# Patient Record
Sex: Female | Born: 1945 | Race: White | Hispanic: No | Smoking: Never smoker
Health system: Southern US, Community
[De-identification: ages and names within clinical notes are randomized; demographics above are authoritative.]

## PROBLEM LIST (undated history)

## (undated) DIAGNOSIS — C801 Malignant (primary) neoplasm, unspecified: Secondary | ICD-10-CM

## (undated) DIAGNOSIS — C50919 Malignant neoplasm of unspecified site of unspecified female breast: Secondary | ICD-10-CM

## (undated) DIAGNOSIS — M1990 Unspecified osteoarthritis, unspecified site: Secondary | ICD-10-CM

## (undated) DIAGNOSIS — N390 Urinary tract infection, site not specified: Secondary | ICD-10-CM

## (undated) DIAGNOSIS — F419 Anxiety disorder, unspecified: Secondary | ICD-10-CM

## (undated) DIAGNOSIS — E079 Disorder of thyroid, unspecified: Secondary | ICD-10-CM

## (undated) DIAGNOSIS — M810 Age-related osteoporosis without current pathological fracture: Secondary | ICD-10-CM

## (undated) HISTORY — DX: Disorder of thyroid, unspecified: E07.9

## (undated) HISTORY — DX: Age-related osteoporosis without current pathological fracture: M81.0

## (undated) HISTORY — PX: TUBAL LIGATION: SHX77

## (undated) HISTORY — DX: Anxiety disorder, unspecified: F41.9

## (undated) HISTORY — PX: TONSILLECTOMY: SUR1361

## (undated) HISTORY — DX: Malignant neoplasm of unspecified site of unspecified female breast: C50.919

## (undated) HISTORY — PX: COLONOSCOPY: SHX174

## (undated) HISTORY — DX: Urinary tract infection, site not specified: N39.0

## (undated) HISTORY — DX: Malignant (primary) neoplasm, unspecified: C80.1

---

## 1991-10-05 DIAGNOSIS — C50919 Malignant neoplasm of unspecified site of unspecified female breast: Secondary | ICD-10-CM

## 1991-10-05 HISTORY — DX: Malignant neoplasm of unspecified site of unspecified female breast: C50.919

## 1991-10-05 HISTORY — PX: BREAST SURGERY: SHX581

## 2008-04-24 ENCOUNTER — Other Ambulatory Visit: Admission: RE | Admit: 2008-04-24 | Discharge: 2008-04-24 | Payer: Self-pay | Admitting: Internal Medicine

## 2008-07-26 ENCOUNTER — Encounter: Admission: RE | Admit: 2008-07-26 | Discharge: 2008-07-26 | Payer: Self-pay | Admitting: Internal Medicine

## 2010-06-16 ENCOUNTER — Encounter: Admission: RE | Admit: 2010-06-16 | Discharge: 2010-06-16 | Payer: Self-pay | Admitting: Obstetrics and Gynecology

## 2012-10-08 ENCOUNTER — Ambulatory Visit: Payer: Managed Care, Other (non HMO) | Admitting: Internal Medicine

## 2012-10-08 VITALS — BP 122/80 | HR 117 | Temp 98.4°F | Resp 16 | Ht 66.0 in | Wt 154.8 lb

## 2012-10-08 DIAGNOSIS — IMO0001 Reserved for inherently not codable concepts without codable children: Secondary | ICD-10-CM

## 2012-10-08 DIAGNOSIS — R350 Frequency of micturition: Secondary | ICD-10-CM

## 2012-10-08 DIAGNOSIS — N390 Urinary tract infection, site not specified: Secondary | ICD-10-CM

## 2012-10-08 DIAGNOSIS — R319 Hematuria, unspecified: Secondary | ICD-10-CM

## 2012-10-08 LAB — POCT URINALYSIS DIPSTICK
Bilirubin, UA: NEGATIVE
Glucose, UA: NEGATIVE
Ketones, UA: NEGATIVE
Nitrite, UA: NEGATIVE
Protein, UA: NEGATIVE
Spec Grav, UA: <=1.005
pH, UA: 6

## 2012-10-08 LAB — POCT UA - MICROSCOPIC ONLY: Yeast, UA: NEGATIVE

## 2012-10-08 MED ORDER — NITROFURANTOIN MONOHYD MACRO 100 MG PO CAPS: 100.0000 mg | ORAL_CAPSULE | Freq: Two times a day (BID) | ORAL | Status: DC

## 2012-10-08 NOTE — Progress Notes (Signed)
  Subjective:    Patient ID: Lydia Roberts, female    DOB: [birth year], 67 y.o.   MRN: [id number]  HPI 2weeks ago pain pelvis =pressure with u freq/suspected a bladder infection which she has had before [place] helped Then today same again, urinary frequency with suprapubic pressure pain/no dysuria/no vaginal discharge  Past medical history-hypothyroid/hyperlipidemia  Review of Systems No fever chills or night sweats/no recent weight loss No flank pain No upper respiratory symptoms   no nausea vomiting diarrhea or constipation Objective:   Physical Exam No acute distress Normal vital signs Abdomen soft. No organomegaly. Mild tenderness in the suprapubic area with negative rebound and negative percussion       Results for orders placed in visit on 10/08/12  POCT URINALYSIS DIPSTICK      Component Value Range   Color, UA pale     Clarity, UA clear     Glucose, UA neg     Bilirubin, UA neg     Ketones, UA neg     Spec Grav, UA <=1.005     Blood, UA small     pH, UA 6.0     Protein, UA neg     Urobilinogen, UA 0.2     Nitrite, UA neg     Leukocytes, UA small (1+)    POCT UA - MICROSCOPIC ONLY      Component Value Range   WBC, Ur, HPF, POC 3-4     RBC, urine, microscopic tntc     Bacteria, U Microscopic 1+     Mucus, UA neg     Epithelial cells, urine per micros neg     Crystals, Ur, HPF, POC neg     Casts, Ur, LPF, POC neg     Yeast, UA neg      Assessment & Plan:   problem #1 hematuria Problem #2 urinary frequency Problem #3 suprapubic pressure  Plan-urine culture Start Macrobid If culture negative will need cystoscopy/CT to evaluate hematuria

## 2012-10-09 LAB — URINE CULTURE

## 2012-10-12 ENCOUNTER — Telehealth: Payer: Self-pay

## 2012-10-12 DIAGNOSIS — R319 Hematuria, unspecified: Secondary | ICD-10-CM

## 2012-10-12 NOTE — Telephone Encounter (Signed)
PATIENT CALLING FOR HER LAB RESULTS - PROMISED ON Tuesday  CBN:  [phone]

## 2012-10-12 NOTE — Telephone Encounter (Signed)
Notes Recorded by Tonye Pearson, MD on 10/10/2012 at 9:49 PM Please call/the urine culture did not establish an infection causing her hematuria. She needs urology evaluation to determine the cause of the blood in her urine. Can we set this up?   Called patient to advise. She is agreeable to the urology referral. She asks for [place] to send records to her PCP Dr Thea Silversmith, this is done.

## 2012-10-19 ENCOUNTER — Emergency Department (HOSPITAL_COMMUNITY)
Admission: EM | Admit: 2012-10-19 | Discharge: 2012-10-20 | Disposition: A | Discharge status: Home / Self Care | Payer: Managed Care, Other (non HMO) | Attending: Emergency Medicine | Admitting: Emergency Medicine

## 2012-10-19 ENCOUNTER — Encounter (HOSPITAL_COMMUNITY): Payer: Self-pay | Admitting: *Deleted

## 2012-10-19 ENCOUNTER — Emergency Department (HOSPITAL_COMMUNITY): Payer: Managed Care, Other (non HMO)

## 2012-10-19 DIAGNOSIS — Z79899 Other long term (current) drug therapy: Secondary | ICD-10-CM | POA: Insufficient documentation

## 2012-10-19 DIAGNOSIS — R109 Unspecified abdominal pain: Secondary | ICD-10-CM | POA: Insufficient documentation

## 2012-10-19 DIAGNOSIS — R110 Nausea: Secondary | ICD-10-CM | POA: Insufficient documentation

## 2012-10-19 DIAGNOSIS — Z8589 Personal history of malignant neoplasm of other organs and systems: Secondary | ICD-10-CM | POA: Insufficient documentation

## 2012-10-19 DIAGNOSIS — E079 Disorder of thyroid, unspecified: Secondary | ICD-10-CM | POA: Insufficient documentation

## 2012-10-19 DIAGNOSIS — Z8739 Personal history of other diseases of the musculoskeletal system and connective tissue: Secondary | ICD-10-CM | POA: Insufficient documentation

## 2012-10-19 LAB — CBC WITH DIFFERENTIAL/PLATELET
Eosinophils Absolute: 0 10*3/uL (ref 0.0–0.7)
Eosinophils Relative: 1 % (ref 0–5)
Lymphocytes Relative: 44 % (ref 12–46)
MCH: 29.6 pg (ref 26.0–34.0)
Neutro Abs: 1.6 10*3/uL — ABNORMAL LOW (ref 1.7–7.7)
Platelets: 173 10*3/uL (ref 150–400)
RBC: 3.95 MIL/uL (ref 3.87–5.11)
RDW: 13.9 % (ref 11.5–15.5)

## 2012-10-19 LAB — URINALYSIS, ROUTINE W REFLEX MICROSCOPIC
Bilirubin Urine: NEGATIVE
Ketones, ur: NEGATIVE mg/dL
Nitrite: NEGATIVE
Specific Gravity, Urine: 1.012 (ref 1.005–1.030)

## 2012-10-19 LAB — URINE MICROSCOPIC-ADD ON

## 2012-10-19 LAB — LIPASE, BLOOD: Lipase: 45 U/L (ref 11–59)

## 2012-10-19 LAB — COMPREHENSIVE METABOLIC PANEL
ALT: 13 U/L (ref 0–35)
AST: 19 U/L (ref 0–37)
Albumin: 3.8 g/dL (ref 3.5–5.2)
Alkaline Phosphatase: 65 U/L (ref 39–117)
BUN: 17 mg/dL (ref 6–23)
CO2: 28 mEq/L (ref 19–32)
Creatinine, Ser: 0.99 mg/dL (ref 0.50–1.10)
Total Protein: 7 g/dL (ref 6.0–8.3)

## 2012-10-19 MED ORDER — KETOROLAC TROMETHAMINE 30 MG/ML IJ SOLN
30.0000 mg | Freq: Once | INTRAMUSCULAR | Status: DC
  Filled 2012-10-19: qty 1

## 2012-10-19 NOTE — ED Provider Notes (Signed)
History     CSN: [id number]  Arrival date & time 10/19/12  2102   First MD Initiated Contact with Patient 10/19/12 2215      Chief Complaint  Patient presents with  . Flank Pain    (Consider location/radiation/quality/duration/timing/severity/associated sxs/prior treatment) Patient is a 67 y.o. female presenting with flank pain. The history is provided by the patient.  Flank Pain  She has been having pain in her right flank with radiation to her right abdomen for the last 3 weeks. Pain is intermittent lasting variable amounts of time. There is associated nausea but no vomiting. She denies dysuria or urinary urgency or frequency or tenesmus. She denies constipation or diarrhea. Nothing makes the pain better nothing makes it worse. She has not taken any medication for it. Pain was worse today than it had been previously. Pain, when present, is moderate to moderately severe. She states the worst pain was 6/10. She had been seen in urgent care Center on January 5 and placed on nitrofurantoin but was told to discontinue that when culture did not show any infection. She does have the bladder with a urologist on February 14. She states that the urine sample at the urgent care Center did have blood in it.  Past Medical History  Diagnosis Date  . Thyroid disease   . Osteoporosis   . Cancer     Past Surgical History  Procedure Date  . Breast surgery   . Tonsillectomy     Family History  Problem Relation Age of Onset  . Diabetes Sister   . Diabetes Brother   . Diabetes Brother   . Thyroid disease Daughter     History  Substance Use Topics  . Smoking status: Never Smoker   . Smokeless tobacco: Not on file  . Alcohol Use: No    OB History    Grav Para Term Preterm Abortions TAB SAB Ect Mult Living                  Review of Systems  Genitourinary: Positive for flank pain.  All other systems reviewed and are negative.    Allergies  Sulfur  Home Medications   Current  Outpatient Rx  Name  Route  Sig  Dispense  Refill  . CALCIUM CARBONATE-VITAMIN D 500-200 MG-UNIT PO TABS   Oral   Take 1 tablet by mouth daily.         Marland Kitchen VITAMIN D 1000 UNITS PO TABS   Oral   Take 1,000 Units by mouth daily.         Marland Kitchen LEVOTHYROXINE SODIUM 125 MCG PO TABS   Oral   Take 125 mcg by mouth daily.         Marland Kitchen PRAVASTATIN SODIUM 20 MG PO TABS   Oral   Take 20 mg by mouth daily.         Marland Kitchen NITROFURANTOIN MONOHYD MACRO 100 MG PO CAPS   Oral   Take 1 capsule (100 mg total) by mouth 2 (two) times daily.   1 capsule   14     BP 137/59  Pulse 60  Temp 98.3 F (36.8 C) (Oral)  Resp 16  SpO2 97%  Physical Exam  Nursing note and vitals reviewed.  67 year old female, resting comfortably and in no acute distress. Vital signs are normal. Oxygen saturation is 97%, which is normal. Head is normocephalic and atraumatic. PERRLA, EOMI. Oropharynx is clear. Neck is nontender and supple without adenopathy or JVD. Back  is nontender and there is no CVA tenderness. Lungs are clear without rales, wheezes, or rhonchi. Chest is nontender. Heart has regular rate and rhythm without murmur. Abdomen is soft, flat, nontender without masses or hepatosplenomegaly and peristalsis is normoactive. Extremities have no cyanosis or edema, full range of motion is present. Skin is warm and dry without rash. Neurologic: Mental status is normal, cranial nerves are intact, there are no motor or sensory deficits.  ED Course  Procedures (including critical care time)  Results for orders placed during the hospital encounter of 10/19/12  URINALYSIS, ROUTINE W REFLEX MICROSCOPIC      Component Value Range   Color, Urine YELLOW  YELLOW   APPearance CLEAR  CLEAR   Specific Gravity, Urine 1.012  1.005 - 1.030   pH 6.0  5.0 - 8.0   Glucose, UA NEGATIVE  NEGATIVE mg/dL   Hgb urine dipstick TRACE (*) NEGATIVE   Bilirubin Urine NEGATIVE  NEGATIVE   Ketones, ur NEGATIVE  NEGATIVE mg/dL    Protein, ur NEGATIVE  NEGATIVE mg/dL   Urobilinogen, UA 0.2  0.0 - 1.0 mg/dL   Nitrite NEGATIVE  NEGATIVE   Leukocytes, UA NEGATIVE  NEGATIVE  CBC WITH DIFFERENTIAL      Component Value Range   WBC 4.0  4.0 - 10.5 K/uL   RBC 3.95  3.87 - 5.11 MIL/uL   Hemoglobin 11.7 (*) 12.0 - 15.0 g/dL   HCT 16.1 (*) 09.6 - 04.5 %   MCV 87.8  78.0 - 100.0 fL   MCH 29.6  26.0 - 34.0 pg   MCHC 33.7  30.0 - 36.0 g/dL   RDW 40.9  81.1 - 91.4 %   Platelets 173  150 - 400 K/uL   Neutrophils Relative 40 (*) 43 - 77 %   Neutro Abs 1.6 (*) 1.7 - 7.7 K/uL   Lymphocytes Relative 44  12 - 46 %   Lymphs Abs 1.8  0.7 - 4.0 K/uL   Monocytes Relative 14 (*) 3 - 12 %   Monocytes Absolute 0.6  0.1 - 1.0 K/uL   Eosinophils Relative 1  0 - 5 %   Eosinophils Absolute 0.0  0.0 - 0.7 K/uL   Basophils Relative 1  0 - 1 %   Basophils Absolute 0.0  0.0 - 0.1 K/uL  COMPREHENSIVE METABOLIC PANEL      Component Value Range   Sodium 138  135 - 145 mEq/L   Potassium 3.9  3.5 - 5.1 mEq/L   Chloride 102  96 - 112 mEq/L   CO2 28  19 - 32 mEq/L   Glucose, Bld 92  70 - 99 mg/dL   BUN 17  6 - 23 mg/dL   Creatinine, Ser 7.82  0.50 - 1.10 mg/dL   Calcium 95.6  8.4 - 21.3 mg/dL   Total Protein 7.0  6.0 - 8.3 g/dL   Albumin 3.8  3.5 - 5.2 g/dL   AST 19  0 - 37 U/L   ALT 13  0 - 35 U/L   Alkaline Phosphatase 65  39 - 117 U/L   Total Bilirubin 1.3 (*) 0.3 - 1.2 mg/dL   GFR calc non Af Amer 58 (*) >90 mL/min   GFR calc Af Amer 67 (*) >90 mL/min  LIPASE, BLOOD      Component Value Range   Lipase 45  11 - 59 U/L  URINE MICROSCOPIC-ADD ON      Component Value Range   Squamous Epithelial / LPF RARE  RARE   Bacteria, UA RARE  RARE   Ct Abdomen Pelvis Wo Contrast  10/19/2012  *RADIOLOGY REPORT*  Clinical Data: Right flank pain, hematuria.  CT ABDOMEN AND PELVIS WITHOUT CONTRAST  Technique:  Multidetector CT imaging of the abdomen and pelvis was performed following the standard protocol without intravenous contrast.  Comparison:  None.  Findings: Visualized lung bases clear.  18 mm probable cyst in the medial left hepatic segment.  Unremarkable noncontrast evaluation of nondistended gallbladder, spleen, adrenal glands, kidneys, pancreas.  No nephrolithiasis or hydronephrosis. Patchy aortoiliac arterial calcifications without aneurysm.  Stomach, small bowel, and colon are nondilated.  Normal appendix.  Multiple sigmoid diverticula without significant adjacent inflammatory/edematous change.  Urinary bladder incompletely distended.  Uterus and adnexal regions grossly unremarkable.  No ascites.  No free air. No adenopathy localized.   Bilateral pelvic phleboliths.  IMPRESSION:  1.  Negative for nephrolithiasis or hydronephrosis. 2.  Normal appendix. 3.  Sigmoid diverticulosis.   Original Report Authenticated By: D. Andria Rhein, MD       1. Right flank pain       MDM  Intermittent right flank pain of uncertain cause. Old records are reviewed and the record from her urgent care visit showed urinalysis with too numerous to count RBCs and 3-4 WBCs. Culture showed 25,000 CFU's with multiple organisms. She will be sent for CT to evaluate for possible ureterolithiasis and she will be given a dose of ketorolac.  Workup is unremarkable. She has not had any further episodes of pain since taking ketorolac. She will be discharged and advised to followup with her urologist as scheduled. I have suggested that she continue taking naproxen to see if keeping NSAID's in her system would help her pain.      Dione Booze, MD 10/19/12 [phone]

## 2012-10-19 NOTE — ED Notes (Signed)
Pt reports right flank pain that comes and goes with pain down her right leg with nausea on and off for about three weeks.

## 2012-10-19 NOTE — ED Notes (Signed)
Patient was seen at urgent care on January 5th.   She was informed that she has blood in her urine.

## 2012-10-20 NOTE — ED Notes (Signed)
Patient is alert and oriented x3.  She was given DC instructions and follow up visit instructions.  Patient gave verbal understanding. She was DC ambulatory under her own power to home.  V/S stable.  SHe was not showing any signs of distress on DC 

## 2012-10-20 NOTE — ED Notes (Signed)
riv

## 2013-10-25 ENCOUNTER — Encounter (HOSPITAL_COMMUNITY): Payer: Self-pay | Admitting: Emergency Medicine

## 2013-10-25 ENCOUNTER — Emergency Department (HOSPITAL_COMMUNITY): Payer: Managed Care, Other (non HMO)

## 2013-10-25 ENCOUNTER — Emergency Department (HOSPITAL_COMMUNITY)
Admission: EM | Admit: 2013-10-25 | Discharge: 2013-10-25 | Disposition: A | Discharge status: Home / Self Care | Payer: Managed Care, Other (non HMO) | Attending: Emergency Medicine | Admitting: Emergency Medicine

## 2013-10-25 DIAGNOSIS — W19XXXA Unspecified fall, initial encounter: Secondary | ICD-10-CM

## 2013-10-25 DIAGNOSIS — N390 Urinary tract infection, site not specified: Secondary | ICD-10-CM

## 2013-10-25 DIAGNOSIS — Z8739 Personal history of other diseases of the musculoskeletal system and connective tissue: Secondary | ICD-10-CM | POA: Insufficient documentation

## 2013-10-25 DIAGNOSIS — Y9289 Other specified places as the place of occurrence of the external cause: Secondary | ICD-10-CM | POA: Insufficient documentation

## 2013-10-25 DIAGNOSIS — Z043 Encounter for examination and observation following other accident: Secondary | ICD-10-CM | POA: Insufficient documentation

## 2013-10-25 DIAGNOSIS — Z853 Personal history of malignant neoplasm of breast: Secondary | ICD-10-CM | POA: Insufficient documentation

## 2013-10-25 DIAGNOSIS — Y939 Activity, unspecified: Secondary | ICD-10-CM | POA: Insufficient documentation

## 2013-10-25 DIAGNOSIS — F411 Generalized anxiety disorder: Secondary | ICD-10-CM | POA: Insufficient documentation

## 2013-10-25 DIAGNOSIS — Z79899 Other long term (current) drug therapy: Secondary | ICD-10-CM | POA: Insufficient documentation

## 2013-10-25 DIAGNOSIS — E079 Disorder of thyroid, unspecified: Secondary | ICD-10-CM | POA: Insufficient documentation

## 2013-10-25 DIAGNOSIS — R296 Repeated falls: Secondary | ICD-10-CM | POA: Insufficient documentation

## 2013-10-25 LAB — URINALYSIS, ROUTINE W REFLEX MICROSCOPIC
BILIRUBIN URINE: NEGATIVE
Glucose, UA: NEGATIVE mg/dL
Ketones, ur: 15 mg/dL — AB
NITRITE: NEGATIVE
Protein, ur: 30 mg/dL — AB
SPECIFIC GRAVITY, URINE: 1.013 (ref 1.005–1.030)
UROBILINOGEN UA: 0.2 mg/dL (ref 0.0–1.0)
pH: 5.5 (ref 5.0–8.0)

## 2013-10-25 LAB — CBC WITH DIFFERENTIAL/PLATELET
Basophils Absolute: 0 10*3/uL (ref 0.0–0.1)
Basophils Relative: 1 % (ref 0–1)
Eosinophils Absolute: 0 10*3/uL (ref 0.0–0.7)
Eosinophils Relative: 0 % (ref 0–5)
HEMATOCRIT: 38.7 % (ref 36.0–46.0)
Hemoglobin: 13.1 g/dL (ref 12.0–15.0)
Lymphocytes Relative: 9 % — ABNORMAL LOW (ref 12–46)
Lymphs Abs: 0.6 10*3/uL — ABNORMAL LOW (ref 0.7–4.0)
MCH: 29.8 pg (ref 26.0–34.0)
MCHC: 33.9 g/dL (ref 30.0–36.0)
MCV: 88.2 fL (ref 78.0–100.0)
Monocytes Absolute: 1.3 10*3/uL — ABNORMAL HIGH (ref 0.1–1.0)
Monocytes Relative: 20 % — ABNORMAL HIGH (ref 3–12)
NEUTROS PCT: 70 % (ref 43–77)
Neutro Abs: 4.6 10*3/uL (ref 1.7–7.7)
PLATELETS: 131 10*3/uL — AB (ref 150–400)
RBC: 4.39 MIL/uL (ref 3.87–5.11)
RDW: 14.1 % (ref 11.5–15.5)
WBC: 6.6 10*3/uL (ref 4.0–10.5)

## 2013-10-25 LAB — COMPREHENSIVE METABOLIC PANEL
ALBUMIN: 3.5 g/dL (ref 3.5–5.2)
ALK PHOS: 72 U/L (ref 39–117)
ALT: 10 U/L (ref 0–35)
AST: 15 U/L (ref 0–37)
BILIRUBIN TOTAL: 1.7 mg/dL — AB (ref 0.3–1.2)
BUN: 14 mg/dL (ref 6–23)
CALCIUM: 9.4 mg/dL (ref 8.4–10.5)
CHLORIDE: 96 meq/L (ref 96–112)
CO2: 25 mEq/L (ref 19–32)
Creatinine, Ser: 0.94 mg/dL (ref 0.50–1.10)
GFR, EST AFRICAN AMERICAN: 71 mL/min — AB (ref >90)
GFR, EST NON AFRICAN AMERICAN: 61 mL/min — AB (ref >90)
GLUCOSE: 105 mg/dL — AB (ref 70–99)
POTASSIUM: 4.4 meq/L (ref 3.7–5.3)
Sodium: 136 mEq/L — ABNORMAL LOW (ref 137–147)
Total Protein: 7.7 g/dL (ref 6.0–8.3)

## 2013-10-25 LAB — URINE MICROSCOPIC-ADD ON

## 2013-10-25 LAB — POCT I-STAT TROPONIN I: Troponin i, poc: 0 ng/mL (ref 0.00–0.08)

## 2013-10-25 MED ORDER — DEXTROSE 5 % IV SOLN
1.0000 g | Freq: Once | INTRAVENOUS | Status: AC
  Administered 2013-10-25: 1 g via INTRAVENOUS
  Filled 2013-10-25: qty 10

## 2013-10-25 MED ORDER — SODIUM CHLORIDE 0.9 % IV SOLN
1000.0000 mL | Freq: Once | INTRAVENOUS | Status: AC
  Administered 2013-10-25: 1000 mL via INTRAVENOUS

## 2013-10-25 MED ORDER — IOHEXOL 300 MG/ML  SOLN
80.0000 mL | Freq: Once | INTRAMUSCULAR | Status: AC | PRN
  Administered 2013-10-25: 80 mL via INTRAVENOUS

## 2013-10-25 MED ORDER — CEPHALEXIN 500 MG PO CAPS: 500.0000 mg | ORAL_CAPSULE | Freq: Two times a day (BID) | ORAL | Status: DC

## 2013-10-25 MED ORDER — SODIUM CHLORIDE 0.9 % IV SOLN
1000.0000 mL | INTRAVENOUS | Status: DC
  Administered 2013-10-25: 1000 mL via INTRAVENOUS

## 2013-10-25 MED ORDER — LORAZEPAM 2 MG/ML IJ SOLN
1.0000 mg | Freq: Once | INTRAMUSCULAR | Status: AC
  Administered 2013-10-25: 1 mg via INTRAVENOUS
  Filled 2013-10-25: qty 1

## 2013-10-25 NOTE — Discharge Instructions (Signed)
As discussed, it is important that you follow up as soon as possible with your physician for continued management of your condition.  Your evaluation here today has been largely reassuring, but additional evaluation is required to ensure appropriate ongoing care.  If you develop any new, or concerning changes in your condition, please return to the emergency department immediately.

## 2013-10-25 NOTE — ED Provider Notes (Signed)
CSN: [id number]     Arrival date & time 10/25/13  0910 History   First MD Initiated Contact with Patient 10/25/13 [phone]     Chief Complaint  Patient presents with  . Near Syncope    HPI  Patient presents with concern of weakness and fall. History of present illness is per the patient and her husband. They state that over the past months, the patient has had persistent weakness, with new difficulty with fine motor skill. No recent episodes of syncope, no focal pain. Patient does have recent urinary tract infection, for which she was treated with ciprofloxacin. She continues to take all medication as directed, including amitriptyline. Today, just prior to ED evaluation the patient had an episode of weakness with fall. She denies head trauma, complete loss of consciousness. The patient was found on the floor by her husband, not complaining of any focal pain. Though the patient states that she did not lose consciousness, she has incomplete recall of the event   Past Medical History  Diagnosis Date  . Thyroid disease   . Osteoporosis   . Cancer    Past Surgical History  Procedure Laterality Date  . Breast surgery    . Tonsillectomy     Family History  Problem Relation Age of Onset  . Diabetes Sister   . Diabetes Brother   . Diabetes Brother   . Thyroid disease Daughter    History  Substance Use Topics  . Smoking status: Never Smoker   . Smokeless tobacco: Not on file  . Alcohol Use: No   OB History   Grav Para Term Preterm Abortions TAB SAB Ect Mult Living                 Review of Systems  Constitutional:       Per HPI, otherwise negative  HENT:       Per HPI, otherwise negative  Respiratory:       Per HPI, otherwise negative  Cardiovascular:       Per HPI, otherwise negative  Gastrointestinal: Negative for vomiting.  Endocrine:       Negative aside from HPI  Genitourinary:       Neg aside from HPI   Musculoskeletal:       Per HPI, otherwise negative   Skin: Negative for wound.  Neurological: Positive for weakness. Negative for seizures and syncope.  Psychiatric/Behavioral: The patient is nervous/anxious.     Allergies  Sulfur  Home Medications   Current Outpatient Rx  Name  Route  Sig  Dispense  Refill  . amitriptyline (ELAVIL) 25 MG tablet   Oral   Take 25 mg by mouth at bedtime.         . cholecalciferol (VITAMIN D) 1000 UNITS tablet   Oral   Take 1,000 Units by mouth daily.         Marland Kitchen ezetimibe (ZETIA) 10 MG tablet   Oral   Take 10 mg by mouth daily.         Marland Kitchen levothyroxine (SYNTHROID, LEVOTHROID) 125 MCG tablet   Oral   Take 125 mcg by mouth daily.          BP 130/42  Pulse 90  Temp(Src) 98.2 F (36.8 C) (Oral)  Resp 18  Ht 5\' 5"  (1.651 m)  Wt 150 lb (68.04 kg)  BMI 24.96 kg/m2  SpO2 99% Physical Exam  Nursing note and vitals reviewed. Constitutional: She is oriented to person, place, and time. She appears well-developed and  well-nourished. She has a sickly appearance.  HENT:  Head: Normocephalic and atraumatic.  Eyes: Conjunctivae and EOM are normal.  Cardiovascular: Normal rate and regular rhythm.   Pulmonary/Chest: Effort normal and breath sounds normal. No stridor. No respiratory distress.  Abdominal: She exhibits no distension.  Musculoskeletal: She exhibits no edema.  Neurological: She is alert and oriented to person, place, and time. She displays atrophy. She displays no tremor. No cranial nerve deficit or sensory deficit. She exhibits normal muscle tone. She displays no seizure activity. Coordination normal.  Coordination is symmetric, but slow  Skin: Skin is warm and dry.  Psychiatric: She has a normal mood and affect. She is slowed and withdrawn.    ED Course  Procedures (including critical care time) Labs Review Labs Reviewed  CBC WITH DIFFERENTIAL  COMPREHENSIVE METABOLIC PANEL  URINALYSIS, ROUTINE W REFLEX MICROSCOPIC   Imaging Review No results found.  EKG Interpretation     Date/Time:  Thursday October 25 2013 09:24:41 EST Ventricular Rate:  89 PR Interval:  159 QRS Duration: 77 QT Interval:  329 QTC Calculation: 400 R Axis:   87 Text Interpretation:  Sinus rhythm Borderline right axis deviation Sinus rhythm Rightward axis Borderline ECG Confirmed by Carmin Muskrat  MD ([id number]) on 10/25/2013 10:11:24 AM           O2- 99%ra, nml   Update: On repeat exam the patient is ambulatory, seems in no distress. MDM   1. UTI (lower urinary tract infection)   2. Fall     Patient presents after a fall, that occurred after a period of generalized fatigue for several weeks. Exam patient is awake, alert, hemodynamically stable.  With her description of new ataxia, though her physical exam was reassuring, she had labs, radiographic studies performed.  Post results were reassuring, though there is evidence of urinary tract infection.  There is also evidence of dehydration with ketonuria.  Patient received fluid rehydration, initiation of antibiotics.  She now decompensation, nor evidence of distress while here.  Patient was discharged in stable condition to follow up with primary care.   Carmin Muskrat, MD 10/25/13 [phone]

## 2013-10-25 NOTE — ED Notes (Signed)
Pt presents to department via [facility] for evaluation of near syncope. States she was in kitchen this morning when she fell to floor. Doesn't remember exact event. Witnessed by husband. Denies LOC. States she has been very weak x2 weeks. Was recently diagnosed with UTI and started on Cipro by PCP. Also states she recently started taking Amitriptyline for depression. Pt is conscious alert and oriented x4. Denies pain at the time. No neurological deficits noted.

## 2013-10-25 NOTE — ED Notes (Signed)
Pt transported to CT scan and x-ray.

## 2013-10-25 NOTE — ED Notes (Signed)
Pt unable to complete CT scan dt anxiety. MD made aware.

## 2014-01-17 ENCOUNTER — Telehealth: Payer: Self-pay | Admitting: *Deleted

## 2014-01-17 ENCOUNTER — Other Ambulatory Visit (HOSPITAL_COMMUNITY)
Admission: RE | Admit: 2014-01-17 | Discharge: 2014-01-17 | Disposition: A | Discharge status: Home / Self Care | Payer: Managed Care, Other (non HMO) | Source: Ambulatory Visit | Attending: Gynecology | Admitting: Gynecology

## 2014-01-17 ENCOUNTER — Encounter: Payer: Self-pay | Admitting: Gynecology

## 2014-01-17 ENCOUNTER — Ambulatory Visit (INDEPENDENT_AMBULATORY_CARE_PROVIDER_SITE_OTHER): Payer: Managed Care, Other (non HMO) | Admitting: Gynecology

## 2014-01-17 VITALS — BP 114/74 | Ht 66.0 in | Wt 144.0 lb

## 2014-01-17 DIAGNOSIS — N390 Urinary tract infection, site not specified: Secondary | ICD-10-CM

## 2014-01-17 DIAGNOSIS — M810 Age-related osteoporosis without current pathological fracture: Secondary | ICD-10-CM

## 2014-01-17 DIAGNOSIS — Z124 Encounter for screening for malignant neoplasm of cervix: Secondary | ICD-10-CM

## 2014-01-17 DIAGNOSIS — Z1151 Encounter for screening for human papillomavirus (HPV): Secondary | ICD-10-CM | POA: Insufficient documentation

## 2014-01-17 DIAGNOSIS — N952 Postmenopausal atrophic vaginitis: Secondary | ICD-10-CM

## 2014-01-17 DIAGNOSIS — Z780 Asymptomatic menopausal state: Secondary | ICD-10-CM

## 2014-01-17 DIAGNOSIS — Z01419 Encounter for gynecological examination (general) (routine) without abnormal findings: Secondary | ICD-10-CM | POA: Insufficient documentation

## 2014-01-17 DIAGNOSIS — Z853 Personal history of malignant neoplasm of breast: Secondary | ICD-10-CM

## 2014-01-17 DIAGNOSIS — N951 Menopausal and female climacteric states: Secondary | ICD-10-CM

## 2014-01-17 NOTE — Progress Notes (Signed)
Lydia Roberts [birth year] [id number]   History:    68 y.o. is a new patient to the practice. Patient was previously been followed by Dr. Ree Edman but has not seen a gynecologist in several years. Her primary physician is Dr. Noah Delaine who has been doing her blood work. Patient 1993 had right breast cancer resulting lumpectomy and radiation. Patient had refused to go on any chemotherapy after the radiation. She continues to do her monthly breast exams and her annual mammograms. Patient states that she had a past history of osteoporosis currently on no medication she is overdue for a bone density study. Patient with no prior history of hormone replacement therapy. The patient had colonoscopy reportedly normal in 2014. She currently takes vitamin C 1000 units daily. Patient denies any prior history of abnormal Pap smears.  Patient as well as her aunt with history of breast cancer she has not had any genetic testing. Past medical history,surgical history, family history and social history were all reviewed and documented in the EPIC chart.  Gynecologic History No LMP recorded. Patient is postmenopausal. Contraception: post menopausal status Pap smear several years ago. Results were: normal Last mammogram: 2014. Results were: normal  Obstetric History OB History  Gravida Para Term Preterm AB SAB TAB Ectopic Multiple Living  3 3        3     # Outcome Date GA Lbr Len/2nd Weight Sex Delivery Anes PTL Lv  3 PAR           2 PAR           1 PAR                ROS: A ROS was performed and pertinent positives and negatives are included in the history.  GENERAL: No fevers or chills. HEENT: No change in vision, no earache, sore throat or sinus congestion. NECK: No pain or stiffness. CARDIOVASCULAR: No chest pain or pressure. No palpitations. PULMONARY: No shortness of breath, cough or wheeze. GASTROINTESTINAL: No abdominal pain, nausea, vomiting or diarrhea, melena or bright red blood per rectum.  GENITOURINARY: No urinary frequency, urgency, hesitancy or dysuria. MUSCULOSKELETAL: No joint or muscle pain, no back pain, no recent trauma. DERMATOLOGIC: No rash, no itching, no lesions. ENDOCRINE: No polyuria, polydipsia, no heat or cold intolerance. No recent change in weight. HEMATOLOGICAL: No anemia or easy bruising or bleeding. NEUROLOGIC: No headache, seizures, numbness, tingling or weakness. PSYCHIATRIC: No depression, no loss of interest in normal activity or change in sleep pattern.     Exam: chaperone present  BP 114/74  Ht 5\' 6"  (1.676 m)  Wt 144 lb (65.318 kg)  BMI 23.25 kg/m2  Body mass index is 23.25 kg/(m^2).  General appearance : Well developed well nourished female. No acute distress HEENT: Neck supple, trachea midline, no carotid bruits, no thyroidmegaly Lungs: Clear to auscultation, no rhonchi or wheezes, or rib retractions  Heart: Regular rate and rhythm, no murmurs or gallops Breast:Examined in sitting and supine position were symmetrical in appearance, no palpable masses or tenderness,  no skin retraction, no nipple inversion, no nipple discharge, no skin discoloration, no axillary or supraclavicular lymphadenopathy Abdomen: no palpable masses or tenderness, no rebound or guarding Extremities: no edema or skin discoloration or tenderness  Pelvic:  Bartholin, Urethra, Skene Glands: Within normal limits             Vagina: No gross lesions or discharge, atrophic changes  Cervix: No gross lesions or discharge  Uterus  anteverted, normal  size, shape and consistency, non-tender and mobile  Adnexa  Without masses or tenderness  Anus and perineum  normal   Rectovaginal  normal sphincter tone without palpated masses or tenderness             Hemoccult PCP provides     Assessment/Plan:  68 y.o. female who states that she has history of osteoporosis currently on no medication besides taking vitamin C 1000 units daily. Will schedule a bone density study here in the  office in the next few weeks. She had her blood work drawn by her PCP this morning. She was reminded her monthly breast exam does get her mammogram for all his of this year. We discussed importance of calcium vitamin D and regular exercise for osteoporosis prevention. Patient recently treated for urinary tract infection. Patient with history of recurrent UTIs. We'll check her urinalysis today.  Note: This dictation was prepared with  Dragon/digital dictation along withSmart phrase technology. Any transcriptional errors that result from this process are unintentional.   Terrance Mass MD, 11:37 AM 01/17/2014

## 2014-01-17 NOTE — Patient Instructions (Signed)
BRCA-1 and BRCA-2 BRCA-1 and BRCA-2 are 2 genes that are linked with hereditary breast and ovarian cancers. About 200,000 women are diagnosed with invasive breast cancer each year and about 23,000 with ovarian cancer (according to the American Cancer Society). Of these cancers, about 5% to 10% will be due to a mutation in one of the BRCA genes. Men can also inherit an increased risk of developing breast cancer, primarily from an alteration in the BRCA-2 gene.  Individuals with mutations in BRCA1 or BRCA2 have significantly elevated risks for breast cancer (up to 80% lifetime risk), ovarian cancer (up to 40% lifetime risk), bilateral breast cancer and other types of cancers. BRCA mutations are inherited and passed from generation to generation. One half of the time, they are passed from the father's side of the family.  The DNA in white blood cells is used to detect mutations in the BRCA genes. While the gene products (proteins) of the BRCA genes act only in breast and ovarian tissue, the genes are present in every cell of the body and blood is the most easily accessible source of that DNA. PREPARATION FOR TEST The test for BRCA mutations is done on a blood sample collected by needle from a vein in the arm. The test does not require surgical biopsy of breast or ovarian tissue.  NORMAL FINDINGS No genetic mutations. Ranges for normal findings may vary among different laboratories and hospitals. You should always check with your doctor after having lab work or other tests done to discuss the meaning of your test results and whether your values are considered within normal limits. MEANING OF TEST  Your caregiver will go over the test results with you and discuss the importance and meaning of your results, as well as treatment options and the need for additional tests if necessary. OBTAINING THE TEST RESULTS It is your responsibility to obtain your test results. Ask the lab or department performing the test  when and how you will get your results. OTHER THINGS TO KNOW Your test results may have implications for other family members. When one member of a family is tested for BRCA mutations, issues often arise about how or whether to share this information with other family members. Seek advice from a genetic counselor about communication of result with your family members.  Pre and post test consultation with a health care provider knowledgeable about genetic testing cannot be overemphasized.  There are many issues to be considered when preparing for a genetic test and upon learning the results, and a genetic counselor has the knowledge and experience to help you sort through them.  If the BRCA test is positive, the options include increased frequency of check-ups (e.g., mammography, blood tests for CA-125, or transvaginal ultrasonography); medications that could reduce risk (e.g., oral contraceptives or tamoxifen); or surgical removal of the ovaries or breasts. There are a number of variables involved and it is important to discuss your options with your doctor and genetic counselor. Research studies have reported that for every 1000 women negative for BRCA mutations, between 12 and 45 of them will develop breast cancer by age 50 and between 3 and 4 will develop ovarian cancer by age 50. The risk increases with age. The test can be ordered by a doctor, preferably by one who can also offer genetic counseling. The blood sample will be sent to a laboratory that specializes in BRCA testing. The American Society of Clinical Oncology and the National Breast Cancer Coalition encourage women seeking the   test to participate in long-term outcome studies to help gather information on the effectiveness of different check-up and treatment options. Document Released: 10/14/2004 Document Revised: 12/13/2011 Document Reviewed: 08/26/2008 ExitCare Patient Information 2014 [employer].  

## 2014-01-17 NOTE — Telephone Encounter (Signed)
Message copied by Thamas Jaegers on Thu Jan 17, 2014  4:02 PM ------      Message from: Terrance Mass      Created: Thu Jan 17, 2014 10:58 AM       Please make appointment at [facility] with Geneticist for Brca testing patient with breast cancer and family history. ------

## 2014-01-17 NOTE — Telephone Encounter (Signed)
Referral faxed to [facility] they will contact patient to schedule.

## 2014-01-17 NOTE — Addendum Note (Signed)
Addended by: Alen Blew on: 01/17/2014 02:50 PM   Modules accepted: Orders

## 2014-01-18 LAB — URINALYSIS W MICROSCOPIC + REFLEX CULTURE
BACTERIA UA: NONE SEEN
Bilirubin Urine: NEGATIVE
CASTS: NONE SEEN
CRYSTALS: NONE SEEN
GLUCOSE, UA: NEGATIVE mg/dL
Hgb urine dipstick: NEGATIVE
KETONES UR: NEGATIVE mg/dL
Leukocytes, UA: NEGATIVE
NITRITE: NEGATIVE
PH: 7 (ref 5.0–8.0)
Protein, ur: NEGATIVE mg/dL
Squamous Epithelial / LPF: NONE SEEN
Urobilinogen, UA: 0.2 mg/dL (ref 0.0–1.0)

## 2014-01-21 ENCOUNTER — Telehealth: Payer: Self-pay | Admitting: Genetic Counselor

## 2014-01-21 NOTE — Telephone Encounter (Signed)
S/W PATIENT AND GVE GENETIC APPT FOR 05/27 @ 9 W/GENETIC COUNSELOR WELCOME PACKET MAILED.

## 2014-01-22 NOTE — Telephone Encounter (Signed)
Appointment 02/27/14 @ 9:00 am

## 2014-02-03 ENCOUNTER — Ambulatory Visit: Payer: Managed Care, Other (non HMO)

## 2014-02-03 ENCOUNTER — Ambulatory Visit (INDEPENDENT_AMBULATORY_CARE_PROVIDER_SITE_OTHER): Payer: Managed Care, Other (non HMO) | Admitting: Internal Medicine

## 2014-02-03 VITALS — BP 120/82 | HR 67 | Temp 98.3°F | Resp 14 | Ht 65.0 in | Wt 145.0 lb

## 2014-02-03 DIAGNOSIS — R0789 Other chest pain: Secondary | ICD-10-CM

## 2014-02-03 DIAGNOSIS — IMO0002 Reserved for concepts with insufficient information to code with codable children: Secondary | ICD-10-CM

## 2014-02-03 DIAGNOSIS — R071 Chest pain on breathing: Secondary | ICD-10-CM

## 2014-02-03 DIAGNOSIS — S2341XA Sprain of ribs, initial encounter: Secondary | ICD-10-CM

## 2014-02-03 NOTE — Progress Notes (Signed)
  This chart was scribed for [employer]. Laney Pastor, MD by Marcha Dutton, ED Scribe. This patient was seen in room 4 and the patient's care was started at 8:43 AM.  Subjective:    Patient ID: Lydia Roberts, female    DOB: 04-28-1946, 68 y.o.   MRN: [id number]  HPI Chief Complaint  Patient presents with  . Rib Pain    pt c/o rt side rib pain x 1 day.     HPI Comments: Lydia Roberts is a 68 y.o. female who presents to the [facility] complaining of rib pain that began yesterday. She reports she was having sex and someone was laying on top of her when she felt a "snap." Pt reports discomfort with breathing. Pt denies pain with arm ROM and with palpation.   Review of Systems  Cardiovascular: Chest pain: rib pain.  Musculoskeletal: Positive for myalgias (rib pain).       Objective:   Physical Exam  Constitutional: She is oriented to person, place, and time. She appears well-developed and well-nourished.  HENT:  Head: Normocephalic and atraumatic.  Neck: Neck supple.  Pulmonary/Chest: Effort normal.  Musculoskeletal: She exhibits tenderness (Tender along the right anterior rib margin w/o defect).  Neurological: She is alert and oriented to person, place, and time. No cranial nerve deficit.  Psychiatric: She has a normal mood and affect. Her behavior is normal.    Triage Vitals: BP 120/82  Pulse 67  Temp(Src) 98.3 F (36.8 C) (Oral)  Resp 14  Ht 5\' 5"  (1.651 m)  Wt 145 lb (65.772 kg)  BMI 24.13 kg/m2  SpO2 98%  [facility] reading (PRIMARY) by  Dr. Glen Blatchley=No Fx of ribs//surgical clips R br area      Assessment & Plan:       Chest wall pain -  Sprain and strain of ribs    .  Heat ibuprof prn  Pt advised of plan for treatment and pt agrees.  I have completed the patient encounter in its entirety as documented by the scribe, with editing by me where necessary. Ezreal Turay P. Laney Pastor, M.D.

## 2014-02-26 ENCOUNTER — Telehealth: Payer: Self-pay | Admitting: *Deleted

## 2014-02-26 NOTE — Telephone Encounter (Signed)
Pt called stating that she does not want to move forward with genetics and wishes to cancel appt.  Cancelled appt as requested.

## 2014-02-27 ENCOUNTER — Other Ambulatory Visit: Payer: Managed Care, Other (non HMO)

## 2014-03-06 ENCOUNTER — Encounter: Payer: Self-pay | Admitting: Women's Health

## 2014-03-06 ENCOUNTER — Ambulatory Visit (INDEPENDENT_AMBULATORY_CARE_PROVIDER_SITE_OTHER): Payer: Managed Care, Other (non HMO) | Admitting: Women's Health

## 2014-03-06 DIAGNOSIS — N390 Urinary tract infection, site not specified: Secondary | ICD-10-CM

## 2014-03-06 DIAGNOSIS — R3915 Urgency of urination: Secondary | ICD-10-CM

## 2014-03-06 LAB — URINALYSIS W MICROSCOPIC + REFLEX CULTURE
Bilirubin Urine: NEGATIVE
CASTS: NONE SEEN
Crystals: NONE SEEN
Glucose, UA: NEGATIVE mg/dL
KETONES UR: NEGATIVE mg/dL
Nitrite: NEGATIVE
PH: 7 (ref 5.0–8.0)
Protein, ur: NEGATIVE mg/dL
Specific Gravity, Urine: 1.01 (ref 1.005–1.030)
Urobilinogen, UA: 0.2 mg/dL (ref 0.0–1.0)

## 2014-03-06 MED ORDER — CIPROFLOXACIN HCL 250 MG PO TABS: 250.0000 mg | ORAL_TABLET | Freq: Two times a day (BID) | ORAL | Status: DC

## 2014-03-06 NOTE — Patient Instructions (Signed)
Urinary Tract Infection  Urinary tract infections (UTIs) can develop anywhere along your urinary tract. Your urinary tract is your body's drainage system for removing wastes and extra water. Your urinary tract includes two kidneys, two ureters, a bladder, and a urethra. Your kidneys are a pair of bean-shaped organs. Each kidney is about the size of your fist. They are located below your ribs, one on each side of your spine.  CAUSES  Infections are caused by microbes, which are microscopic organisms, including fungi, viruses, and bacteria. These organisms are so small that they can only be seen through a microscope. Bacteria are the microbes that most commonly cause UTIs.  SYMPTOMS   Symptoms of UTIs may vary by age and gender of the patient and by the location of the infection. Symptoms in young women typically include a frequent and intense urge to urinate and a painful, burning feeling in the bladder or urethra during urination. Older women and men are more likely to be tired, shaky, and weak and have muscle aches and abdominal pain. A fever may mean the infection is in your kidneys. Other symptoms of a kidney infection include pain in your back or sides below the ribs, nausea, and vomiting.  DIAGNOSIS  To diagnose a UTI, your caregiver will ask you about your symptoms. Your caregiver also will ask to provide a urine sample. The urine sample will be tested for bacteria and white blood cells. White blood cells are made by your body to help fight infection.  TREATMENT   Typically, UTIs can be treated with medication. Because most UTIs are caused by a bacterial infection, they usually can be treated with the use of antibiotics. The choice of antibiotic and length of treatment depend on your symptoms and the type of bacteria causing your infection.  HOME CARE INSTRUCTIONS   If you were prescribed antibiotics, take them exactly as your caregiver instructs you. Finish the medication even if you feel better after you  have only taken some of the medication.   Drink enough water and fluids to keep your urine clear or pale yellow.   Avoid caffeine, tea, and carbonated beverages. They tend to irritate your bladder.   Empty your bladder often. Avoid holding urine for long periods of time.   Empty your bladder before and after sexual intercourse.   After a bowel movement, women should cleanse from front to back. Use each tissue only once.  SEEK MEDICAL CARE IF:    You have back pain.   You develop a fever.   Your symptoms do not begin to resolve within 3 days.  SEEK IMMEDIATE MEDICAL CARE IF:    You have severe back pain or lower abdominal pain.   You develop chills.   You have nausea or vomiting.   You have continued burning or discomfort with urination.  MAKE SURE YOU:    Understand these instructions.   Will watch your condition.   Will get help right away if you are not doing well or get worse.  Document Released: 06/30/2005 Document Revised: 03/21/2012 Document Reviewed: 10/29/2011  ExitCare Patient Information 2014 [employer].

## 2014-03-06 NOTE — Progress Notes (Signed)
Patient ID: MYRTHA TONKOVICH, female   DOB: 10/02/1946, 68 y.o.   MRN: [id number]  Presents with complaint or urinary urgency and frequency x 5 days. Reports RLQ tenderness, dull lower back pain, and anorexia. Denies vaginal itching/discharge, chills or fever. History of UTIs in Jan and Feb 2015 treated with Keflex (Jan) and cipro (Feb) with significant relief.  Exam: Appears well. No CVA tenderness. Tenderness to palpation in RLQ and suprapubic area. UA: Small leukocytes, few bacteria; 11-20 WBC.  UTI  Plan: Cipro 250mg  po BID x 5 days. Instructed to call if symptoms to not improve. Urine culture pending. UTI prevention discussed.

## 2014-03-06 NOTE — Progress Notes (Deleted)
Patient ID: Lydia Roberts, female   DOB: [birth year], 68 y.o.   MRN: [id number] Presents with complaint of urinary urgency x 4 days. Reports RLQ tenderness, urinary frequency, low back pain, anorexia. Denies vaginal itching or discharge, fevers, or chills. Recent UTIs in Jan and Feb 2015, treated with keflex (Jan) and cipro (Feb) with significant relief.    Exam: appears well. No CVA tenderness. Tenderness to palpation in RLQ and suprapubic area.   UA: Few bacteria, 11-20 WBC   UTI  Cipro 250mg  BID x 5 days. Instructed to call if symptoms do not improve. Urine culture pending.

## 2014-03-08 ENCOUNTER — Telehealth: Payer: Self-pay

## 2014-03-08 LAB — URINE CULTURE
COLONY COUNT: NO GROWTH
Organism ID, Bacteria: NO GROWTH

## 2014-03-08 NOTE — Telephone Encounter (Signed)
Patient called. Informed. 

## 2014-03-08 NOTE — Telephone Encounter (Signed)
Finish 3 days and if symptoms better stop.

## 2014-03-08 NOTE — Telephone Encounter (Signed)
Left message to call.

## 2014-03-08 NOTE — Telephone Encounter (Signed)
Patient said she is feeling better. She asked since culture negative should she stop taking the medication?

## 2014-03-08 NOTE — Telephone Encounter (Signed)
Message copied by Ramond Craver on Fri Mar 08, 2014 10:34 AM ------      Message from: [place], Candiss Norse      Created: Fri Mar 08, 2014  7:24 AM       Please call and inform urine culture was negative. Ask if feeling better. Treated for UTI at office visit. ------

## 2014-06-13 ENCOUNTER — Encounter: Payer: Self-pay | Admitting: Gynecology

## 2014-06-13 ENCOUNTER — Other Ambulatory Visit: Payer: Self-pay | Admitting: Radiology

## 2014-06-17 ENCOUNTER — Telehealth: Payer: Self-pay | Admitting: *Deleted

## 2014-06-17 DIAGNOSIS — C50211 Malignant neoplasm of upper-inner quadrant of right female breast: Secondary | ICD-10-CM | POA: Insufficient documentation

## 2014-06-17 NOTE — Telephone Encounter (Signed)
Left vm for pt to return call to give extra instructions for [facility] appt on 06/19/14 at 1200. [facility] gave pt appt at time of dz. Contact information given.

## 2014-06-17 NOTE — Telephone Encounter (Signed)
Confirmed [facility] on 06/19/14 at 1200. Gave instructions and directions. No further needs voiced at this time.

## 2014-06-18 ENCOUNTER — Encounter: Payer: Self-pay | Admitting: Gynecology

## 2014-06-19 ENCOUNTER — Encounter: Payer: Self-pay | Admitting: Hematology and Oncology

## 2014-06-19 ENCOUNTER — Other Ambulatory Visit (HOSPITAL_BASED_OUTPATIENT_CLINIC_OR_DEPARTMENT_OTHER): Payer: Medicare Other

## 2014-06-19 ENCOUNTER — Other Ambulatory Visit (INDEPENDENT_AMBULATORY_CARE_PROVIDER_SITE_OTHER): Payer: Self-pay | Admitting: Surgery

## 2014-06-19 ENCOUNTER — Encounter: Payer: Self-pay | Admitting: General Practice

## 2014-06-19 ENCOUNTER — Ambulatory Visit
Admission: RE | Admit: 2014-06-19 | Discharge: 2014-06-19 | Disposition: A | Discharge status: Home / Self Care | Payer: Managed Care, Other (non HMO) | Source: Ambulatory Visit | Attending: Radiation Oncology | Admitting: Radiation Oncology

## 2014-06-19 ENCOUNTER — Ambulatory Visit: Payer: Medicare Other | Attending: Surgery | Admitting: Physical Therapy

## 2014-06-19 ENCOUNTER — Ambulatory Visit (HOSPITAL_BASED_OUTPATIENT_CLINIC_OR_DEPARTMENT_OTHER): Payer: Medicare Other

## 2014-06-19 ENCOUNTER — Ambulatory Visit (HOSPITAL_BASED_OUTPATIENT_CLINIC_OR_DEPARTMENT_OTHER): Payer: Medicare Other | Admitting: Hematology and Oncology

## 2014-06-19 VITALS — BP 137/58 | HR 71 | Temp 98.6°F | Resp 20 | Ht 65.0 in | Wt 139.3 lb

## 2014-06-19 DIAGNOSIS — R293 Abnormal posture: Secondary | ICD-10-CM | POA: Diagnosis not present

## 2014-06-19 DIAGNOSIS — C50211 Malignant neoplasm of upper-inner quadrant of right female breast: Secondary | ICD-10-CM

## 2014-06-19 DIAGNOSIS — Z170 Estrogen receptor positive status [ER+]: Secondary | ICD-10-CM

## 2014-06-19 DIAGNOSIS — C50219 Malignant neoplasm of upper-inner quadrant of unspecified female breast: Secondary | ICD-10-CM

## 2014-06-19 DIAGNOSIS — C50919 Malignant neoplasm of unspecified site of unspecified female breast: Secondary | ICD-10-CM | POA: Diagnosis not present

## 2014-06-19 DIAGNOSIS — M810 Age-related osteoporosis without current pathological fracture: Secondary | ICD-10-CM | POA: Diagnosis not present

## 2014-06-19 DIAGNOSIS — Z923 Personal history of irradiation: Secondary | ICD-10-CM | POA: Insufficient documentation

## 2014-06-19 DIAGNOSIS — IMO0001 Reserved for inherently not codable concepts without codable children: Secondary | ICD-10-CM | POA: Diagnosis present

## 2014-06-19 LAB — COMPREHENSIVE METABOLIC PANEL (CC13)
ALT: 8 U/L (ref 0–55)
ANION GAP: 9 meq/L (ref 3–11)
AST: 17 U/L (ref 5–34)
Albumin: 4 g/dL (ref 3.5–5.0)
Alkaline Phosphatase: 69 U/L (ref 40–150)
BILIRUBIN TOTAL: 1.46 mg/dL — AB (ref 0.20–1.20)
BUN: 12.4 mg/dL (ref 7.0–26.0)
CALCIUM: 9.7 mg/dL (ref 8.4–10.4)
CHLORIDE: 105 meq/L (ref 98–109)
CO2: 24 meq/L (ref 22–29)
Creatinine: 1.1 mg/dL (ref 0.6–1.1)
GLUCOSE: 183 mg/dL — AB (ref 70–140)
Potassium: 4.2 mEq/L (ref 3.5–5.1)
Sodium: 139 mEq/L (ref 136–145)
Total Protein: 7.6 g/dL (ref 6.4–8.3)

## 2014-06-19 LAB — CBC WITH DIFFERENTIAL/PLATELET
BASO%: 1.1 % (ref 0.0–2.0)
Basophils Absolute: 0.1 10*3/uL (ref 0.0–0.1)
EOS%: 0.8 % (ref 0.0–7.0)
Eosinophils Absolute: 0 10*3/uL (ref 0.0–0.5)
HCT: 39.1 % (ref 34.8–46.6)
HGB: 12.6 g/dL (ref 11.6–15.9)
LYMPH%: 20.9 % (ref 14.0–49.7)
MCH: 28.7 pg (ref 25.1–34.0)
MCHC: 32.4 g/dL (ref 31.5–36.0)
MCV: 88.7 fL (ref 79.5–101.0)
MONO#: 0.4 10*3/uL (ref 0.1–0.9)
MONO%: 6.6 % (ref 0.0–14.0)
NEUT#: 3.8 10*3/uL (ref 1.5–6.5)
NEUT%: 70.6 % (ref 38.4–76.8)
Platelets: 196 10*3/uL (ref 145–400)
RBC: 4.41 10*6/uL (ref 3.70–5.45)
RDW: 14.3 % (ref 11.2–14.5)
WBC: 5.4 10*3/uL (ref 3.9–10.3)
lymph#: 1.1 10*3/uL (ref 0.9–3.3)

## 2014-06-19 NOTE — Progress Notes (Signed)
[facility] NOTE  Patient Care Team: Thressa Sheller, MD as PCP - General (Internal Medicine) Terrance Mass, MD as Referring Physician (Gynecology) Erroll Luna, MD as Consulting Physician (General Surgery) Rulon Eisenmenger, MD as Consulting Physician (Hematology and Oncology) Marye Round, MD as Consulting Physician (Radiation Oncology)  CHIEF COMPLAINTS/PURPOSE OF CONSULTATION:  Newly diagnosed breast cancer  HISTORY OF PRESENTING ILLNESS:  Lydia Roberts 68 y.o. Caucasian female is here because of recent diagnosis of recurrent right breast cancer. Patient has a prior history of right-sided breast cancer diagnosed in 1993 it was treated with lumpectomy and radiation. It is not clear what her stage is and the receptor status at that time. According to patient, she did not require any adjuvant chemotherapy or anti-estrogen therapy.   She had a screening mammogram on 06/12/2014 that revealed a lump in the right breast. She admitted that she felt something underneath that area even prior to that mammogram. It was measured as 1.3 cm and 1:00 position but behind this was another 1.3 cm mass and behind that was a 7 mm mass. 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 The distance between the front to back this lesions was 4.4 cm between the clips. She also has a family history of breast cancer in her paternal aunt age 66 as well as cousin on father's side in her 59s her father was diagnosed with kidney cancer in his 79s. Apart from the bruising and the palpable mass she has no other complaints.  I reviewed her records extensively and collaborated the history with the patient.  SUMMARY OF ONCOLOGIC HISTORY:   Breast cancer of upper-inner quadrant of right female breast   06/12/2014 Mammogram Right  breast architectural distortion (dense breasts); Ultrasound 1.3 cm mass at 1:00 position additional masses behind this mass measuring 1.3 cm and 0.7 cm. 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  She was never exposed to fertility medications or hormone replacement therapy.  She has family history of Breast/GYN/GI cancer  MEDICAL HISTORY:  Past Medical History  Diagnosis Date  . Thyroid disease   . Osteoporosis   . Cancer   . Recurrent UTI   . Breast cancer 1993  . Anxiety     SURGICAL HISTORY: Past Surgical History  Procedure Laterality Date  . Breast surgery    . Tonsillectomy      SOCIAL HISTORY: History   Social History  . Marital Status: Married    Spouse Name: N/A    Number of Children: N/A  . Years of Education: N/A   Occupational History  . Not on file.   Social History Main Topics  . Smoking status: Never Smoker   . Smokeless tobacco: Never Used  . Alcohol Use: No  .  Drug Use: No  . Sexual Activity: Yes    Birth Control/ Protection: None, Post-menopausal   Other Topics Concern  . Not on file   Social History Narrative  . No narrative on file    FAMILY HISTORY: Family History  Problem Relation Age of Onset  . Diabetes Sister   . Diabetes Brother   . Diabetes Brother   . Thyroid disease Daughter   . Cancer Father   . Kidney cancer Father   . Cancer Paternal Aunt   . Breast cancer Paternal Aunt   . Cancer Cousin   . Breast cancer Cousin     ALLERGIES:  is allergic to sulfur and  codeine.  MEDICATIONS:  Current Outpatient Prescriptions  Medication Sig Dispense Refill  . alendronate (FOSAMAX) 70 MG tablet Take 70 mg by mouth once a week. Take with a full glass of water on an empty stomach.      . cholecalciferol (VITAMIN D) 1000 UNITS tablet Take 1,000 Units by mouth daily.      . ciprofloxacin (CIPRO) 250 MG tablet Take 1 tablet (250 mg total) by mouth 2 (two) times daily.  10 tablet  0  . Coconut Oil 1000 MG CAPS Take by mouth.      . ezetimibe (ZETIA) 10 MG tablet Take 10 mg by mouth daily.      Marland Kitchen levothyroxine (SYNTHROID, LEVOTHROID) 125 MCG tablet Take 125 mcg by mouth daily.      Marland Kitchen loratadine (CLARITIN) 10 MG tablet Take 10 mg by mouth daily.       No current facility-administered medications for this visit.    REVIEW OF SYSTEMS:   Constitutional: Denies fevers, chills or abnormal night sweats Eyes: Denies blurriness of vision, double vision or watery eyes Ears, nose, mouth, throat, and face: Denies mucositis or sore throat Respiratory: Denies cough, dyspnea or wheezes Cardiovascular: Denies palpitation, chest discomfort or lower extremity swelling Gastrointestinal:  Denies nausea, heartburn or change in bowel habits Skin: Denies abnormal skin rashes Lymphatics: Denies new lymphadenopathy or easy bruising Neurological:Denies numbness, tingling or new weaknesses Behavioral/Psych: Mood is stable, no new changes  Breast: Palpable mass in the right breast All other systems were reviewed with the patient and are negative.  PHYSICAL EXAMINATION: ECOG PERFORMANCE STATUS: 0 - Asymptomatic  Filed Vitals:   06/19/14 1243  BP: 137/58  Pulse: 71  Temp: 98.6 F (37 C)  Resp: 20   Filed Weights   06/19/14 1243  Weight: 139 lb 4.8 oz (63.186 kg)    GENERAL:alert, no distress and comfortable SKIN: skin color, texture, turgor are normal, no rashes or significant lesions EYES: normal, conjunctiva are pink and non-injected, sclera clear OROPHARYNX:no  exudate, no erythema and lips, buccal mucosa, and tongue normal  NECK: supple, thyroid normal size, non-tender, without nodularity LYMPH:  no palpable lymphadenopathy in the cervical, axillary or inguinal LUNGS: clear to auscultation and percussion with normal breathing effort HEART: regular rate & rhythm and no murmurs and no lower extremity edema ABDOMEN:abdomen soft, non-tender and normal bowel sounds Musculoskeletal:no cyanosis of digits and no clubbing  PSYCH: alert & oriented x 3 with fluent speech NEURO: no focal motor/sensory deficits BREAST: Right breast palpable mass. No palpable axillary or supraclavicular lymphadenopathy  LABORATORY DATA:  I have reviewed the data as listed Lab Results  Component Value Date   WBC 5.4 06/19/2014   HGB 12.6 06/19/2014   HCT 39.1 06/19/2014   MCV 88.7 06/19/2014   PLT 196 06/19/2014   Lab Results  Component Value Date   NA 139 06/19/2014   K 4.2 06/19/2014   CL 96 10/25/2013   CO2 24 06/19/2014    RADIOGRAPHIC STUDIES: I have personally reviewed the radiological reports and agreed with the findings in the report.  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 After reviewing the pathology in detail, we proceeded to discuss the different treatment options between surgery, radiation, chemotherapy, antiestrogen therapies.  Based on multidisciplinary tumor board discussion, our recommendation is for her to get mastectomy and upon followup our plan is to perform Oncotype DX testing to assess whether she needs adjuvant chemotherapy. I explained Oncotype DX testing in great detail and  answered all of her questions regarding that.  Patient would need anti-estrogen therapy in the adjuvant setting and I discussed with her the risks and benefits of aromatase inhibitors. I will discuss this upon seeing her after surgery and much more greater detail.  Because of her personal history and family history of breast cancer, we recommended genetic counseling.  All questions were answered. The patient knows to call the clinic with any problems, questions or concerns. I spent 55 minutes counseling the patient face to face. The total time spent in the appointment was 60 minutes and more than 50% was on counseling.     Rulon Eisenmenger, MD 06/19/2014 3:45 PM

## 2014-06-19 NOTE — Assessment & Plan Note (Signed)
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 After reviewing the pathology in detail, we proceeded to discuss the different treatment options between surgery, radiation, chemotherapy, antiestrogen therapies.  Based on multidisciplinary tumor board discussion, our recommendation is for her to get mastectomy and upon followup our plan is to perform Oncotype DX testing to assess whether she needs adjuvant chemotherapy. I explained Oncotype DX testing in great detail and answered all of her questions regarding that.  Patient would need anti-estrogen therapy in the adjuvant setting and I discussed with her the risks and benefits of aromatase inhibitors. I will discuss this upon seeing her after surgery and much more greater detail.

## 2014-06-19 NOTE — Progress Notes (Signed)
No longer has [employer]. She has my card and I have advised her of alight fund.

## 2014-06-19 NOTE — Progress Notes (Signed)
Checked in new patient with no financial issues prior to seeing the dr. She has [facility] packet and appt crd. She [place] not been out of the country.

## 2014-06-19 NOTE — Progress Notes (Signed)
[place] Psychosocial Distress Screening Spiritual Care  Visited with Lydia Roberts and husband [name] at breast clinic to introduce [place] and [place], and to review distress screen per protocol.  The patient scored a 10 on the Psychosocial Distress Thermometer which indicates severe distress. Provided pastoral presence, reflective listening, encouragement, intro to support resources, and assessed for distress and other psychosocial needs.   ONCBCN DISTRESS SCREENING 06/19/2014  Screening Type Initial Screening  Mark the number that describes how much distress you have been experiencing in the past week 10  Family Problem type Other (comment)  Emotional problem type Nervousness/Anxiety;Adjusting to illness  Spiritual/Religous concerns type Facing my mortality  Referral to clinical social work Yes  Referral to support programs Yes  Other [place], Counseling Interns   During visit, [name] shared that she is "back to normal" ("3 or 4") in terms of stress level, now that she has more information and answers about diagnosis and plan of care.  She notes that there is some family stress related to communication and caring for her 26 year old mother, who has dementia and lives with pt's sister in [place].  Pt also reports that she has osteoporosis, which limits some of her lifting-type activities.    Spiritual Care follow up needed: Yes.   [place] plans to follow up by phone to check in and offer support.  [place], [place]

## 2014-06-19 NOTE — H&P (Signed)
Lydia Roberts 06/19/2014 2:29 PM Location: [facility] Patient #: [id number] DOB: 1946/02/14 Undefined / Language: [name] / Race: Undefined Female History of Present Illness [name] A. Teddie Curd MD; 06/19/2014 2:34 PM) Patient words: pt presents at tthe request of Dr [name] of [facility] for roght breast cancer. Pt has history of right breast lumpectomy and ALND in 1993 for right breast cancer.  The patient is a 68 year old female who presents with breast cancer. 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 Tumor markers include estrogen receptor positive and progesterone receptor positive. No associated conditions are noted. The patient was referred by a specialty consultant. Initial presentation was 3 week(s) ago for breast mass on self-examination. Current diagnosis was determined by mammography, breast ultrasound and core needle biopsy. Treatment has included lumpectomy, axillary dissection and radiation therapy. Symptoms do not include breast pain. Patient describes symptoms as mild. Problem List/Past Medical [name], MD; 06/19/2014 2:37 PM) HYPOTHYROIDISM (244.9  E03.9) HIGH CHOLESTEROL (272.0  E78.0) HISTORY OF CANCER OF RIGHT BREAST (V10.3  Z85.3) lumpectomy/ALND/Radiation 1993  Allergies [name], MD; 06/19/2014 2:38 PM) SULFUR CODEINE  Medication History [name], MD; 06/19/2014 2:39 PM) levothyroxine Active. zetia Active. alendronate Active.  Social History [name], MD; 06/19/2014 2:40 PM) Tobacco use Alcohol use Alcohol Assessment none  Family History [name], MD; 06/19/2014 2:42 PM) Breast cancer First Degree Relatives. Cancer     Review of Systems (Margarett Viti A. Chrishelle Zito MD; 06/19/2014 2:34 PM) General Not Present- Appetite Loss, Chills, Fatigue, Fever, Night Sweats, Weight Gain and Weight Loss. Skin Not Present- Change in Wart/Mole, Dryness, Hives,  Jaundice, New Lesions, Non-Healing Wounds, Rash and Ulcer. HEENT Not Present- Earache, Hearing Loss, Hoarseness, Nose Bleed, Oral Ulcers, Ringing in the Ears, Seasonal Allergies, Sinus Pain, Sore Throat, Visual Disturbances, Wears glasses/contact lenses and Yellow Eyes. Respiratory Not Present- Bloody sputum, Chronic Cough, Difficulty Breathing, Snoring and Wheezing. Breast Not Present- Breast Mass, Breast Pain, Nipple Discharge and Skin Changes. Cardiovascular Not Present- Chest Pain, Difficulty Breathing Lying Down, Leg Cramps, Palpitations, Rapid Heart Rate, Shortness of Breath and Swelling of Extremities. Gastrointestinal Not Present- Abdominal Pain, Bloating, Bloody Stool, Change in Bowel Habits, Chronic diarrhea, Constipation, Difficulty Swallowing, Excessive gas, Gets full quickly at meals, Hemorrhoids, Indigestion, Nausea, Rectal Pain and Vomiting. Female Genitourinary Not Present- Frequency, Nocturia, Painful Urination, Pelvic Pain and Urgency. Musculoskeletal Not Present- Back Pain, Joint Pain, Joint Stiffness, Muscle Pain, Muscle Weakness and Swelling of Extremities. Neurological Not Present- Decreased Memory, Fainting, Headaches, Numbness, Seizures, Tingling, Tremor, Trouble walking and Weakness. Psychiatric Not Present- Anxiety, Bipolar, Change in Sleep Pattern, Depression, Fearful and Frequent crying. Endocrine Not Present- Cold Intolerance, Excessive Hunger, Hair Changes, Heat Intolerance, Hot flashes and New Diabetes. Hematology Not Present- Easy Bruising, Excessive bleeding, Gland problems, HIV and Persistent Infections.   Physical Exam (Sayeed Weatherall A. Krystyna Cleckley MD; 06/19/2014 2:44 PM)  General Mental Status-Alert. General Appearance-Consistent with stated age. Hydration-Well hydrated. Voice-Normal.  Head and Neck Head-normocephalic, atraumatic with no lesions or palpable masses. Trachea-midline. Thyroid Gland Characteristics - normal size and  consistency.  Eye Eyeball - Bilateral-Extraocular movements intact. Sclera/Conjunctiva - Bilateral-No scleral icterus.  Chest and Lung Exam Chest and lung exam reveals -quiet, even and easy respiratory effort with no use of accessory muscles, normal resonance, no flatness or dullness, non-tender and normal tactile fremitus and on auscultation, normal breath sounds, no adventitious sounds and normal vocal resonance. Inspection Chest Wall - Normal.  Back - normal.  Breast Breast - Right-Normal. Breast Lump Right Lower Outer - Consistency - Firm. Mobility - Mobile. Shape - Round. Margin - Ill Defined. Overlying Skin - Normal.  Cardiovascular Cardiovascular examination reveals -on palpation PMI is normal in location and amplitude, no palpable S3 or S4. Normal cardiac borders., normal heart sounds, regular rate and rhythm with no murmurs, carotid auscultation reveals no bruits and normal pedal pulses bilaterally.  Abdomen Inspection Inspection of the abdomen reveals - No Hernias. Skin - Scar - no surgical scars. Palpation/Percussion Palpation and Percussion of the abdomen reveal - Soft, Non Tender, No Rebound tenderness, No Rigidity (guarding) and No hepatosplenomegaly. Auscultation Auscultation of the abdomen reveals - Bowel sounds normal.  Peripheral Vascular Upper Extremity Inspection - Bilateral - Normal - No Clubbing, No Cyanosis, No Edema, Pulses Intact. Palpation - Pulses bilaterally normal. Lower Extremity Palpation - Pulses bilaterally normal.  Neurologic Neurologic evaluation reveals -alert and oriented x 3 with no impairment of recent or remote memory. Mental Status-Normal.  Lymphatic Head & Neck  General Head & Neck Lymphatics: Bilateral - Description - Normal. Axillary  General Axillary Region: Bilateral - Description - Normal. Tenderness - Non Tender. Femoral & Inguinal  Generalized Femoral & Inguinal Lymphatics: Bilateral - Description - Normal.  Tenderness - Non Tender.    Assessment & Plan (Sabiha Sura A. Makell Cyr MD; 06/19/2014 2:51 PM)  BREAST CANCER, RIGHT (174.9  C50.911) Impression: Discussed treatment options for breast cancer to include breast conservation vs mastectomy with reconstruction. Pt has decided on mastectomy. Risk include bleeding, infection, flap necrosis, pain, numbness, recurrence, hematoma, other surgery needs. Pt understands and agrees to proceed with right simple mastectomy.Has ALND in the past.  Current Plans Schedule for Surgery MASTECTOMY, SIMPLE, UNILATERAL (70623) (spent 1 hour with patient and reviewing data)

## 2014-06-20 ENCOUNTER — Other Ambulatory Visit: Payer: Self-pay | Admitting: Gynecology

## 2014-06-20 DIAGNOSIS — R7309 Other abnormal glucose: Secondary | ICD-10-CM

## 2014-06-21 ENCOUNTER — Other Ambulatory Visit: Payer: Medicare Other

## 2014-06-21 ENCOUNTER — Ambulatory Visit (HOSPITAL_BASED_OUTPATIENT_CLINIC_OR_DEPARTMENT_OTHER): Payer: Medicare Other | Admitting: Genetic Counselor

## 2014-06-21 ENCOUNTER — Encounter: Payer: Self-pay | Admitting: Genetic Counselor

## 2014-06-21 DIAGNOSIS — IMO0002 Reserved for concepts with insufficient information to code with codable children: Secondary | ICD-10-CM

## 2014-06-21 DIAGNOSIS — Z803 Family history of malignant neoplasm of breast: Secondary | ICD-10-CM

## 2014-06-21 DIAGNOSIS — C50211 Malignant neoplasm of upper-inner quadrant of right female breast: Secondary | ICD-10-CM

## 2014-06-21 DIAGNOSIS — C50219 Malignant neoplasm of upper-inner quadrant of unspecified female breast: Secondary | ICD-10-CM

## 2014-06-21 LAB — HEMOGLOBIN A1C
HEMOGLOBIN A1C: 5.8 % — AB (ref <5.7)
MEAN PLASMA GLUCOSE: 120 mg/dL — AB (ref <117)

## 2014-06-21 NOTE — Progress Notes (Signed)
Patient Name: Lydia Roberts Patient Age: 68 y.o. Encounter Date: 06/21/2014  Referring Physician: Azzie Almas, MD  Primary Care Provider: Thressa Sheller, MD   Lydia Roberts, a 68 y.o. female, is being seen at the [facility] due to a personal and family history of breast cancer.  She presents to clinic today with her husband to discuss the possibility of a hereditary predisposition to cancer and discuss whether genetic testing is warranted.  HISTORY OF PRESENT ILLNESS: Lydia Roberts was diagnosed with right breast cancer in 1993 at the age of 84. She had a lumpectomy and received radiation. It sounds like this was an ER positive tumor as she was offered Tamoxifen, but states that she declined taking it.  Recently, at age 42, she was diagnoed with another right breast cancer (IDC). The tumor was ER/PR positive and HER2 negative. She stated this was a separate primary. She is planning on having a right mastectomy.  Lydia Roberts stated that a recent colonoscopy in 09/2013 was negative for polyps. She has a yearly gynecologic exam.     Breast cancer of upper-inner quadrant of right female breast   06/12/2014 Mammogram Right breast architectural distortion (dense breasts); Ultrasound 1.3 cm mass at 1:00 position additional masses behind this mass measuring 1.3 cm and 0.7 cm. 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 Thyroid disease   . Osteoporosis   . Cancer   . Recurrent UTI   . Breast cancer 1993  . Anxiety     Past Surgical History  Procedure Laterality Date  . Breast surgery    . Tonsillectomy      History   Social History  . Marital Status: Married    Spouse Name: N/A    Number  of Children: N/A  . Years of Education: N/A   Social History Main Topics  . Smoking status: Never Smoker   . Smokeless tobacco: Never Used  . Alcohol Use: No  . Drug Use: No  . Sexual Activity: Yes    Birth Control/ Protection: None, Post-menopausal   Other Topics Concern  . Not on file   Social History Narrative  . No narrative on file     FAMILY HISTORY: During the visit, a 4-generation pedigree was obtained. Lydia Roberts has two daughters (age 49 and 68) and a son (age 39). She has a sister (age 26) and two brothers (ages 23 and 54). Her mother is 105 and cancer-free and there are no cancers in any maternal relatives.  Lydia Roberts father died at 16 after being diagnosed with kidney cancer at 34. He had a brother and 8 sisters. One of his sisters was diagnosed with breast cancer in her 29s. Lydia Roberts paternal grandmother died at 32 and her paternal grandfather died in his 84s, both cancer-free.  Lydia Roberts ancestry is Caucasian - NOS. There is no known Jewish ancestry and no consanguinity.   ASSESSMENT AND PLAN: Lydia Roberts is a 68 y.o. female with a personal and family history of breast cancer. This history is not highly suggestive of a hereditary predisposition to cancer, even though she has had two primary breast cancers. She has a very large family,  both maternal and paternal. Genetic testing, however, is recommended to rule out a mutation. We reviewed the characteristics, features and inheritance patterns of hereditary cancer syndromes. We also discussed genetic testing, including the process of testing, insurance coverage and implications of results. A negative result will be reassuring for her and her family.  Lydia Roberts wished to pursue genetic testing and a blood sample will be sent to [facility] for analysis of the 17 genes on the BreastNext gene panel. We discussed the implications of a positive, negative and/ or Variant of Uncertain Significance (VUS) result.  Results should be available in approximately 4-5 weeks, at which point we will contact her and address implications for her as well as address genetic testing for at-risk family members, if needed.    We encouraged Lydia Roberts to remain in contact with Cancer Genetics annually so that we can update the family history and inform her of any changes in cancer genetics and testing that may be of benefit for this family. Ms.  Louthan questions were answered to her satisfaction today.   Thank you for the referral and allowing [place] to share in the care of your patient.   The patient was seen for a total of 30 minutes, greater than 50% of which was spent face-to-face counseling. This patient was discussed with the overseeing provider who agrees with the above.   Steele Berg, MS, [place] Certified Genetic Counseor phone: [phone] [email or web address]

## 2014-06-21 NOTE — Progress Notes (Signed)
MD created note during ofc visit copy to pt.  Original to scan.

## 2014-06-24 ENCOUNTER — Telehealth: Payer: Self-pay | Admitting: *Deleted

## 2014-06-24 NOTE — Progress Notes (Signed)
Radiation Oncology         (336) [phone] ________________________________  Name: Lydia Roberts MRN: [id number]  Date: 06/19/2014  DOB: 1946-05-19  HW:[id number],RXYVO, MD  Erroll Luna, MD     REFERRING PHYSICIAN: Erroll Luna, MD   DIAGNOSIS: The encounter diagnosis was Breast cancer of upper-inner quadrant of right female breast.   HISTORY OF PRESENT ILLNESS::Lydia Roberts is a 68 y.o. female who is seen for an initial consultation visit. The patient was seen today in multidisciplinary breast clinic and her case was discussed in conference this morning. She has a history of right-sided breast cancer diagnosed in 1993. The patient underwent a lumpectomy and adjuvant radiation treatment at that time. A recent screening mammogram was completed on 06/12/2014. This revealed a possible abnormality within the right breast. 2 nearby additional areas were also concerning. The patient therefore proceeded to undergo a total of 3 biopsies. Invasive mammary carcinoma at the 12:00 position, invasive mammary carcinoma and DCIS at the 2:00 position, and DCIS centrally were found.  The invasive mammary carcinoma, both, were ER positive, PR positive, and HER-2/neu negative. The distance encompassing these 3 lesions corresponded to 4.4 cm.   PREVIOUS RADIATION THERAPY: Yes as above, adjuvant radiation treatment for prior right-sided breast cancer   PAST MEDICAL HISTORY:  has a past medical history of Thyroid disease; Osteoporosis; Cancer; Recurrent UTI; Breast cancer (1993); and Anxiety.     PAST SURGICAL HISTORY: Past Surgical History  Procedure Laterality Date  . Breast surgery    . Tonsillectomy       FAMILY HISTORY: family history includes Breast cancer in her cousin and paternal aunt; Diabetes in her brother, brother, and sister; Kidney cancer (age of onset: 9) in her father; Thyroid disease in her daughter.   SOCIAL HISTORY:  reports that she has never smoked. She has never used  smokeless tobacco. She reports that she does not drink alcohol or use illicit drugs.   ALLERGIES: Sulfur and Codeine   MEDICATIONS:  Current Outpatient Prescriptions  Medication Sig Dispense Refill  . alendronate (FOSAMAX) 70 MG tablet Take 70 mg by mouth once a week. Take with a full glass of water on an empty stomach.      . cholecalciferol (VITAMIN D) 1000 UNITS tablet Take 1,000 Units by mouth daily.      . ciprofloxacin (CIPRO) 250 MG tablet Take 1 tablet (250 mg total) by mouth 2 (two) times daily.  10 tablet  0  . Coconut Oil 1000 MG CAPS Take by mouth.      . ezetimibe (ZETIA) 10 MG tablet Take 10 mg by mouth daily.      Marland Kitchen levothyroxine (SYNTHROID, LEVOTHROID) 125 MCG tablet Take 125 mcg by mouth daily.      Marland Kitchen loratadine (CLARITIN) 10 MG tablet Take 10 mg by mouth daily.       No current facility-administered medications for this encounter.     REVIEW OF SYSTEMS:  A 15 point review of systems is documented in the electronic medical record. This was obtained by the nursing staff. However, I reviewed this with the patient to discuss relevant findings and make appropriate changes.  Pertinent items are noted in HPI.    PHYSICAL EXAM:  vitals were not taken for this visit.  ECOG = 1  0 - Asymptomatic (Fully active, able to carry on all predisease activities without restriction)  1 - Symptomatic but completely ambulatory (Restricted in physically strenuous activity but ambulatory and able to carry out work of  a light or sedentary nature. For example, light housework, office work)  2 - Symptomatic, <50% in bed during the day (Ambulatory and capable of all self care but unable to carry out any work activities. Up and about more than 50% of waking hours)  3 - Symptomatic, >50% in bed, but not bedbound (Capable of only limited self-care, confined to bed or chair 50% or more of waking hours)  4 - Bedbound (Completely disabled. Cannot carry on any self-care. Totally confined to bed or  chair)  5 - Death   Eustace Pen MM, Creech RH, Tormey DC, et al. [phone]). "Toxicity and response criteria of the [facility]". [facility] 5 (6): 649-55     LABORATORY DATA:  Lab Results  Component Value Date   WBC 5.4 06/19/2014   HGB 12.6 06/19/2014   HCT 39.1 06/19/2014   MCV 88.7 06/19/2014   PLT 196 06/19/2014   Lab Results  Component Value Date   NA 139 06/19/2014   K 4.2 06/19/2014   CL 96 10/25/2013   CO2 24 06/19/2014   Lab Results  Component Value Date   ALT 8 06/19/2014   AST 17 06/19/2014   ALKPHOS 69 06/19/2014   BILITOT 1.46* 06/19/2014      RADIOGRAPHY: No results found.     IMPRESSION: The patient has recurrent right-sided breast cancer. She had 3 lesions which showed a mixture of invasive mammary carcinoma and ductal carcinoma in situ as noted above.  After multidisciplinary discussion, it was felt that the patient benefit from a mastectomy. She also is seeing medical oncology to ascertain the possible benefit of chemotherapy. An Oncotype test is planned.  The patient had prior radiation treatment and therefore with a mastectomy it is not recommended for the patient to undergo further radiation treatment given the current findings/information. I discussed this with the patient and all of her questions were answered.   PLAN: The patient will proceed with surgery and further testing. No plans for radiation treatment.    I spent 20 minutes face to face with the patient and more than 50% of that time was spent in counseling and/or coordination of care.    ________________________________   Jodelle Gross, MD, PhD   **Disclaimer: This note was dictated with voice recognition software. Similar sounding words can inadvertently be transcribed and this note may contain transcription errors which may not have been corrected upon publication of note.**

## 2014-06-24 NOTE — Telephone Encounter (Signed)
Spoke to pt concerning [place] from 06/19/14. Pt denies questions or concerns regarding dx or treatment care plan. Pt request to meet with SW to discuss advanced directives and living. She would also like to have an [place] to discuss her dx and treatment. I have made a referral for both requests. Encourage pt to call with needs. Received verbal understanding. Contact information given.

## 2014-06-26 ENCOUNTER — Other Ambulatory Visit: Payer: Self-pay | Admitting: Gynecology

## 2014-06-26 ENCOUNTER — Ambulatory Visit (INDEPENDENT_AMBULATORY_CARE_PROVIDER_SITE_OTHER): Payer: Medicare Other | Admitting: Gynecology

## 2014-06-26 ENCOUNTER — Encounter: Payer: Self-pay | Admitting: Gynecology

## 2014-06-26 ENCOUNTER — Ambulatory Visit (INDEPENDENT_AMBULATORY_CARE_PROVIDER_SITE_OTHER): Payer: Medicare Other

## 2014-06-26 VITALS — BP 130/84

## 2014-06-26 DIAGNOSIS — N9489 Other specified conditions associated with female genital organs and menstrual cycle: Secondary | ICD-10-CM

## 2014-06-26 DIAGNOSIS — Z853 Personal history of malignant neoplasm of breast: Secondary | ICD-10-CM

## 2014-06-26 DIAGNOSIS — R1900 Intra-abdominal and pelvic swelling, mass and lump, unspecified site: Secondary | ICD-10-CM

## 2014-06-26 DIAGNOSIS — N858 Other specified noninflammatory disorders of uterus: Secondary | ICD-10-CM

## 2014-06-26 DIAGNOSIS — R339 Retention of urine, unspecified: Secondary | ICD-10-CM

## 2014-06-26 DIAGNOSIS — N83339 Acquired atrophy of ovary and fallopian tube, unspecified side: Secondary | ICD-10-CM

## 2014-06-26 DIAGNOSIS — Z23 Encounter for immunization: Secondary | ICD-10-CM

## 2014-06-26 DIAGNOSIS — N898 Other specified noninflammatory disorders of vagina: Secondary | ICD-10-CM

## 2014-06-26 DIAGNOSIS — N859 Noninflammatory disorder of uterus, unspecified: Secondary | ICD-10-CM

## 2014-06-26 LAB — URINALYSIS W MICROSCOPIC + REFLEX CULTURE
BILIRUBIN URINE: NEGATIVE
CASTS: NONE SEEN
CRYSTALS: NONE SEEN
GLUCOSE, UA: NEGATIVE mg/dL
KETONES UR: NEGATIVE mg/dL
Nitrite: NEGATIVE
PH: 6.5 (ref 5.0–8.0)
Protein, ur: NEGATIVE mg/dL
UROBILINOGEN UA: 0.2 mg/dL (ref 0.0–1.0)

## 2014-06-26 LAB — WET PREP FOR TRICH, YEAST, CLUE
Clue Cells Wet Prep HPF POC: NONE SEEN
Trich, Wet Prep: NONE SEEN
Yeast Wet Prep HPF POC: NONE SEEN

## 2014-06-26 NOTE — Patient Instructions (Addendum)
BRCA-1 and BRCA-2 BRCA-1 and BRCA-2 are 2 genes that are linked with hereditary breast and ovarian cancers. About 200,000 women are diagnosed with invasive breast cancer each year and about 23,000 with ovarian cancer (according to the American Cancer Society). Of these cancers, about 5% to 10% will be due to a mutation in one of the BRCA genes. Men can also inherit an increased risk of developing breast cancer, primarily from an alteration in the BRCA-2 gene.  Individuals with mutations in BRCA1 or BRCA2 have significantly elevated risks for breast cancer (up to 80% lifetime risk), ovarian cancer (up to 40% lifetime risk), bilateral breast cancer and other types of cancers. BRCA mutations are inherited and passed from generation to generation. One half of the time, they are passed from the father's side of the family.  The DNA in white blood cells is used to detect mutations in the BRCA genes. While the gene products (proteins) of the BRCA genes act only in breast and ovarian tissue, the genes are present in every cell of the body and blood is the most easily accessible source of that DNA. PREPARATION FOR TEST The test for BRCA mutations is done on a blood sample collected by needle from a vein in the arm. The test does not require surgical biopsy of breast or ovarian tissue.  NORMAL FINDINGS No genetic mutations. Ranges for normal findings may vary among different laboratories and hospitals. You should always check with your doctor after having lab work or other tests done to discuss the meaning of your test results and whether your values are considered within normal limits. MEANING OF TEST  Your caregiver will go over the test results with you and discuss the importance and meaning of your results, as well as treatment options and the need for additional tests if necessary. OBTAINING THE TEST RESULTS It is your responsibility to obtain your test results. Ask the lab or department performing the test  when and how you will get your results. OTHER THINGS TO KNOW Your test results may have implications for other family members. When one member of a family is tested for BRCA mutations, issues often arise about how or whether to share this information with other family members. Seek advice from a genetic counselor about communication of result with your family members.  Pre and post test consultation with a health care provider knowledgeable about genetic testing cannot be overemphasized.  There are many issues to be considered when preparing for a genetic test and upon learning the results, and a genetic counselor has the knowledge and experience to help you sort through them.  If the BRCA test is positive, the options include increased frequency of check-ups (e.g., mammography, blood tests for CA-125, or transvaginal ultrasonography); medications that could reduce risk (e.g., oral contraceptives or tamoxifen); or surgical removal of the ovaries or breasts. There are a number of variables involved and it is important to discuss your options with your doctor and genetic counselor. Research studies have reported that for every 1000 women negative for BRCA mutations, between 12 and 45 of them will develop breast cancer by age 50 and between 3 and 4 will develop ovarian cancer by age 50. The risk increases with age. The test can be ordered by a doctor, preferably by one who can also offer genetic counseling. The blood sample will be sent to a laboratory that specializes in BRCA testing. The American Society of Clinical Oncology and the National Breast Cancer Coalition encourage women seeking the   test to participate in long-term outcome studies to help gather information on the effectiveness of different check-up and treatment options. Document Released: 10/14/2004 Document Revised: 12/13/2011 Document Reviewed: 12/21/2013 [facility] Patient Information 2015 [place], [place]. This information is not intended to  replace advice given to you by your health care provider. Make sure you discuss any questions you have with your health care provider. Influenza Virus Vaccine injection (Fluarix) What is this medicine? INFLUENZA VIRUS VACCINE (in floo EN zuh VAHY ruhs vak SEEN) helps to reduce the risk of getting influenza also known as the flu. This medicine may be used for other purposes; ask your health care provider or pharmacist if you have questions. COMMON BRAND NAME(S): Fluarix, Fluzone What should I tell my health care provider before I take this medicine? They need to know if you have any of these conditions: -bleeding disorder like hemophilia -fever or infection -Guillain-Barre syndrome or other neurological problems -immune system problems -infection with the human immunodeficiency virus (HIV) or AIDS -low blood platelet counts -multiple sclerosis -an unusual or allergic reaction to influenza virus vaccine, eggs, chicken proteins, latex, gentamicin, other medicines, foods, dyes or preservatives -pregnant or trying to get pregnant -breast-feeding How should I use this medicine? This vaccine is for injection into a muscle. It is given by a health care professional. A copy of Vaccine Information Statements will be given before each vaccination. Read this sheet carefully each time. The sheet may change frequently. Talk to your pediatrician regarding the use of this medicine in children. Special care may be needed. Overdosage: If you think you have taken too much of this medicine contact a poison control center or emergency room at once. NOTE: This medicine is only for you. Do not share this medicine with others. What if I miss a dose? This does not apply. What may interact with this medicine? -chemotherapy or radiation therapy -medicines that lower your immune system like etanercept, anakinra, infliximab, and adalimumab -medicines that treat or prevent blood clots like  warfarin -phenytoin -steroid medicines like prednisone or cortisone -theophylline -vaccines This list may not describe all possible interactions. Give your health care provider a list of all the medicines, herbs, non-prescription drugs, or dietary supplements you use. Also tell them if you smoke, drink alcohol, or use illegal drugs. Some items may interact with your medicine. What should I watch for while using this medicine? Report any side effects that do not go away within 3 days to your doctor or health care professional. Call your health care provider if any unusual symptoms occur within 6 weeks of receiving this vaccine. You may still catch the flu, but the illness is not usually as bad. You cannot get the flu from the vaccine. The vaccine will not protect against colds or other illnesses that may cause fever. The vaccine is needed every year. What side effects may I notice from receiving this medicine? Side effects that you should report to your doctor or health care professional as soon as possible: -allergic reactions like skin rash, itching or hives, swelling of the face, lips, or tongue Side effects that usually do not require medical attention (report to your doctor or health care professional if they continue or are bothersome): -fever -headache -muscle aches and pains -pain, tenderness, redness, or swelling at site where injected -weak or tired This list may not describe all possible side effects. Call your doctor for medical advice about side effects. You may report side effects to FDA at 1-800-FDA-1088. Where should I keep  my medicine? This vaccine is only given in a clinic, pharmacy, doctor's office, or other health care setting and will not be stored at home. NOTE: This sheet is a summary. It may not cover all possible information. If you have questions about this medicine, talk to your doctor, pharmacist, or health care provider.  2015, Elsevier/Gold Standard. (2008-04-17  09:30:40)

## 2014-06-26 NOTE — Progress Notes (Signed)
   68 year old patient presented to the office today complaining of some pelvic pressure but no dysuria or frequency. No GU complaints. Patient states she may have a vaginal odor but describes no discharge. Patient recently diagnosed with her second breast cancer in the same breast after many years. She is in the process of getting her mastectomy scheduled and further treatment. Patient on hormone replacement therapy.  Exam: Bartholin's urethra Skene's is within normal limits Vagina: No lesions or discharge, atrophic changes Cervix: No lesions or discharge Uterus anteverted normal size shape and consistency Adnexa: Difficult examining the left adnexa nontender Rectal exam not done   wet prep: Many white blood cells many bacteria  Ultrasound today: Uterus measures 5.8 x 5.2 x 2.6 cm with endometrial stripe at 2.4 mm. Right and left ovary atrophic. No fluid in the cul-de-sac. Small amount of fluid was describing the intrauterine cavity.  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  Apparent bacterial vaginosis will be treated with Cleocin vaginal cream to apply twice a day for 5-7 days  Patient received the flu vaccine today  We'll await results of urine culture.

## 2014-06-27 ENCOUNTER — Telehealth: Payer: Self-pay | Admitting: *Deleted

## 2014-06-27 MED ORDER — CLINDAMYCIN PHOSPHATE 2 % VA CREA: TOPICAL_CREAM | VAGINAL | Status: DC

## 2014-06-27 NOTE — Telephone Encounter (Signed)
Pt Rx from [facility] 06/26/14 was never sent to pharmacy Rx for Cleocin vaginal cream.

## 2014-06-28 LAB — URINE CULTURE: Colony Count: 6000

## 2014-07-05 ENCOUNTER — Telehealth: Payer: Self-pay | Admitting: *Deleted

## 2014-07-05 NOTE — Telephone Encounter (Signed)
Spoke with patient and confirmed appointment with Dr. Lindi Adie for 08/13/14 at 815am.

## 2014-07-12 ENCOUNTER — Encounter: Payer: Self-pay | Admitting: General Practice

## 2014-07-18 ENCOUNTER — Encounter (HOSPITAL_BASED_OUTPATIENT_CLINIC_OR_DEPARTMENT_OTHER): Payer: Self-pay | Admitting: *Deleted

## 2014-07-18 NOTE — Progress Notes (Signed)
To come in for labs  Bring all meds and overnight bag-

## 2014-07-19 ENCOUNTER — Encounter (HOSPITAL_BASED_OUTPATIENT_CLINIC_OR_DEPARTMENT_OTHER)
Admission: RE | Admit: 2014-07-19 | Discharge: 2014-07-19 | Disposition: A | Discharge status: Home / Self Care | Payer: Medicare Other | Source: Ambulatory Visit | Attending: Surgery | Admitting: Surgery

## 2014-07-19 DIAGNOSIS — Z170 Estrogen receptor positive status [ER+]: Secondary | ICD-10-CM | POA: Diagnosis not present

## 2014-07-19 DIAGNOSIS — C50911 Malignant neoplasm of unspecified site of right female breast: Secondary | ICD-10-CM | POA: Diagnosis not present

## 2014-07-19 DIAGNOSIS — Z882 Allergy status to sulfonamides status: Secondary | ICD-10-CM | POA: Diagnosis not present

## 2014-07-19 DIAGNOSIS — Z79899 Other long term (current) drug therapy: Secondary | ICD-10-CM | POA: Diagnosis not present

## 2014-07-19 DIAGNOSIS — E039 Hypothyroidism, unspecified: Secondary | ICD-10-CM | POA: Diagnosis not present

## 2014-07-19 DIAGNOSIS — Z803 Family history of malignant neoplasm of breast: Secondary | ICD-10-CM | POA: Diagnosis not present

## 2014-07-19 DIAGNOSIS — Z885 Allergy status to narcotic agent status: Secondary | ICD-10-CM | POA: Diagnosis not present

## 2014-07-19 LAB — COMPREHENSIVE METABOLIC PANEL
ALK PHOS: 58 U/L (ref 39–117)
ALT: 12 U/L (ref 0–35)
AST: 17 U/L (ref 0–37)
Albumin: 4 g/dL (ref 3.5–5.2)
Anion gap: 11 (ref 5–15)
BILIRUBIN TOTAL: 1.4 mg/dL — AB (ref 0.3–1.2)
BUN: 13 mg/dL (ref 6–23)
CHLORIDE: 103 meq/L (ref 96–112)
CO2: 28 mEq/L (ref 19–32)
Calcium: 9.8 mg/dL (ref 8.4–10.5)
Creatinine, Ser: 0.96 mg/dL (ref 0.50–1.10)
GFR, EST AFRICAN AMERICAN: 69 mL/min — AB (ref >90)
GFR, EST NON AFRICAN AMERICAN: 59 mL/min — AB (ref >90)
GLUCOSE: 121 mg/dL — AB (ref 70–99)
POTASSIUM: 4.5 meq/L (ref 3.7–5.3)
SODIUM: 142 meq/L (ref 137–147)
Total Protein: 7.5 g/dL (ref 6.0–8.3)

## 2014-07-19 LAB — CBC WITH DIFFERENTIAL/PLATELET
Basophils Absolute: 0 10*3/uL (ref 0.0–0.1)
Basophils Relative: 1 % (ref 0–1)
Eosinophils Absolute: 0 10*3/uL (ref 0.0–0.7)
Eosinophils Relative: 1 % (ref 0–5)
HCT: 39 % (ref 36.0–46.0)
HEMOGLOBIN: 12.8 g/dL (ref 12.0–15.0)
LYMPHS ABS: 1.4 10*3/uL (ref 0.7–4.0)
LYMPHS PCT: 36 % (ref 12–46)
MCH: 29.5 pg (ref 26.0–34.0)
MCHC: 32.8 g/dL (ref 30.0–36.0)
MCV: 89.9 fL (ref 78.0–100.0)
Monocytes Absolute: 0.5 10*3/uL (ref 0.1–1.0)
Monocytes Relative: 12 % (ref 3–12)
NEUTROS PCT: 50 % (ref 43–77)
Neutro Abs: 1.9 10*3/uL (ref 1.7–7.7)
PLATELETS: 188 10*3/uL (ref 150–400)
RBC: 4.34 MIL/uL (ref 3.87–5.11)
RDW: 13.9 % (ref 11.5–15.5)
WBC: 3.8 10*3/uL — AB (ref 4.0–10.5)

## 2014-07-23 ENCOUNTER — Encounter: Payer: Self-pay | Admitting: Genetic Counselor

## 2014-07-23 NOTE — Progress Notes (Signed)
GENETIC TEST RESULTS  Patient Name: Lydia Roberts Patient Age: 68 y.o. Encounter Date: 07/23/2014  Referring Physician: Nicholas Lose, MD   Ms. Carda was called today to discuss genetic test results. Please see the Genetics note from her visit on 06/21/14 for a detailed discussion of her personal and family history.  GENETIC TESTING: At the time of Ms. Tompkins's visit, we recommended she pursue genetic testing of multiple genes on the BreastNext gene panel. This test, which included sequencing and deletion/duplication analysis of 17 genes, was performed at [facility]. Testing was normal and did not reveal a mutation in these genes. The genes tested were ATM, BARD1, BRCA1, BRCA2, BRIP1, CDH1, CHEK2, MRE11A, MUTYH, NBN, NF1, PALB2, PTEN, RAD50, RAD51C, RAD51D, and TP53.  We discussed with Ms. Kriz that since the current test is not perfect, it is possible there may be a gene mutation that current testing cannot detect, but that chance is small. We also discussed that it is possible that a different genetic factor, which was not part of this testing or has not yet been discovered, is responsible for the cancer diagnoses in the family. Again, the chance for this is low. Should Ms. Henandez wish to discuss or pursue this additional testing, we are happy to coordinate this at any time, but do not feel that she is at significant risk of harboring a mutation in a different gene.     CANCER SCREENING: This result suggests that Ms. Dunnaway's cancer was most likely not due to an inherited predisposition. Most cancers happen by chance and this negative test, along with details of her family history, suggests that her cancer falls into this category. We, therefore, recommended she continue to follow the cancer screening guidelines provided by her physician.   FAMILY MEMBERS: Women in the family are at some increased risk of developing breast cancer, over the general population risk, simply due to  the family history. We recommended they have a yearly mammogram, a yearly clinical breast exam, and perform monthly breast self-exams. A gynecologic exam is recommended yearly. Colon cancer screening is recommended to begin by age 47.  Lastly, we discussed with Ms. Dripps that cancer genetics is a rapidly advancing field and it is possible that new genetic tests will be appropriate for her in the future. We encouraged her to remain in contact with [place] on an annual basis so we can update her personal and family histories, and let her know of advances in cancer genetics that may benefit the family. Our contact number was provided. Ms. Durio questions were answered to her satisfaction today, and she knows she is welcome to call anytime with additional questions.    Steele Berg, MS, [place] Certified Genetic Counseor phone: [phone] [email or web address]

## 2014-07-25 ENCOUNTER — Ambulatory Visit (HOSPITAL_BASED_OUTPATIENT_CLINIC_OR_DEPARTMENT_OTHER): Payer: Medicare Other | Admitting: Anesthesiology

## 2014-07-25 ENCOUNTER — Ambulatory Visit (HOSPITAL_BASED_OUTPATIENT_CLINIC_OR_DEPARTMENT_OTHER)
Admission: RE | Admit: 2014-07-25 | Discharge: 2014-07-26 | Disposition: A | Discharge status: Home / Self Care | Payer: Medicare Other | Source: Ambulatory Visit | Attending: Surgery | Admitting: Surgery

## 2014-07-25 ENCOUNTER — Encounter (HOSPITAL_BASED_OUTPATIENT_CLINIC_OR_DEPARTMENT_OTHER): Payer: Self-pay

## 2014-07-25 ENCOUNTER — Encounter (HOSPITAL_BASED_OUTPATIENT_CLINIC_OR_DEPARTMENT_OTHER)
Admission: RE | Disposition: A | Discharge status: Home / Self Care | Payer: Self-pay | Source: Ambulatory Visit | Attending: Surgery

## 2014-07-25 ENCOUNTER — Encounter (HOSPITAL_BASED_OUTPATIENT_CLINIC_OR_DEPARTMENT_OTHER): Payer: Medicare Other | Admitting: Anesthesiology

## 2014-07-25 DIAGNOSIS — C50211 Malignant neoplasm of upper-inner quadrant of right female breast: Secondary | ICD-10-CM

## 2014-07-25 DIAGNOSIS — E039 Hypothyroidism, unspecified: Secondary | ICD-10-CM | POA: Diagnosis not present

## 2014-07-25 DIAGNOSIS — Z170 Estrogen receptor positive status [ER+]: Secondary | ICD-10-CM | POA: Insufficient documentation

## 2014-07-25 DIAGNOSIS — C50919 Malignant neoplasm of unspecified site of unspecified female breast: Secondary | ICD-10-CM | POA: Diagnosis present

## 2014-07-25 DIAGNOSIS — Z882 Allergy status to sulfonamides status: Secondary | ICD-10-CM | POA: Insufficient documentation

## 2014-07-25 DIAGNOSIS — Z79899 Other long term (current) drug therapy: Secondary | ICD-10-CM | POA: Insufficient documentation

## 2014-07-25 DIAGNOSIS — C50911 Malignant neoplasm of unspecified site of right female breast: Secondary | ICD-10-CM | POA: Diagnosis not present

## 2014-07-25 DIAGNOSIS — Z885 Allergy status to narcotic agent status: Secondary | ICD-10-CM | POA: Diagnosis not present

## 2014-07-25 DIAGNOSIS — Z853 Personal history of malignant neoplasm of breast: Secondary | ICD-10-CM

## 2014-07-25 DIAGNOSIS — Z803 Family history of malignant neoplasm of breast: Secondary | ICD-10-CM | POA: Insufficient documentation

## 2014-07-25 HISTORY — PX: SIMPLE MASTECTOMY WITH AXILLARY SENTINEL NODE BIOPSY: SHX6098

## 2014-07-25 HISTORY — DX: Unspecified osteoarthritis, unspecified site: M19.90

## 2014-07-25 SURGERY — SIMPLE MASTECTOMY
Anesthesia: Regional | Site: Breast | Laterality: Right

## 2014-07-25 MED ORDER — DEXTROSE-NACL 5-0.9 % IV SOLN: INTRAVENOUS | Status: DC

## 2014-07-25 MED ORDER — SCOPOLAMINE 1 MG/3DAYS TD PT72
1.0000 | MEDICATED_PATCH | Freq: Once | TRANSDERMAL | Status: DC
  Administered 2014-07-25: 1.5 mg via TRANSDERMAL

## 2014-07-25 MED ORDER — DEXAMETHASONE SODIUM PHOSPHATE 4 MG/ML IJ SOLN
INTRAMUSCULAR | Status: DC | PRN
  Administered 2014-07-25: 10 mg via INTRAVENOUS

## 2014-07-25 MED ORDER — MIDAZOLAM HCL 2 MG/2ML IJ SOLN
INTRAMUSCULAR | Status: AC
Start: 2014-07-25 — End: 2014-07-25
  Filled 2014-07-25: qty 2

## 2014-07-25 MED ORDER — CEFAZOLIN SODIUM-DEXTROSE 2-3 GM-% IV SOLR
2.0000 g | INTRAVENOUS | Status: AC
  Administered 2014-07-25: 2 g via INTRAVENOUS

## 2014-07-25 MED ORDER — MIDAZOLAM HCL 2 MG/2ML IJ SOLN
1.0000 mg | INTRAMUSCULAR | Status: DC | PRN
  Administered 2014-07-25: 1 mg via INTRAVENOUS

## 2014-07-25 MED ORDER — HYDROCODONE-ACETAMINOPHEN 5-325 MG PO TABS: 1.0000 | ORAL_TABLET | ORAL | Status: DC | PRN

## 2014-07-25 MED ORDER — SODIUM CHLORIDE 0.9 % IJ SOLN: 3.0000 mL | INTRAMUSCULAR | Status: DC | PRN

## 2014-07-25 MED ORDER — MIDAZOLAM HCL 5 MG/5ML IJ SOLN
INTRAMUSCULAR | Status: DC | PRN
  Administered 2014-07-25: 2 mg via INTRAVENOUS

## 2014-07-25 MED ORDER — SODIUM CHLORIDE 0.9 % IV SOLN
250.0000 mL | INTRAVENOUS | Status: DC | PRN
  Administered 2014-07-25: 10 mL/h via INTRAVENOUS

## 2014-07-25 MED ORDER — SODIUM CHLORIDE 0.9 % IR SOLN
Status: DC | PRN
  Administered 2014-07-25: 1000 mL

## 2014-07-25 MED ORDER — BUPIVACAINE-EPINEPHRINE (PF) 0.25% -1:200000 IJ SOLN
INTRAMUSCULAR | Status: AC
  Filled 2014-07-25: qty 30

## 2014-07-25 MED ORDER — SODIUM CHLORIDE 0.9 % IJ SOLN: 3.0000 mL | Freq: Two times a day (BID) | INTRAMUSCULAR | Status: DC

## 2014-07-25 MED ORDER — HYDROMORPHONE HCL 1 MG/ML IJ SOLN
0.2500 mg | INTRAMUSCULAR | Status: DC | PRN
  Administered 2014-07-25: 0.5 mg via INTRAVENOUS
  Filled 2014-07-25: qty 1

## 2014-07-25 MED ORDER — SUFENTANIL CITRATE 50 MCG/ML IV SOLN
INTRAVENOUS | Status: AC
  Filled 2014-07-25: qty 1

## 2014-07-25 MED ORDER — SUFENTANIL CITRATE 50 MCG/ML IV SOLN
INTRAVENOUS | Status: DC | PRN
  Administered 2014-07-25 (×2): 5 ug via INTRAVENOUS

## 2014-07-25 MED ORDER — BUPIVACAINE HCL (PF) 0.25 % IJ SOLN
INTRAMUSCULAR | Status: AC
  Filled 2014-07-25: qty 30

## 2014-07-25 MED ORDER — LACTATED RINGERS IV SOLN
INTRAVENOUS | Status: DC
  Administered 2014-07-25 (×2): via INTRAVENOUS

## 2014-07-25 MED ORDER — OXYCODONE HCL 5 MG PO TABS
5.0000 mg | ORAL_TABLET | ORAL | Status: DC | PRN
  Administered 2014-07-25: 5 mg via ORAL
  Administered 2014-07-26: 10 mg via ORAL
  Filled 2014-07-25: qty 1
  Filled 2014-07-25: qty 2

## 2014-07-25 MED ORDER — LIDOCAINE HCL (CARDIAC) 20 MG/ML IV SOLN
INTRAVENOUS | Status: DC | PRN
  Administered 2014-07-25: 50 mg via INTRAVENOUS

## 2014-07-25 MED ORDER — ENOXAPARIN SODIUM 40 MG/0.4ML ~~LOC~~ SOLN
40.0000 mg | SUBCUTANEOUS | Status: DC
  Administered 2014-07-26: 40 mg via SUBCUTANEOUS

## 2014-07-25 MED ORDER — ROPIVACAINE HCL 5 MG/ML IJ SOLN
INTRAMUSCULAR | Status: DC | PRN
  Administered 2014-07-25: 30 mL

## 2014-07-25 MED ORDER — CHLORHEXIDINE GLUCONATE 4 % EX LIQD: 1.0000 "application " | Freq: Once | CUTANEOUS | Status: DC

## 2014-07-25 MED ORDER — FENTANYL CITRATE 0.05 MG/ML IJ SOLN
INTRAMUSCULAR | Status: AC
  Filled 2014-07-25: qty 2

## 2014-07-25 MED ORDER — CEFAZOLIN SODIUM-DEXTROSE 2-3 GM-% IV SOLR
INTRAVENOUS | Status: AC
  Filled 2014-07-25: qty 50

## 2014-07-25 MED ORDER — ACETAMINOPHEN 650 MG RE SUPP: 650.0000 mg | RECTAL | Status: DC | PRN

## 2014-07-25 MED ORDER — FENTANYL CITRATE 0.05 MG/ML IJ SOLN
50.0000 ug | INTRAMUSCULAR | Status: DC | PRN
  Administered 2014-07-25: 100 ug via INTRAVENOUS

## 2014-07-25 MED ORDER — ACETAMINOPHEN 325 MG PO TABS: 650.0000 mg | ORAL_TABLET | ORAL | Status: DC | PRN

## 2014-07-25 MED ORDER — FENTANYL CITRATE 0.05 MG/ML IJ SOLN: 50.0000 ug | INTRAMUSCULAR | Status: DC | PRN

## 2014-07-25 MED ORDER — MIDAZOLAM HCL 2 MG/2ML IJ SOLN
INTRAMUSCULAR | Status: AC
  Filled 2014-07-25: qty 2

## 2014-07-25 MED ORDER — PROPOFOL 10 MG/ML IV BOLUS
INTRAVENOUS | Status: DC | PRN
  Administered 2014-07-25: 200 mg via INTRAVENOUS

## 2014-07-25 SURGICAL SUPPLY — 77 items
ADH SKN CLS APL DERMABOND .7 (GAUZE/BANDAGES/DRESSINGS) ×1
APL SKNCLS STERI-STRIP NONHPOA (GAUZE/BANDAGES/DRESSINGS)
APPLIER CLIP 11 MED OPEN (CLIP)
APPLIER CLIP 9.375 MED OPEN (MISCELLANEOUS)
APR CLP MED 11 20 MLT OPN (CLIP)
APR CLP MED 9.3 20 MLT OPN (MISCELLANEOUS)
BANDAGE ELASTIC 6 VELCRO ST LF (GAUZE/BANDAGES/DRESSINGS) ×1 IMPLANT
BENZOIN TINCTURE PRP APPL 2/3 (GAUZE/BANDAGES/DRESSINGS) IMPLANT
BINDER BREAST LRG (GAUZE/BANDAGES/DRESSINGS) IMPLANT
BINDER BREAST MEDIUM (GAUZE/BANDAGES/DRESSINGS) ×2 IMPLANT
BINDER BREAST XLRG (GAUZE/BANDAGES/DRESSINGS) IMPLANT
BINDER BREAST XXLRG (GAUZE/BANDAGES/DRESSINGS) IMPLANT
BLADE HEX COATED 2.75 (ELECTRODE) ×1 IMPLANT
BLADE SURG 10 STRL SS (BLADE) ×3 IMPLANT
BLADE SURG 15 STRL LF DISP TIS (BLADE) ×1 IMPLANT
BLADE SURG 15 STRL SS (BLADE) ×3
CANISTER SUCT 1200ML W/VALVE (MISCELLANEOUS) ×3 IMPLANT
CHLORAPREP W/TINT 26ML (MISCELLANEOUS) ×3 IMPLANT
CLIP APPLIE 11 MED OPEN (CLIP) IMPLANT
CLIP APPLIE 9.375 MED OPEN (MISCELLANEOUS) IMPLANT
CLOSURE WOUND 1/2 X4 (GAUZE/BANDAGES/DRESSINGS)
COVER BACK TABLE 60X90IN (DRAPES) ×3 IMPLANT
COVER MAYO STAND STRL (DRAPES) ×3 IMPLANT
COVER PROBE W GEL 5X96 (DRAPES) ×1 IMPLANT
DECANTER SPIKE VIAL GLASS SM (MISCELLANEOUS) IMPLANT
DERMABOND ADVANCED (GAUZE/BANDAGES/DRESSINGS) ×2
DERMABOND ADVANCED .7 DNX12 (GAUZE/BANDAGES/DRESSINGS) ×2 IMPLANT
DEVICE DISSECT PLASMABLAD 3.0S (MISCELLANEOUS) IMPLANT
DEVICE DUBIN W/COMP PLATE 8390 (MISCELLANEOUS) ×1 IMPLANT
DRAIN CHANNEL 19F RND (DRAIN) ×3 IMPLANT
DRAPE LAPAROSCOPIC ABDOMINAL (DRAPES) ×3 IMPLANT
DRAPE UTILITY XL STRL (DRAPES) ×3 IMPLANT
DRSG PAD ABDOMINAL 8X10 ST (GAUZE/BANDAGES/DRESSINGS) IMPLANT
DRSG TEGADERM 4X10 (GAUZE/BANDAGES/DRESSINGS) ×1 IMPLANT
ELECT REM PT RETURN 9FT ADLT (ELECTROSURGICAL) ×3
ELECTRODE REM PT RTRN 9FT ADLT (ELECTROSURGICAL) ×1 IMPLANT
EVACUATOR SILICONE 100CC (DRAIN) ×3 IMPLANT
GLOVE BIOGEL PI IND STRL 7.5 (GLOVE) IMPLANT
GLOVE BIOGEL PI IND STRL 8 (GLOVE) ×1 IMPLANT
GLOVE BIOGEL PI INDICATOR 7.5 (GLOVE) ×2
GLOVE BIOGEL PI INDICATOR 8 (GLOVE) ×2
GLOVE ECLIPSE 7.5 STRL STRAW (GLOVE) ×2 IMPLANT
GLOVE ECLIPSE 8.0 STRL XLNG CF (GLOVE) ×3 IMPLANT
GOWN STRL REUS W/ TWL LRG LVL3 (GOWN DISPOSABLE) ×2 IMPLANT
GOWN STRL REUS W/TWL LRG LVL3 (GOWN DISPOSABLE) ×6
NDL HYPO 25X1 1.5 SAFETY (NEEDLE) ×2 IMPLANT
NDL SAFETY ECLIPSE 18X1.5 (NEEDLE) ×1 IMPLANT
NEEDLE HYPO 18GX1.5 SHARP (NEEDLE)
NEEDLE HYPO 25X1 1.5 SAFETY (NEEDLE) IMPLANT
NS IRRIG 1000ML POUR BTL (IV SOLUTION) ×2 IMPLANT
PACK BASIN DAY SURGERY FS (CUSTOM PROCEDURE TRAY) ×3 IMPLANT
PENCIL BUTTON HOLSTER BLD 10FT (ELECTRODE) ×1 IMPLANT
PIN SAFETY STERILE (MISCELLANEOUS) ×1 IMPLANT
PLASMABLADE 3.0S (MISCELLANEOUS) ×3
SLEEVE SCD COMPRESS KNEE MED (MISCELLANEOUS) ×3 IMPLANT
SPONGE GAUZE 4X4 12PLY STER LF (GAUZE/BANDAGES/DRESSINGS) ×2 IMPLANT
SPONGE LAP 18X18 X RAY DECT (DISPOSABLE) ×3 IMPLANT
SPONGE LAP 4X18 X RAY DECT (DISPOSABLE) ×2 IMPLANT
STAPLER VISISTAT 35W (STAPLE) ×1 IMPLANT
STRIP CLOSURE SKIN 1/2X4 (GAUZE/BANDAGES/DRESSINGS) IMPLANT
SUT CHROMIC 3 0 SH 27 (SUTURE) IMPLANT
SUT ETHILON 2 0 FS 18 (SUTURE) ×3 IMPLANT
SUT MNCRL AB 3-0 PS2 18 (SUTURE) ×3 IMPLANT
SUT MON AB 4-0 PC3 18 (SUTURE) IMPLANT
SUT SILK 2 0 FS (SUTURE) ×3 IMPLANT
SUT SILK 2 0 SH (SUTURE) IMPLANT
SUT VIC AB 3-0 54X BRD REEL (SUTURE) ×1 IMPLANT
SUT VIC AB 3-0 BRD 54 (SUTURE) ×3
SUT VIC AB 3-0 SH 27 (SUTURE)
SUT VIC AB 3-0 SH 27X BRD (SUTURE) IMPLANT
SUT VICRYL 3-0 CR8 SH (SUTURE) ×6 IMPLANT
SYR CONTROL 10ML LL (SYRINGE) ×2 IMPLANT
TOWEL OR 17X24 6PK STRL BLUE (TOWEL DISPOSABLE) ×4 IMPLANT
TOWEL OR NON WOVEN STRL DISP B (DISPOSABLE) ×3 IMPLANT
TUBE CONNECTING 20'X1/4 (TUBING) ×1
TUBE CONNECTING 20X1/4 (TUBING) ×2 IMPLANT
YANKAUER SUCT BULB TIP NO VENT (SUCTIONS) ×3 IMPLANT

## 2014-07-25 NOTE — Interval H&P Note (Signed)
History and Physical Interval Note:  07/25/2014 1:18 PM  Lydia Roberts  has presented today for surgery, with the diagnosis of Right Breast Cancer  The various methods of treatment have been discussed with the patient and family. After consideration of risks, benefits and other options for treatment, the patient has consented to  Procedure(s): RIGHT SIMPLE MASTECTOMY (Right) as a surgical intervention .  The patient's history has been reviewed, patient examined, no change in status, stable for surgery.  I have reviewed the patient's chart and labs.  Questions were answered to the patient's satisfaction.     Cleda Imel A.

## 2014-07-25 NOTE — Progress Notes (Signed)
Assisted Dr. Edmond Fitzgerald with right, ultrasound guided, pectoralis block. Side rails up, monitors on throughout procedure. See vital signs in flow sheet. Tolerated Procedure well. 

## 2014-07-25 NOTE — Transfer of Care (Signed)
Immediate Anesthesia Transfer of Care Note  Patient: Lydia Roberts  Procedure(s) Performed: Procedure(s): RIGHT SIMPLE MASTECTOMY (Right)  Patient Location: PACU  Anesthesia Type:General and Regional  Level of Consciousness: awake and alert   Airway & Oxygen Therapy: Patient Spontanous Breathing and Patient connected to face mask oxygen  Post-op Assessment: Report given to PACU RN and Post -op Vital signs reviewed and stable  Post vital signs: Reviewed and stable  Complications: No apparent anesthesia complications

## 2014-07-25 NOTE — H&P (Signed)
Lydia Roberts  06/19/2014 2:29 PM  Location: [facility]  Patient #: [id number]  DOB: January 05, 1946  Undefined / Language: Lydia Roberts / Race: Undefined  Female  History of Present Illness Lydia Roberts A. [name] MD; 06/19/2014 2:34 PM)  Patient words: pt presents at tthe request of Dr [name] of [facility] for roght breast cancer. Pt has history of right breast lumpectomy and ALND in 1993 for right breast cancer.  The patient is a 68 year old female who presents with breast cancer. 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 Tumor markers include estrogen receptor positive and progesterone receptor positive. No associated conditions are noted. The patient was referred by a specialty consultant. Initial presentation was 3 week(s) ago for breast mass on self-examination. Current diagnosis was determined by mammography, breast ultrasound and core needle biopsy. Treatment has included lumpectomy, axillary dissection and radiation therapy. Symptoms do not include breast pain. Patient describes symptoms as mild.  Problem List/Past Medical Lydia Daniels, MD; 06/19/2014 2:37 PM)  HYPOTHYROIDISM (244.9  E03.9)  HIGH CHOLESTEROL (272.0  E78.0)  HISTORY OF CANCER OF RIGHT BREAST (V10.3  Z85.3) lumpectomy/ALND/Radiation 1993  Allergies Lydia Daniels, MD; 06/19/2014 2:38 PM)  SULFUR  CODEINE  Medication History Lydia Daniels, MD; 06/19/2014 2:39 PM)  levothyroxine Active.  zetia Active.  alendronate Active.  Social History Lydia Daniels, MD; 06/19/2014 2:40 PM)  Tobacco use  Alcohol use  Alcohol Assessment none  Family History Lydia Daniels, MD; 06/19/2014 2:42 PM)  Breast cancer First Degree Relatives.  Cancer  Review of Systems (Mckenize Roberts A. Lydia Justus MD; 06/19/2014 2:34 PM)  General Not Present- Appetite Loss, Chills, Fatigue, Fever, Night Sweats, Weight Gain and Weight Loss.  Skin Not Present- Change in Wart/Mole, Dryness, Hives,  Jaundice, New Lesions, Non-Healing Wounds, Rash and Ulcer.  HEENT Not Present- Earache, Hearing Loss, Hoarseness, Nose Bleed, Oral Ulcers, Ringing in the Ears, Seasonal Allergies, Sinus Pain, Sore Throat, Visual Disturbances, Wears glasses/contact lenses and Yellow Eyes.  Respiratory Not Present- Bloody sputum, Chronic Cough, Difficulty Breathing, Snoring and Wheezing.  Breast Not Present- Breast Mass, Breast Pain, Nipple Discharge and Skin Changes.  Cardiovascular Not Present- Chest Pain, Difficulty Breathing Lying Down, Leg Cramps, Palpitations, Rapid Heart Rate, Shortness of Breath and Swelling of Extremities.  Gastrointestinal Not Present- Abdominal Pain, Bloating, Bloody Stool, Change in Bowel Habits, Chronic diarrhea, Constipation, Difficulty Swallowing, Excessive gas, Gets full quickly at meals, Hemorrhoids, Indigestion, Nausea, Rectal Pain and Vomiting.  Female Genitourinary Not Present- Frequency, Nocturia, Painful Urination, Pelvic Pain and Urgency.  Musculoskeletal Not Present- Back Pain, Joint Pain, Joint Stiffness, Muscle Pain, Muscle Weakness and Swelling of Extremities.  Neurological Not Present- Decreased Memory, Fainting, Headaches, Numbness, Seizures, Tingling, Tremor, Trouble walking and Weakness.  Psychiatric Not Present- Anxiety, Bipolar, Change in Sleep Pattern, Depression, Fearful and Frequent crying.  Endocrine Not Present- Cold Intolerance, Excessive Hunger, Hair Changes, Heat Intolerance, Hot flashes and New Diabetes.  Hematology Not Present- Easy Bruising, Excessive bleeding, Gland problems, HIV and Persistent Infections.  Physical Exam ([name] [name] MD; 06/19/2014 2:44 PM)  General  Mental Status - Alert.  General Appearance - Consistent with stated age.  Hydration - Well hydrated.  Voice - Normal.  Head and Neck  Head - normocephalic, atraumatic with no lesions or palpable masses.  Trachea - midline.  Thyroid  Gland Characteristics - normal size and consistency.   Eye  Eyeball - Bilateral - Extraocular movements intact.  Sclera/Conjunctiva -  Bilateral - No scleral icterus.  Chest and Lung Exam  Chest and lung exam reveals - quiet, even and easy respiratory effort with no use of accessory muscles, normal resonance, no flatness or dullness, non-tender and normal tactile fremitus and on auscultation, normal breath sounds, no adventitious sounds and normal vocal resonance.  Inspection  Chest Wall - Normal. Back - normal.  Breast  Breast - Right - Normal.  Breast Lump  Right Lower Outer - Consistency - Firm. Mobility - Mobile. Shape - Round. Margin - Ill Defined. Overlying Skin - Normal.  Cardiovascular  Cardiovascular examination reveals - on palpation PMI is normal in location and amplitude, no palpable S3 or S4. Normal cardiac borders., normal heart sounds, regular rate and rhythm with no murmurs, carotid auscultation reveals no bruits and normal pedal pulses bilaterally.  Abdomen  Inspection  Inspection of the abdomen reveals - No Hernias. Skin - Scar - no surgical scars.  Palpation/Percussion  Palpation and Percussion of the abdomen reveal - Soft, Non Tender, No Rebound tenderness, No Rigidity (guarding) and No hepatosplenomegaly.  Auscultation  Auscultation of the abdomen reveals - Bowel sounds normal.  Peripheral Vascular  Upper Extremity  Inspection - Bilateral - Normal - No Clubbing, No Cyanosis, No Edema, Pulses Intact. Palpation - Pulses bilaterally normal.  Lower Extremity  Palpation - Pulses bilaterally normal.  Neurologic  Neurologic evaluation reveals - alert and oriented x 3 with no impairment of recent or remote memory.  Mental Status - Normal.  Lymphatic  Head & Neck  General Head & Neck Lymphatics: Bilateral - Description - Normal.  Axillary  General Axillary Region: Bilateral - Description - Normal. Tenderness - Non Tender.  Femoral & Inguinal  Generalized Femoral & Inguinal Lymphatics: Bilateral - Description - Normal.  Tenderness - Non Tender.  Assessment & Plan ([name] A. Krystie Leiter MD; 06/19/2014 2:51 PM)  BREAST CANCER, RIGHT (174.9  C50.911)  Impression: Discussed treatment options for breast cancer to include breast conservation vs mastectomy with reconstruction. Pt has decided on mastectomy. Risk include bleeding, infection, flap necrosis, pain, numbness, recurrence, hematoma, other surgery needs. Pt understands and agrees to proceed with right simple mastectomy.Has ALND in the past.  Current Plans  Schedule for Surgery  MASTECTOMY, SIMPLE, UNILATERAL (97026) (spent 1 hour with patient and reviewing data)

## 2014-07-25 NOTE — Anesthesia Preprocedure Evaluation (Addendum)
Anesthesia Evaluation  Patient identified by MRN, date of birth, ID band Patient awake    Reviewed: Allergy & Precautions, H&P , NPO status , Patient's Chart, lab work & pertinent test results  Airway Mallampati: II TM Distance: >3 FB Neck ROM: Full    Dental no notable dental hx. (+) Teeth Intact, Dental Advisory Given   Pulmonary neg pulmonary ROS,  breath sounds clear to auscultation  Pulmonary exam normal       Cardiovascular negative cardio ROS  Rhythm:Regular Rate:Normal     Neuro/Psych Anxiety negative neurological ROS     GI/Hepatic negative GI ROS, Neg liver ROS,   Endo/Other  Hypothyroidism   Renal/GU negative Renal ROS  negative genitourinary   Musculoskeletal  (+) Arthritis -, Osteoarthritis,    Abdominal   Peds  Hematology negative hematology ROS (+)   Anesthesia Other Findings   Reproductive/Obstetrics negative OB ROS                          Anesthesia Physical Anesthesia Plan  ASA: II  Anesthesia Plan: General and Regional   Post-op Pain Management:    Induction: Intravenous  Airway Management Planned: LMA  Additional Equipment:   Intra-op Plan:   Post-operative Plan:   Informed Consent: I have reviewed the patients History and Physical, chart, labs and discussed the procedure including the risks, benefits and alternatives for the proposed anesthesia with the patient or authorized representative who has indicated his/her understanding and acceptance.   Dental advisory given  Plan Discussed with: CRNA and Surgeon  Anesthesia Plan Comments:        Anesthesia Quick Evaluation

## 2014-07-25 NOTE — Anesthesia Postprocedure Evaluation (Signed)
Anesthesia Post Note  Patient: Lydia Roberts  Procedure(s) Performed: Procedure(s) (LRB): RIGHT SIMPLE MASTECTOMY (Right)  Anesthesia type: general  Patient location: PACU  Post pain: Pain level controlled  Post assessment: Patient's Cardiovascular Status Stable  Last Vitals:  Filed Vitals:   07/25/14 1545  BP: 126/51  Pulse: 78  Temp:   Resp: 20    Post vital signs: Reviewed and stable  Level of consciousness: sedated  Complications: No apparent anesthesia complications

## 2014-07-25 NOTE — Op Note (Signed)
Lydia Roberts [facility] Mar 14, 1946 [id number] 07/25/2014   Preoperative diagnosis: right breast cancer stage 1 with hx of right breast cancer 1993   Postoperative diagnosis: same  Procedure: right simple mastectomy  Surgeon: Erroll Luna A.  Pensions consultant: none  Anesthesia:GA combined with regional for post-op pain  Clinical History and Indications:The patient is seen in the holding area and we reviewed the plans for the procedure as noted above. We reviewed the risks and complications a final time. She had no further questions. I marked theright side as the operative side.The surgical and non surgical options have been discussed with the patient.  Risks of surgery include bleeding,  Infection,  Flap necrosis,  Tissue loss,  Chronic pain, death, Numbness,  And the need for additional procedures.  Reconstruction options also have been discussed with the patient as well.  The patient agrees to proceed.  Description of Procedure: The patient was taken to the operating room. After satisfactory general anesthesia was obtained the timeout was done.  . A full prep and drape was then done.  I outlined an elliptical incision and marked the inframammary fold and midline. The incision was made. The usual skin flaps were raised. I went to the clavicle superiorly and sternum medially and towards the latissimus laterally.      Plasma knife used to elevate the skin flaps in the usual manner.  The breast was excised of the pectoral fascia in a medial to lateral  Direction until the axilla was encountered. She had a previous ALND.    The inferior flap was then made going to the inframammary fold and out to the latissimus. The breast was then removed from the pectoralis starting medially and working laterally. When I got to the lateral aspect of the pectoralis major muscle I opened the clavipectoral fascia. I finished removing the breast from the serratus muscles.     The specimen was marked to orient it  for the pathologist and passed off the table.  I spent several minutes irrigating and making sure everything was dry. I then placed a 10 [place] Blake drain through a small incision in the inferior flap and laid along the pectoralis muscle. It was secured with a 2-0 nylon suture.  Another irrigation and check for hemostasis was made. Everything appeared to be dry. Incision was then closed with 3 0 VICRYL AND 4 0 MONOCRYL. Dermabond applied.   The patient tolerated the procedure well. There were no operative complications. All counts were correct. Estimated blood loss was 50 cc. Sterile dressings were applied and the patient taken to the PACU in satisfactory condition.  Erroll Luna , MD, FACS 07/25/2014 2:49 PM

## 2014-07-25 NOTE — Anesthesia Procedure Notes (Addendum)
Anesthesia Regional Block:  Pectoralis block  Pre-Anesthetic Checklist: ,, timeout performed, Correct Patient, Correct Site, Correct Laterality, Correct Procedure, Correct Position, site marked, Risks and benefits discussed, pre-op evaluation, post-op pain management  Laterality: Right  Prep: Maximum Sterile Barrier Precautions used and chloraprep       Needles:  Injection technique: Single-shot  Needle Type: Echogenic Stimulator Needle     Needle Length: 9cm 9 cm Needle Gauge: 21 and 21 G    Additional Needles:  Procedures: ultrasound guided (picture in chart) Pectoralis block Narrative:  Start time: 07/25/2014 1:05 PM End time: 07/25/2014 1:14 PM Injection made incrementally with aspirations every 5 mL. Anesthesiologist: Darrow Barreiro,MD  Additional Notes: 2% Lidocaine skin wheel.

## 2014-07-26 ENCOUNTER — Encounter (HOSPITAL_BASED_OUTPATIENT_CLINIC_OR_DEPARTMENT_OTHER): Payer: Self-pay | Admitting: Surgery

## 2014-07-26 DIAGNOSIS — C50911 Malignant neoplasm of unspecified site of right female breast: Secondary | ICD-10-CM | POA: Diagnosis not present

## 2014-07-26 NOTE — Discharge Instructions (Signed)
[facility] °[phone] ° °MASTECTOMY: POST OP INSTRUCTIONS ° °Always review your discharge instruction sheet given to you by the facility where your surgery was performed. °IF YOU HAVE DISABILITY OR FAMILY LEAVE FORMS, YOU MUST BRING THEM TO THE OFFICE FOR PROCESSING.   °DO NOT GIVE THEM TO YOUR DOCTOR. °A prescription for pain medication may be given to you upon discharge.  Take your pain medication as prescribed, if needed.  If narcotic pain medicine is not needed, then you may take acetaminophen (Tylenol) or ibuprofen (Advil) as needed. °1. Take your usually prescribed medications unless otherwise directed. °2. If you need a refill on your pain medication, please contact your pharmacy.  They will contact our office to request authorization.  Prescriptions will not be filled after 5pm or on week-ends. °3. You should follow a light diet the first few days after arrival home, such as soup and crackers, etc.  Resume your normal diet the day after surgery. °4. Most patients will experience some swelling and bruising on the chest and underarm.  Ice packs will help.  Swelling and bruising can take several days to resolve.  °5. It is common to experience some constipation if taking pain medication after surgery.  Increasing fluid intake and taking a stool softener (such as Colace) will usually help or prevent this problem from occurring.  A mild laxative (Milk of Magnesia or Miralax) should be taken according to package instructions if there are no bowel movements after 48 hours. °6. Unless discharge instructions indicate otherwise, leave your bandage dry and in place until your next appointment in 3-5 days.  You may take a limited sponge bath.  No tube baths or showers until the drains are removed.  You may have steri-strips (small skin tapes) in place directly over the incision.  These strips should be left on the skin for 7-10 days.  If your surgeon used skin glue on the incision, you may  shower in 24 hours.  The glue will flake off over the next 2-3 weeks.  Any sutures or staples will be removed at the office during your follow-up visit. °7. DRAINS:  If you have drains in place, it is important to keep a list of the amount of drainage produced each day in your drains.  Before leaving the hospital, you should be instructed on drain care.  Call our office if you have any questions about your drains. °8. ACTIVITIES:  You may resume regular (light) daily activities beginning the next day--such as daily self-care, walking, climbing stairs--gradually increasing activities as tolerated.  You may have sexual intercourse when it is comfortable.  Refrain from any heavy lifting or straining until approved by your doctor. °a. You may drive when you are no longer taking prescription pain medication, you can comfortably wear a seatbelt, and you can safely maneuver your car and apply brakes. °b. RETURN TO WORK:  __________________________________________________________ °9. You should see your doctor in the office for a follow-up appointment approximately 3-5 days after your surgery.  Your doctor’s nurse will typically make your follow-up appointment when she calls you with your pathology report.  Expect your pathology report 2-3 business days after your surgery.  You may call to check if you do not hear from us after three days.   °10. OTHER INSTRUCTIONS: ______________________________________________________________________________________________ ____________________________________________________________________________________________ ° °WHEN TO CALL YOUR DOCTOR: °1. Fever over 101.0 °2. Nausea and/or vomiting °3. Extreme swelling or bruising °4. Continued bleeding from incision. °5. Increased pain, redness, or drainage from the   incision. ° °The clinic staff is available to answer your questions during regular business hours.  Please don’t hesitate to call and ask to speak to one of the nurses for clinical  concerns.  If you have a medical emergency, go to the nearest emergency room or call 911.  A surgeon from [facility] is always on call at the hospital. °[address], [address], [place], [place]  [address] ? [address] [address], , [place]   [address] °[phone] ? [phone] ? FAX [phone] °Web site: [facility] ° °About my Jackson-Pratt Bulb Drain ° °What is a Jackson-Pratt bulb? °A Jackson-Pratt is a soft, round device used to collect drainage. It is connected to a long, thin drainage catheter, which is held in place by one or two small stiches near your surgical incision site. When the bulb is squeezed, it forms a vacuum, forcing the drainage to empty into the bulb. ° °Emptying the Jackson-Pratt bulb- °To empty the bulb: °1. Release the plug on the top of the bulb. °2. Pour the bulb's contents into a measuring container which your nurse will provide. °3. Record the time emptied and amount of drainage. Empty the drain(s) as often as your     doctor or nurse recommends. ° °Date                  Time                    Amount (Drain 1)                 Amount (Drain 2) ° °_____________________________________________________________________ ° °_____________________________________________________________________ ° °_____________________________________________________________________ ° °_____________________________________________________________________ ° °_____________________________________________________________________ ° °_____________________________________________________________________ ° °_____________________________________________________________________ ° °_____________________________________________________________________ ° °Squeezing the Jackson-Pratt Bulb- °To squeeze the bulb: °1. Make sure the plug at the top of the bulb is open. °2. Squeeze the bulb tightly in your fist. You will hear air squeezing from the bulb. °3. Replace the plug while the bulb is squeezed. °4.  Use a safety pin to attach the bulb to your clothing. This will keep the catheter from     pulling at the bulb insertion site. ° °When to call your doctor- °Call your doctor if: °· Drain site becomes red, swollen or hot. °· You have a fever greater than 101 degrees F. °· There is oozing at the drain site. °· Drain falls out (apply a guaze bandage over the drain hole and secure it with tape). °· Drainage increases daily not related to activity patterns. (You will usually have more drainage when you are active than when you are resting.) °· Drainage has a bad odor. ° °

## 2014-08-01 ENCOUNTER — Encounter: Payer: Self-pay | Admitting: *Deleted

## 2014-08-01 NOTE — Progress Notes (Signed)
Ordered oncotype per Dr. Lindi Adie.  Faxed requisition to pathology and confirmed with [facility].  Faxed PAC to [place].

## 2014-08-05 ENCOUNTER — Encounter (HOSPITAL_BASED_OUTPATIENT_CLINIC_OR_DEPARTMENT_OTHER): Payer: Self-pay | Admitting: Surgery

## 2014-08-12 ENCOUNTER — Encounter (HOSPITAL_COMMUNITY): Payer: Self-pay

## 2014-08-12 ENCOUNTER — Encounter: Payer: Self-pay | Admitting: *Deleted

## 2014-08-12 NOTE — Progress Notes (Signed)
Received Oncotype Dx results of 4.  Gave copy to Dr. Lindi Adie and took a copy to HIM to scan.

## 2014-08-13 ENCOUNTER — Telehealth: Payer: Self-pay | Admitting: Hematology and Oncology

## 2014-08-13 ENCOUNTER — Ambulatory Visit (HOSPITAL_BASED_OUTPATIENT_CLINIC_OR_DEPARTMENT_OTHER): Payer: Medicare Other | Admitting: Hematology and Oncology

## 2014-08-13 VITALS — BP 114/62 | HR 52 | Temp 98.1°F | Resp 18 | Ht 65.0 in | Wt 136.0 lb

## 2014-08-13 DIAGNOSIS — C50211 Malignant neoplasm of upper-inner quadrant of right female breast: Secondary | ICD-10-CM

## 2014-08-13 DIAGNOSIS — Z170 Estrogen receptor positive status [ER+]: Secondary | ICD-10-CM

## 2014-08-13 DIAGNOSIS — M810 Age-related osteoporosis without current pathological fracture: Secondary | ICD-10-CM

## 2014-08-13 MED ORDER — TAMOXIFEN CITRATE 20 MG PO TABS: 20.0000 mg | ORAL_TABLET | Freq: Every day | ORAL | Status: DC

## 2014-08-13 NOTE — Progress Notes (Signed)
Patient Care Team: Thressa Sheller, MD as PCP - General (Internal Medicine) Terrance Mass, MD as Referring Physician (Gynecology) Erroll Luna, MD as Consulting Physician (General Surgery) Rulon Eisenmenger, MD as Consulting Physician (Hematology and Oncology) Marye Round, MD as Consulting Physician (Radiation Oncology)  DIAGNOSIS: Breast cancer of upper-inner quadrant of right female breast   Staging form: Breast, AJCC 7th Edition     Clinical: Stage IA (T1c, N0, cM0) - Unsigned       Staging comments: Staged at breast conference 06/19/14.      Pathologic: No stage assigned - Unsigned   SUMMARY OF ONCOLOGIC HISTORY:   Breast cancer of upper-inner quadrant of right female breast   06/12/2014 Mammogram Right breast architectural distortion (dense breasts); Ultrasound 1.3 cm mass at 1:00 position additional masses behind this mass measuring 1.3 cm and 0.7 cm. These lumps were palpable   06/13/2014 Initial Diagnosis Right breast: 1.3cm mass: Invasive mammary cancer probably ductal with mammary ca in situ ER 100% PR 80% gases on 30% HER-2 negative; posterior 1.3 cm mass: IDC ER 100% PR 90% gets some 20% HER-2 negative and 0.7 cm mass: DCIS   07/25/2014 Surgery Right simple mastectomy: Invasive ductal carcinoma No LVI, Pos for PNI, 3.1 cm grade 3 with DCIS grade 3, ER 100%, PR 100%, HER-2/neu negative Ki-67 20% T2, N0, M0 stage II A., Oncotype DX recurrence over 4, ROR 4%    CHIEF COMPLIANT: Followup after surgery  INTERVAL HISTORY: Lydia Roberts is a 68 year old Caucasian lady with above-mentioned history of right-sided breast cancer she underwent mastectomy and is here today to discuss the results. She also had Oncotype DX testing. She is recovering very well from the surgery. She feels occasional tingling in the chest.  REVIEW OF SYSTEMS:   Constitutional: Denies fevers, chills or abnormal weight loss Eyes: Denies blurriness of vision Ears, nose, mouth, throat, and face: Denies  mucositis or sore throat Respiratory: Denies cough, dyspnea or wheezes Cardiovascular: Denies palpitation, chest discomfort or lower extremity swelling Gastrointestinal:  Denies nausea, heartburn or change in bowel habits Skin: Denies abnormal skin rashes Lymphatics: Denies new lymphadenopathy or easy bruising Neurological:Denies numbness, tingling or new weaknesses Behavioral/Psych: Mood is stable, no new changes  Breast: right mastectomy All other systems were reviewed with the patient and are negative.  I have reviewed the past medical history, past surgical history, social history and family history with the patient and they are unchanged from previous note.  ALLERGIES:  is allergic to sulfur and codeine.  MEDICATIONS:  Current Outpatient Prescriptions  Medication Sig Dispense Refill  . alendronate (FOSAMAX) 70 MG tablet Take 70 mg by mouth once a week. Take with a full glass of water on an empty stomach.    . cholecalciferol (VITAMIN D) 1000 UNITS tablet Take 1,000 Units by mouth daily.    Marland Kitchen ezetimibe (ZETIA) 10 MG tablet Take 10 mg by mouth daily.    Marland Kitchen HYDROcodone-acetaminophen (NORCO) 5-325 MG per tablet Take 1-2 tablets by mouth every 4 (four) hours as needed for moderate pain. 30 tablet 0  . levothyroxine (SYNTHROID, LEVOTHROID) 125 MCG tablet Take 125 mcg by mouth daily.    Marland Kitchen loratadine (CLARITIN) 10 MG tablet Take 10 mg by mouth daily.    . tamoxifen (NOLVADEX) 20 MG tablet Take 1 tablet (20 mg total) by mouth daily. 90 tablet 3   No current facility-administered medications for this visit.    PHYSICAL EXAMINATION: ECOG PERFORMANCE STATUS: 0 - Asymptomatic  Filed Vitals:  08/13/14 0824  BP: 114/62  Pulse: 52  Temp: 98.1 F (36.7 C)  Resp: 18   Filed Weights   08/13/14 0824  Weight: 136 lb (61.689 kg)    GENERAL:alert, no distress and comfortable SKIN: skin color, texture, turgor are normal, no rashes or significant lesions EYES: normal, Conjunctiva are pink  and non-injected, sclera clear OROPHARYNX:no exudate, no erythema and lips, buccal mucosa, and tongue normal  NECK: supple, thyroid normal size, non-tender, without nodularity LYMPH:  no palpable lymphadenopathy in the cervical, axillary or inguinal LUNGS: clear to auscultation and percussion with normal breathing effort HEART: regular rate & rhythm and no murmurs and no lower extremity edema ABDOMEN:abdomen soft, non-tender and normal bowel sounds Musculoskeletal:no cyanosis of digits and no clubbing  NEURO: alert & oriented x 3 with fluent speech, no focal motor/sensory deficits  LABORATORY DATA:  I have reviewed the data as listed   Chemistry      Component Value Date/Time   NA 142 07/19/2014 1130   NA 139 06/19/2014 1233   K 4.5 07/19/2014 1130   K 4.2 06/19/2014 1233   CL 103 07/19/2014 1130   CO2 28 07/19/2014 1130   CO2 24 06/19/2014 1233   BUN 13 07/19/2014 1130   BUN 12.4 06/19/2014 1233   CREATININE 0.96 07/19/2014 1130   CREATININE 1.1 06/19/2014 1233      Component Value Date/Time   CALCIUM 9.8 07/19/2014 1130   CALCIUM 9.7 06/19/2014 1233   ALKPHOS 58 07/19/2014 1130   ALKPHOS 69 06/19/2014 1233   AST 17 07/19/2014 1130   AST 17 06/19/2014 1233   ALT 12 07/19/2014 1130   ALT 8 06/19/2014 1233   BILITOT 1.4* 07/19/2014 1130   BILITOT 1.46* 06/19/2014 1233       Lab Results  Component Value Date   WBC 3.8* 07/19/2014   HGB 12.8 07/19/2014   HCT 39.0 07/19/2014   MCV 89.9 07/19/2014   PLT 188 07/19/2014   NEUTROABS 1.9 07/19/2014    ASSESSMENT & PLAN:  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 N0 M0 stage IA: Pathologic stage TII, N0, M0 stage II A., 3.1 cm tumor ER/PR positive HER-2 negative, grade 3, perineural invasion present but no lymphovascular invasion. Oncotype DX recurrence score 4, 4% ROR  Pathology counseling: Discussed the  final pathology report in great detail as well as Oncotype DX result. Surprisingly even though the tumor was 3.1 cm in size and it was grade 3, the recurrence score was low.   Recommendation:I do not recommend systemic chemotherapy and would recommend antiestrogen therapy with Tamoxifen (5-10 years) Tamoxifen chosen due to osteoporosis. Tamoxifen counseling: We discussed the risks and benefits of anti-estrogen therapy with aromatase inhibitors. These include but not limited to insomnia, hot flashes, mood changes, vaginal dryness, bone density loss, and weight gain. Although rare, serious side effects including endometrial cancer, risk of blood clots were also discussed. We strongly believe that the benefits far outweigh the risks. Patient understands these risks and consented to starting treatment. Planned treatment duration is 5 years.  Surveillance: Periodic breast exams and annual mammograms.  return to clinic in 3 months No orders of the defined types were placed in this encounter.   The patient has a good understanding of the overall plan. she agrees with it. She will call with any problems that may develop before her next visit here.  I spent 20 minutes  counseling the patient face to face. The total time spent in the appointment was 25 minutes and more than 50% was on counseling and review of test results    Rulon Eisenmenger, MD 08/13/2014 9:20 AM

## 2014-08-13 NOTE — Telephone Encounter (Signed)
per pof to sch pt appt-gave pt copy of sch °

## 2014-08-13 NOTE — Assessment & Plan Note (Addendum)
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 N0 M0 stage IA: Pathologic stage TII, N0, M0 stage II A., 3.1 cm tumor ER/PR positive HER-2 negative, grade 3, perineural invasion present but no lymphovascular invasion. Oncotype DX recurrence score 4, 4% ROR  Pathology counseling: Discussed the final pathology report in great detail as well as Oncotype DX result. Surprisingly even though the tumor was 3.1 cm in size and it was grade 3, the recurrence score was low.   Recommendation:I do not recommend systemic chemotherapy and would recommend antiestrogen therapy with Tamoxifen (5-10 years) Tamoxifen chosen due to osteoporosis. Tamoxifen counseling: We discussed the risks and benefits of anti-estrogen therapy with aromatase inhibitors. These include but not limited to insomnia, hot flashes, mood changes, vaginal dryness, bone density loss, and weight gain. Although rare, serious side effects including endometrial cancer, risk of blood clots were also discussed. We strongly believe that the benefits far outweigh the risks. Patient understands these risks and consented to starting treatment. Planned treatment duration is 5 years.  Surveillance: Periodic breast exams and annual mammograms.

## 2014-08-19 ENCOUNTER — Telehealth: Payer: Self-pay | Admitting: Hematology and Oncology

## 2014-08-19 NOTE — Telephone Encounter (Signed)
, °

## 2014-11-25 ENCOUNTER — Telehealth: Payer: Self-pay | Admitting: Hematology and Oncology

## 2014-11-25 ENCOUNTER — Ambulatory Visit (HOSPITAL_BASED_OUTPATIENT_CLINIC_OR_DEPARTMENT_OTHER): Payer: Medicare Other | Admitting: Hematology and Oncology

## 2014-11-25 VITALS — BP 148/65 | HR 80 | Temp 98.4°F | Resp 18 | Ht 65.0 in | Wt 142.2 lb

## 2014-11-25 DIAGNOSIS — Z170 Estrogen receptor positive status [ER+]: Secondary | ICD-10-CM

## 2014-11-25 DIAGNOSIS — C50211 Malignant neoplasm of upper-inner quadrant of right female breast: Secondary | ICD-10-CM

## 2014-11-25 DIAGNOSIS — M810 Age-related osteoporosis without current pathological fracture: Secondary | ICD-10-CM

## 2014-11-25 NOTE — Assessment & Plan Note (Signed)
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 N0 M0 stage IA: Pathologic stage TII, N0, M0 stage II A., 3.1 cm tumor ER/PR positive HER-2 negative, grade 3, perineural invasion present but no lymphovascular invasion. Oncotype DX recurrence score 4, 4% ROR  Current Treatment: Adjuvant Tamoxifen X 10 years (Osteoporosis) Tamoxifen toxicities: No major side effects of tamoxifen Breast cancer surveillance: Mammogram to be done September 2016 Osteoporosis: On calcium and vitamin D and bisphosphonates Return to clinic in 6 months for follow-up

## 2014-11-25 NOTE — Progress Notes (Signed)
Patient Care Team: Thressa Sheller, MD as PCP - General (Internal Medicine) Terrance Mass, MD as Referring Physician (Gynecology) Erroll Luna, MD as Consulting Physician (General Surgery) Rulon Eisenmenger, MD as Consulting Physician (Hematology and Oncology) Jodelle Gross, MD as Consulting Physician (Radiation Oncology)  DIAGNOSIS: Breast cancer of upper-inner quadrant of right female breast   Staging form: Breast, AJCC 7th Edition     Clinical: Stage IA (T1c, N0, cM0) - Unsigned       Staging comments: Staged at breast conference 06/19/14.      Pathologic: No stage assigned - Unsigned   SUMMARY OF ONCOLOGIC HISTORY:   Breast cancer of upper-inner quadrant of right female breast   06/12/2014 Mammogram Right breast architectural distortion (dense breasts); Ultrasound 1.3 cm mass at 1:00 position additional masses behind this mass measuring 1.3 cm and 0.7 cm. These lumps were palpable   06/13/2014 Initial Diagnosis Right breast: 1.3cm mass: Invasive mammary cancer probably ductal with mammary ca in situ ER 100% PR 80% gases on 30% HER-2 negative; posterior 1.3 cm mass: IDC ER 100% PR 90% gets some 20% HER-2 negative and 0.7 cm mass: DCIS   07/25/2014 Surgery Right simple mastectomy: Invasive ductal carcinoma No LVI, Pos for PNI, 3.1 cm grade 3 with DCIS grade 3, ER 100%, PR 100%, HER-2/neu negative Ki-67 20% T2, N0, M0 stage II A., Oncotype DX recurrence over 4, ROR 4%    CHIEF COMPLIANT: follow-up on tamoxifen  INTERVAL HISTORY: Lydia Roberts is a 69 year old lady with above-mentioned history of right-sided breast cancer who underwent mastectomy with a low risk Oncotype score, she is currently on tamoxifen therapy. She is tolerating it extremely well without any major problems or concerns. She denies any significant hot flashes. She has osteopenia but cannot take any calcium supplements because  Of intolerance.  REVIEW OF SYSTEMS:   Constitutional: Denies fevers, chills or abnormal weight  loss Eyes: Denies blurriness of vision Ears, nose, mouth, throat, and face: Denies mucositis or sore throat Respiratory: Denies cough, dyspnea or wheezes Cardiovascular: Denies palpitation, chest discomfort or lower extremity swelling Gastrointestinal:  Denies nausea, heartburn or change in bowel habits Skin: Denies abnormal skin rashes Lymphatics: Denies new lymphadenopathy or easy bruising Neurological:Denies numbness, tingling or new weaknesses Behavioral/Psych: Mood is stable, no new changes  Breast:  denies any pain or lumps or nodules in either breasts All other systems were reviewed with the patient and are negative.  I have reviewed the past medical history, past surgical history, social history and family history with the patient and they are unchanged from previous note.  ALLERGIES:  is allergic to sulfur and codeine.  MEDICATIONS:  Current Outpatient Prescriptions  Medication Sig Dispense Refill  . alendronate (FOSAMAX) 70 MG tablet Take 70 mg by mouth once a week. Take with a full glass of water on an empty stomach.    . cholecalciferol (VITAMIN D) 1000 UNITS tablet Take 1,000 Units by mouth daily.    Marland Kitchen ezetimibe (ZETIA) 10 MG tablet Take 10 mg by mouth daily.    Marland Kitchen HYDROcodone-acetaminophen (NORCO) 5-325 MG per tablet Take 1-2 tablets by mouth every 4 (four) hours as needed for moderate pain. 30 tablet 0  . levothyroxine (SYNTHROID, LEVOTHROID) 125 MCG tablet Take 125 mcg by mouth daily.    Marland Kitchen loratadine (CLARITIN) 10 MG tablet Take 10 mg by mouth daily.    . tamoxifen (NOLVADEX) 20 MG tablet Take 1 tablet (20 mg total) by mouth daily. 90 tablet 3  No current facility-administered medications for this visit.    PHYSICAL EXAMINATION: ECOG PERFORMANCE STATUS: 0 - Asymptomatic  Filed Vitals:   11/25/14 0911  BP: 148/65  Pulse: 80  Temp: 98.4 F (36.9 C)  Resp: 18   Filed Weights   11/25/14 0911  Weight: 142 lb 3.2 oz (64.501 kg)    GENERAL:alert, no distress  and comfortable SKIN: skin color, texture, turgor are normal, no rashes or significant lesions EYES: normal, Conjunctiva are pink and non-injected, sclera clear OROPHARYNX:no exudate, no erythema and lips, buccal mucosa, and tongue normal  NECK: supple, thyroid normal size, non-tender, without nodularity LYMPH:  no palpable lymphadenopathy in the cervical, axillary or inguinal LUNGS: clear to auscultation and percussion with normal breathing effort HEART: regular rate & rhythm and no murmurs and no lower extremity edema ABDOMEN:abdomen soft, non-tender and normal bowel sounds Musculoskeletal:no cyanosis of digits and no clubbing  NEURO: alert & oriented x 3 with fluent speech, no focal motor/sensory deficits  LABORATORY DATA:  I have reviewed the data as listed   Chemistry      Component Value Date/Time   NA 142 07/19/2014 1130   NA 139 06/19/2014 1233   K 4.5 07/19/2014 1130   K 4.2 06/19/2014 1233   CL 103 07/19/2014 1130   CO2 28 07/19/2014 1130   CO2 24 06/19/2014 1233   BUN 13 07/19/2014 1130   BUN 12.4 06/19/2014 1233   CREATININE 0.96 07/19/2014 1130   CREATININE 1.1 06/19/2014 1233      Component Value Date/Time   CALCIUM 9.8 07/19/2014 1130   CALCIUM 9.7 06/19/2014 1233   ALKPHOS 58 07/19/2014 1130   ALKPHOS 69 06/19/2014 1233   AST 17 07/19/2014 1130   AST 17 06/19/2014 1233   ALT 12 07/19/2014 1130   ALT 8 06/19/2014 1233   BILITOT 1.4* 07/19/2014 1130   BILITOT 1.46* 06/19/2014 1233       Lab Results  Component Value Date   WBC 3.8* 07/19/2014   HGB 12.8 07/19/2014   HCT 39.0 07/19/2014   MCV 89.9 07/19/2014   PLT 188 07/19/2014   NEUTROABS 1.9 07/19/2014     ASSESSMENT & PLAN:  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 N0 M0 stage IA: Pathologic stage TII, N0, M0 stage II A., 3.1 cm tumor ER/PR positive HER-2 negative, grade 3,  perineural invasion present but no lymphovascular invasion. Oncotype DX recurrence score 4, 4% ROR  Current Treatment: Adjuvant Tamoxifen X 10 years (Osteoporosis) Tamoxifen toxicities: No major side effects of tamoxifen Breast cancer surveillance: Mammogram to be done September 2016 Osteoporosis: patient cannot take calcium or vitamin D or bisphosphonates. I instructed her to do weightbearing exercises and yoga.  Return to clinic in 6 months for follow-up  No orders of the defined types were placed in this encounter.   The patient has a good understanding of the overall plan. she agrees with it. She will call with any problems that may develop before her next visit here.   Rulon Eisenmenger, MD

## 2015-01-23 ENCOUNTER — Ambulatory Visit (INDEPENDENT_AMBULATORY_CARE_PROVIDER_SITE_OTHER): Payer: Medicare Other | Admitting: Gynecology

## 2015-01-23 ENCOUNTER — Encounter: Payer: Self-pay | Admitting: Gynecology

## 2015-01-23 VITALS — BP 124/78 | Ht 65.5 in | Wt 141.0 lb

## 2015-01-23 DIAGNOSIS — R350 Frequency of micturition: Secondary | ICD-10-CM

## 2015-01-23 DIAGNOSIS — Z01419 Encounter for gynecological examination (general) (routine) without abnormal findings: Secondary | ICD-10-CM

## 2015-01-23 DIAGNOSIS — M810 Age-related osteoporosis without current pathological fracture: Secondary | ICD-10-CM

## 2015-01-23 DIAGNOSIS — N942 Vaginismus: Secondary | ICD-10-CM | POA: Diagnosis not present

## 2015-01-23 DIAGNOSIS — Z853 Personal history of malignant neoplasm of breast: Secondary | ICD-10-CM | POA: Diagnosis not present

## 2015-01-23 LAB — URINALYSIS W MICROSCOPIC + REFLEX CULTURE
Bilirubin Urine: NEGATIVE
Casts: NONE SEEN
Crystals: NONE SEEN
GLUCOSE, UA: NEGATIVE mg/dL
Ketones, ur: NEGATIVE mg/dL
LEUKOCYTES UA: NEGATIVE
Nitrite: NEGATIVE
PH: 5.5 (ref 5.0–8.0)
Protein, ur: NEGATIVE mg/dL
UROBILINOGEN UA: 0.2 mg/dL (ref 0.0–1.0)

## 2015-01-23 NOTE — Progress Notes (Addendum)
Lydia Roberts 1946/02/17 [id number]   History:    69 y.o.  for annual gyn exam who was complaining of some frequency in urination but no dysuria. 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 N0 M0 stage IA: Pathologic stage TII, N0, M0 stage II A., 3.1 cm tumor ER/PR positive HER-2 negative, grade 3, perineural invasion present but no lymphovascular invasion. Oncotype DX recurrence score 4, 4% ROR. Patient reports no past history of HRT.  Current Treatment: Adjuvant Tamoxifen X 10 years (Osteoporosis)  Patient's PCP is Dr. Noah Delaine who has been doing her blood work and order her bone density study. She states that he recently put her on Fosamax 70 mg every weekly.  Normal colonoscopy report in 2014. Patient with no past history of any abnormal Pap smear.  Past medical history,surgical history, family history and social history were all reviewed and documented in the EPIC chart.  Gynecologic History No LMP recorded. Patient is postmenopausal. Contraception: post menopausal status Last Pap: 2015. Results were: normal Last mammogram: 2015. Results were: See above  Obstetric History OB History  Gravida Para Term Preterm AB SAB TAB Ectopic Multiple Living  '3 3        3    ' # Outcome Date GA Lbr Len/2nd Weight Sex Delivery Anes PTL Lv  3 Para           2 Para           1 Para                ROS: A ROS was performed and pertinent positives and negatives are included in the history.  GENERAL: No fevers or chills. HEENT: No change in vision, no earache, sore throat or sinus congestion. NECK: No pain or stiffness. CARDIOVASCULAR: No chest pain or pressure. No palpitations. PULMONARY: No shortness of breath, cough or wheeze. GASTROINTESTINAL: No abdominal pain, nausea, vomiting or diarrhea, melena or bright red blood per rectum. GENITOURINARY: No urinary  frequency, urgency, hesitancy or dysuria. MUSCULOSKELETAL: No joint or muscle pain, no back pain, no recent trauma. DERMATOLOGIC: No rash, no itching, no lesions. ENDOCRINE: No polyuria, polydipsia, no heat or cold intolerance. No recent change in weight. HEMATOLOGICAL: No anemia or easy bruising or bleeding. NEUROLOGIC: No headache, seizures, numbness, tingling or weakness. PSYCHIATRIC: No depression, no loss of interest in normal activity or change in sleep pattern.     Exam: chaperone present  BP 124/78 mmHg  Ht 5' 5.5" (1.664 m)  Wt 141 lb (63.957 kg)  BMI 23.10 kg/m2  Body mass index is 23.1 kg/(m^2).  General appearance : Well developed well nourished female. No acute distress HEENT: Eyes: no retinal hemorrhage or exudates,  Neck supple, trachea midline, no carotid bruits, no thyroidmegaly Lungs: Clear to auscultation, no rhonchi or wheezes, or rib retractions  Heart: Regular rate and rhythm, no murmurs or gallops Breast:Examined in sitting and supine position were symmetrical in appearance, no palpable masses or tenderness,  no skin retraction, no nipple inversion, no nipple discharge, no skin discoloration, no axillary or supraclavicular lymphadenopathy Abdomen: no palpable masses or tenderness, no rebound or guarding Extremities: no edema or skin discoloration or tenderness  Pelvic:  Bartholin, Urethra, Skene Glands: Within normal limits             Vagina: No gross lesions or discharge, atrophic changes  Cervix: No gross  lesions or discharge  Uteantevertedrus  anteverted, normal size, shape and consistency, non-tender and mobile  Adnexa  Without masses or tenderness  Anus and perineum  normal   Rectovaginal  normal sphincter tone without palpated masses or tenderness             Hemoccult PCP provides     Assessment/Plan:  69 y.o. female for annual exam with history of breast cancer 2 status post lumpectomy currently on tamoxifen. We'll discuss with oncologist due to the  fact the patient is tumor was estrogen and progesterone receptor positive for possible consideration of prophylactic oophorectomy. I did not see a BRCA one BRCA2 testing will check with oncologist. 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 Patient currently on Fosamax recently started because of her osteoporosis. Patient denies any vaginal bleeding. I have asked her to ask her primary care physician to send [place] a copy of her bone density study. Patient with no past history of abnormal Pap smears. Patient will not need Pap smears anymore according to the new guidelines. We discussed importance of calcium vitamin D and weightbearing exercises. Her urinalysis was essentially negative with the exception of few bacteria culture pending.  Terrance Mass MD, 11:39 AM 01/23/2015

## 2015-01-24 LAB — URINE CULTURE
Colony Count: NO GROWTH
ORGANISM ID, BACTERIA: NO GROWTH

## 2015-02-07 ENCOUNTER — Other Ambulatory Visit: Payer: Medicare Other

## 2015-02-07 ENCOUNTER — Ambulatory Visit: Payer: Medicare Other | Admitting: Gynecology

## 2015-02-17 ENCOUNTER — Ambulatory Visit: Payer: Medicare Other | Admitting: Hematology and Oncology

## 2015-05-26 ENCOUNTER — Telehealth: Payer: Self-pay | Admitting: Hematology and Oncology

## 2015-05-26 ENCOUNTER — Ambulatory Visit (HOSPITAL_BASED_OUTPATIENT_CLINIC_OR_DEPARTMENT_OTHER): Payer: Medicare Other | Admitting: Hematology and Oncology

## 2015-05-26 ENCOUNTER — Encounter: Payer: Self-pay | Admitting: Hematology and Oncology

## 2015-05-26 VITALS — BP 141/56 | HR 60 | Temp 98.2°F | Resp 18 | Ht 65.5 in | Wt 146.7 lb

## 2015-05-26 DIAGNOSIS — M810 Age-related osteoporosis without current pathological fracture: Secondary | ICD-10-CM

## 2015-05-26 DIAGNOSIS — C50211 Malignant neoplasm of upper-inner quadrant of right female breast: Secondary | ICD-10-CM

## 2015-05-26 DIAGNOSIS — Z853 Personal history of malignant neoplasm of breast: Secondary | ICD-10-CM | POA: Diagnosis not present

## 2015-05-26 NOTE — Telephone Encounter (Signed)
Appointments made and avs printed for patient °

## 2015-05-26 NOTE — Progress Notes (Signed)
Patient Care Team: Thressa Sheller, MD as PCP - General (Internal Medicine) Terrance Mass, MD as Referring Physician (Gynecology) Erroll Luna, MD as Consulting Physician (General Surgery) Nicholas Lose, MD as Consulting Physician (Hematology and Oncology) Kyung Rudd, MD as Consulting Physician (Radiation Oncology)  DIAGNOSIS: Breast cancer of upper-inner quadrant of right female breast   Staging form: Breast, AJCC 7th Edition     Clinical: Stage IA (T1c, N0, cM0) - Unsigned       Staging comments: Staged at breast conference 06/19/14.      Pathologic: No stage assigned - Unsigned   SUMMARY OF ONCOLOGIC HISTORY:   Breast cancer of upper-inner quadrant of right female breast   06/12/2014 Mammogram Right breast architectural distortion (dense breasts); Ultrasound 1.3 cm mass at 1:00 position additional masses behind this mass measuring 1.3 cm and 0.7 cm. These lumps were palpable   06/13/2014 Initial Diagnosis Right breast: 1.3cm mass: Invasive mammary cancer probably ductal with mammary ca in situ ER 100% PR 80% gases on 30% HER-2 negative; posterior 1.3 cm mass: IDC ER 100% PR 90% gets some 20% HER-2 negative and 0.7 cm mass: DCIS   07/25/2014 Surgery Right simple mastectomy: Invasive ductal carcinoma No LVI, Pos for PNI, 3.1 cm grade 3 with DCIS grade 3, ER 100%, PR 100%, HER-2/neu negative Ki-67 20% T2, N0, M0 stage II A., Oncotype DX recurrence over 4, ROR 4%   08/14/2014 -  Anti-estrogen oral therapy Tamoxifen 20 mg daily    CHIEF COMPLIANT: tolerating tamoxifen very well  INTERVAL HISTORY: Lydia Roberts is a 69 year old with above-mentioned history of right-sided breast cancer treated with mastectomy followed by tamoxifen therapy. She is tolerating it extremely well without any hot flashes or myalgias. She denies any lumps or nodules in the breasts.  REVIEW OF SYSTEMS:   Constitutional: Denies fevers, chills or abnormal weight loss Eyes: Denies blurriness of vision Ears, nose,  mouth, throat, and face: Denies mucositis or sore throat Respiratory: Denies cough, dyspnea or wheezes Cardiovascular: Denies palpitation, chest discomfort or lower extremity swelling Gastrointestinal:  Denies nausea, heartburn or change in bowel habits Skin: Denies abnormal skin rashes Lymphatics: Denies new lymphadenopathy or easy bruising Neurological:Denies numbness, tingling or new weaknesses Behavioral/Psych: Mood is stable, no new changes  Breast:  denies any pain or lumps or nodules in either breasts All other systems were reviewed with the patient and are negative.  I have reviewed the past medical history, past surgical history, social history and family history with the patient and they are unchanged from previous note.  ALLERGIES:  is allergic to sulfur and codeine.  MEDICATIONS:  Current Outpatient Prescriptions  Medication Sig Dispense Refill  . alendronate (FOSAMAX) 70 MG tablet Take 70 mg by mouth once a week. Take with a full glass of water on an empty stomach.    . cholecalciferol (VITAMIN D) 1000 UNITS tablet Take 1,000 Units by mouth daily.    Marland Kitchen ezetimibe (ZETIA) 10 MG tablet Take 10 mg by mouth daily.    Marland Kitchen levothyroxine (SYNTHROID, LEVOTHROID) 125 MCG tablet Take 125 mcg by mouth daily.    Marland Kitchen loratadine (CLARITIN) 10 MG tablet Take 10 mg by mouth daily.    . tamoxifen (NOLVADEX) 20 MG tablet Take 1 tablet (20 mg total) by mouth daily. 90 tablet 3   No current facility-administered medications for this visit.    PHYSICAL EXAMINATION: ECOG PERFORMANCE STATUS: 0 - Asymptomatic  Filed Vitals:   05/26/15 1013  BP: 141/56  Pulse: 60  Temp:  98.2 F (36.8 C)  Resp: 18   Filed Weights   05/26/15 1013  Weight: 146 lb 11.2 oz (66.543 kg)    GENERAL:alert, no distress and comfortable SKIN: skin color, texture, turgor are normal, no rashes or significant lesions EYES: normal, Conjunctiva are pink and non-injected, sclera clear OROPHARYNX:no exudate, no erythema  and lips, buccal mucosa, and tongue normal  NECK: supple, thyroid normal size, non-tender, without nodularity LYMPH:  no palpable lymphadenopathy in the cervical, axillary or inguinal LUNGS: clear to auscultation and percussion with normal breathing effort HEART: regular rate & rhythm and no murmurs and no lower extremity edema ABDOMEN:abdomen soft, non-tender and normal bowel sounds Musculoskeletal:no cyanosis of digits and no clubbing  NEURO: alert & oriented x 3 with fluent speech, no focal motor/sensory deficits BREAST: No palpable masses or nodules in  left breasts. No palpable axillary supraclavicular or infraclavicular adenopathy no breast tenderness or nipple discharge. Right mastectomy(exam performed in the presence of a chaperone)  LABORATORY DATA:  I have reviewed the data as listed   Chemistry      Component Value Date/Time   NA 142 07/19/2014 1130   NA 139 06/19/2014 1233   K 4.5 07/19/2014 1130   K 4.2 06/19/2014 1233   CL 103 07/19/2014 1130   CO2 28 07/19/2014 1130   CO2 24 06/19/2014 1233   BUN 13 07/19/2014 1130   BUN 12.4 06/19/2014 1233   CREATININE 0.96 07/19/2014 1130   CREATININE 1.1 06/19/2014 1233      Component Value Date/Time   CALCIUM 9.8 07/19/2014 1130   CALCIUM 9.7 06/19/2014 1233   ALKPHOS 58 07/19/2014 1130   ALKPHOS 69 06/19/2014 1233   AST 17 07/19/2014 1130   AST 17 06/19/2014 1233   ALT 12 07/19/2014 1130   ALT 8 06/19/2014 1233   BILITOT 1.4* 07/19/2014 1130   BILITOT 1.46* 06/19/2014 1233       Lab Results  Component Value Date   WBC 3.8* 07/19/2014   HGB 12.8 07/19/2014   HCT 39.0 07/19/2014   MCV 89.9 07/19/2014   PLT 188 07/19/2014   NEUTROABS 1.9 07/19/2014   ASSESSMENT & PLAN:  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 N0 M0 stage IA: Pathologic stage TII, N0, M0 stage II A., 3.1 cm tumor ER/PR positive HER-2 negative, grade 3, perineural invasion present but no  lymphovascular invasion. Oncotype DX recurrence score 4, 4% ROR  Current Treatment: Adjuvant Tamoxifen X 10 years (Osteoporosis) started November 2015 Tamoxifen toxicities: No major side effects of tamoxifen Breast cancer surveillance: Mammogram to be done September 2016 Osteoporosis: patient cannot take calcium or vitamin D or bisphosphonates. I instructed her to do weightbearing exercises and yoga.  Return to clinic in 6 months for follow-up No orders of the defined types were placed in this encounter.   The patient has a good understanding of the overall plan. she agrees with it. she will call with any problems that may develop before the next visit here.   Rulon Eisenmenger, MD

## 2015-06-20 ENCOUNTER — Encounter: Payer: Self-pay | Admitting: Gynecology

## 2015-06-25 ENCOUNTER — Telehealth: Payer: Self-pay

## 2015-06-25 NOTE — Telephone Encounter (Signed)
Mammogram and ultrasound results dtd 06/18/15 rcvd from [place].  Reviewed by Dr. Lindi Adie, Sent to scan.

## 2015-08-02 ENCOUNTER — Other Ambulatory Visit: Payer: Self-pay | Admitting: Hematology and Oncology

## 2015-08-02 NOTE — Telephone Encounter (Signed)
Chart reviewed.

## 2015-11-24 NOTE — Assessment & Plan Note (Signed)
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 N0 M0 stage IA: Pathologic stage TII, N0, M0 stage II A., 3.1 cm tumor ER/PR positive HER-2 negative, grade 3, perineural invasion present but no lymphovascular invasion. Oncotype DX recurrence score 4, 4% ROR  Current Treatment: Adjuvant Tamoxifen X 10 years (Osteoporosis) started November 2015 Tamoxifen toxicities: No major side effects of tamoxifen Breast cancer surveillance: Mammogram to be done September 2016 Osteoporosis: patient cannot take calcium or vitamin D or bisphosphonates. I instructed her to do weightbearing exercises and yoga.  Return to clinic in 6 months for follow-up

## 2015-11-25 ENCOUNTER — Ambulatory Visit (HOSPITAL_BASED_OUTPATIENT_CLINIC_OR_DEPARTMENT_OTHER): Payer: Medicare Other | Admitting: Hematology and Oncology

## 2015-11-25 ENCOUNTER — Encounter: Payer: Self-pay | Admitting: Hematology and Oncology

## 2015-11-25 VITALS — BP 115/98 | HR 75 | Temp 98.1°F | Resp 18 | Wt 151.8 lb

## 2015-11-25 DIAGNOSIS — C50211 Malignant neoplasm of upper-inner quadrant of right female breast: Secondary | ICD-10-CM | POA: Diagnosis not present

## 2015-11-25 DIAGNOSIS — M810 Age-related osteoporosis without current pathological fracture: Secondary | ICD-10-CM | POA: Diagnosis not present

## 2015-11-25 NOTE — Progress Notes (Signed)
Patient Care Team: Thressa Sheller, MD as PCP - General (Internal Medicine) Terrance Mass, MD as Referring Physician (Gynecology) Erroll Luna, MD as Consulting Physician (General Surgery) Nicholas Lose, MD as Consulting Physician (Hematology and Oncology) Kyung Rudd, MD as Consulting Physician (Radiation Oncology)  DIAGNOSIS: Breast cancer of upper-inner quadrant of right female breast [facility])   Staging form: Breast, AJCC 7th Edition     Clinical: Stage IA (T1c, N0, cM0) - Unsigned       Staging comments: Staged at breast conference 06/19/14.      Pathologic: No stage assigned - Unsigned  SUMMARY OF ONCOLOGIC HISTORY:   Breast cancer of upper-inner quadrant of right female breast ([place])   06/12/2014 Mammogram Right breast architectural distortion (dense breasts); Ultrasound 1.3 cm mass at 1:00 position additional masses behind this mass measuring 1.3 cm and 0.7 cm. These lumps were palpable   06/13/2014 Initial Diagnosis Right breast: 1.3cm mass: Invasive mammary cancer probably ductal with mammary ca in situ ER 100% PR 80% gases on 30% HER-2 negative; posterior 1.3 cm mass: IDC ER 100% PR 90% gets some 20% HER-2 negative and 0.7 cm mass: DCIS   07/25/2014 Surgery Right simple mastectomy: Invasive ductal carcinoma No LVI, Pos for PNI, 3.1 cm grade 3 with DCIS grade 3, ER 100%, PR 100%, HER-2/neu negative Ki-67 20% T2, N0, M0 stage II A., Oncotype DX recurrence over 4, ROR 4%   08/14/2014 -  Anti-estrogen oral therapy Tamoxifen 20 mg daily   CHIEF COMPLIANT: Follow-up on tamoxifen therapy  INTERVAL HISTORY: Lydia Roberts is a 70 year old lady with above-mentioned history of right breast cancer whenever mastectomy and is currently on tamoxifen therapy. She is tolerating tamoxifen extremely well. She denies any hot flashes or myalgias. She denies any lumps or nodules in the breasts. She had a mammogram and ultrasound in September 2016. The results do not reveal any abnormalities of concern.  There was some architectural distortion at the site of the surgery which was further evaluated by ultrasound and it was felt to be normal.  REVIEW OF SYSTEMS:   Constitutional: Denies fevers, chills or abnormal weight loss Eyes: Denies blurriness of vision Ears, nose, mouth, throat, and face: Denies mucositis or sore throat Respiratory: Denies cough, dyspnea or wheezes Cardiovascular: Denies palpitation, chest discomfort Gastrointestinal:  Denies nausea, heartburn or change in bowel habits Skin: Denies abnormal skin rashes Lymphatics: Denies new lymphadenopathy or easy bruising Neurological:Denies numbness, tingling or new weaknesses Behavioral/Psych: Mood is stable, no new changes  Extremities: No lower extremity edema Breast:  denies any pain or lumps or nodules in either breasts All other systems were reviewed with the patient and are negative.  I have reviewed the past medical history, past surgical history, social history and family history with the patient and they are unchanged from previous note.  ALLERGIES:  is allergic to sulfur and codeine.  MEDICATIONS:  Current Outpatient Prescriptions  Medication Sig Dispense Refill  . alendronate (FOSAMAX) 70 MG tablet Take 70 mg by mouth once a week. Take with a full glass of water on an empty stomach.    . cholecalciferol (VITAMIN D) 1000 UNITS tablet Take 1,000 Units by mouth daily.    Marland Kitchen ezetimibe (ZETIA) 10 MG tablet Take 10 mg by mouth daily.    Marland Kitchen levothyroxine (SYNTHROID, LEVOTHROID) 125 MCG tablet Take 125 mcg by mouth daily.    Marland Kitchen loratadine (CLARITIN) 10 MG tablet Take 10 mg by mouth daily.    . tamoxifen (NOLVADEX) 20 MG tablet  TAKE 1 TABLET (20 MG TOTAL) BY MOUTH DAILY. 90 tablet 3   No current facility-administered medications for this visit.    PHYSICAL EXAMINATION: ECOG PERFORMANCE STATUS: 0 - Asymptomatic  Filed Vitals:   11/25/15 0955  BP: 115/98  Pulse: 75  Temp: 98.1 F (36.7 C)  Resp: 18   Filed Weights    11/25/15 0955  Weight: 151 lb 12.8 oz (68.856 kg)    GENERAL:alert, no distress and comfortable SKIN: skin color, texture, turgor are normal, no rashes or significant lesions EYES: normal, Conjunctiva are pink and non-injected, sclera clear OROPHARYNX:no exudate, no erythema and lips, buccal mucosa, and tongue normal  NECK: supple, thyroid normal size, non-tender, without nodularity LYMPH:  no palpable lymphadenopathy in the cervical, axillary or inguinal LUNGS: clear to auscultation and percussion with normal breathing effort HEART: regular rate & rhythm and no murmurs and no lower extremity edema ABDOMEN:abdomen soft, non-tender and normal bowel sounds MUSCULOSKELETAL:no cyanosis of digits and no clubbing  NEURO: alert & oriented x 3 with fluent speech, no focal motor/sensory deficits EXTREMITIES: No lower extremity edema BREAST: No palpable masses or nodules in either right or left breasts. No palpable axillary supraclavicular or infraclavicular adenopathy no breast tenderness or nipple discharge. (exam performed in the presence of a chaperone)  LABORATORY DATA:  I have reviewed the data as listed   Chemistry      Component Value Date/Time   NA 142 07/19/2014 1130   NA 139 06/19/2014 1233   K 4.5 07/19/2014 1130   K 4.2 06/19/2014 1233   CL 103 07/19/2014 1130   CO2 28 07/19/2014 1130   CO2 24 06/19/2014 1233   BUN 13 07/19/2014 1130   BUN 12.4 06/19/2014 1233   CREATININE 0.96 07/19/2014 1130   CREATININE 1.1 06/19/2014 1233      Component Value Date/Time   CALCIUM 9.8 07/19/2014 1130   CALCIUM 9.7 06/19/2014 1233   ALKPHOS 58 07/19/2014 1130   ALKPHOS 69 06/19/2014 1233   AST 17 07/19/2014 1130   AST 17 06/19/2014 1233   ALT 12 07/19/2014 1130   ALT 8 06/19/2014 1233   BILITOT 1.4* 07/19/2014 1130   BILITOT 1.46* 06/19/2014 1233      Lab Results  Component Value Date   WBC 3.8* 07/19/2014   HGB 12.8 07/19/2014   HCT 39.0 07/19/2014   MCV 89.9 07/19/2014    PLT 188 07/19/2014   NEUTROABS 1.9 07/19/2014   ASSESSMENT & PLAN:  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 N0 M0 stage IA: Pathologic stage TII, N0, M0 stage II A., 3.1 cm tumor ER/PR positive HER-2 negative, grade 3, perineural invasion present but no lymphovascular invasion. Oncotype DX recurrence score 4, 4% ROR  Current Treatment: Adjuvant Tamoxifen X 10 years (Osteoporosis) started November 2015  Tamoxifen toxicities: No major side effects of tamoxifen  Breast cancer surveillance: Mammogram to be done September 2016 Osteoporosis: patient cannot take calcium or vitamin D or bisphosphonates. I instructed her to do weightbearing exercises and yoga.  Return to clinic in 1 year for follow-up  No orders of the defined types were placed in this encounter.   The patient has a good understanding of the overall plan. she agrees with it. she will call with any problems that may develop before the next visit here.   Rulon Eisenmenger, MD 11/25/2015

## 2016-02-23 DIAGNOSIS — H2513 Age-related nuclear cataract, bilateral: Secondary | ICD-10-CM | POA: Diagnosis not present

## 2016-02-23 DIAGNOSIS — Z853 Personal history of malignant neoplasm of breast: Secondary | ICD-10-CM | POA: Diagnosis not present

## 2016-02-25 DIAGNOSIS — E785 Hyperlipidemia, unspecified: Secondary | ICD-10-CM | POA: Diagnosis not present

## 2016-02-25 DIAGNOSIS — E559 Vitamin D deficiency, unspecified: Secondary | ICD-10-CM | POA: Diagnosis not present

## 2016-02-25 DIAGNOSIS — M810 Age-related osteoporosis without current pathological fracture: Secondary | ICD-10-CM | POA: Diagnosis not present

## 2016-03-03 DIAGNOSIS — E785 Hyperlipidemia, unspecified: Secondary | ICD-10-CM | POA: Diagnosis not present

## 2016-03-03 DIAGNOSIS — E039 Hypothyroidism, unspecified: Secondary | ICD-10-CM | POA: Diagnosis not present

## 2016-03-03 DIAGNOSIS — D709 Neutropenia, unspecified: Secondary | ICD-10-CM | POA: Diagnosis not present

## 2016-05-17 DIAGNOSIS — N390 Urinary tract infection, site not specified: Secondary | ICD-10-CM | POA: Diagnosis not present

## 2016-05-26 DIAGNOSIS — N390 Urinary tract infection, site not specified: Secondary | ICD-10-CM | POA: Diagnosis not present

## 2016-05-26 DIAGNOSIS — Z23 Encounter for immunization: Secondary | ICD-10-CM | POA: Diagnosis not present

## 2016-05-31 ENCOUNTER — Other Ambulatory Visit: Payer: Self-pay

## 2016-05-31 DIAGNOSIS — C50211 Malignant neoplasm of upper-inner quadrant of right female breast: Secondary | ICD-10-CM

## 2016-05-31 MED ORDER — TAMOXIFEN CITRATE 20 MG PO TABS: ORAL_TABLET | ORAL | 0 refills | Status: DC

## 2016-06-02 ENCOUNTER — Telehealth: Payer: Self-pay | Admitting: Hematology and Oncology

## 2016-06-02 NOTE — Telephone Encounter (Signed)
lvm to inform pt of 9/20 appt per pof

## 2016-06-03 ENCOUNTER — Telehealth: Payer: Self-pay | Admitting: Hematology and Oncology

## 2016-06-03 NOTE — Telephone Encounter (Signed)
PATIENT CALLED TO [facility] APPT. 06/03/16

## 2016-06-23 ENCOUNTER — Ambulatory Visit: Payer: Medicare Other | Admitting: Hematology and Oncology

## 2016-06-29 ENCOUNTER — Encounter: Payer: Self-pay | Admitting: Gynecology

## 2016-06-29 DIAGNOSIS — Z853 Personal history of malignant neoplasm of breast: Secondary | ICD-10-CM | POA: Diagnosis not present

## 2016-06-29 DIAGNOSIS — R922 Inconclusive mammogram: Secondary | ICD-10-CM | POA: Diagnosis not present

## 2016-06-30 ENCOUNTER — Encounter: Payer: Self-pay | Admitting: Hematology and Oncology

## 2016-06-30 ENCOUNTER — Ambulatory Visit (HOSPITAL_BASED_OUTPATIENT_CLINIC_OR_DEPARTMENT_OTHER): Payer: Medicare Other | Admitting: Hematology and Oncology

## 2016-06-30 DIAGNOSIS — C50211 Malignant neoplasm of upper-inner quadrant of right female breast: Secondary | ICD-10-CM | POA: Diagnosis not present

## 2016-06-30 DIAGNOSIS — Z79810 Long term (current) use of selective estrogen receptor modulators (SERMs): Secondary | ICD-10-CM

## 2016-06-30 MED ORDER — TAMOXIFEN CITRATE 20 MG PO TABS: ORAL_TABLET | ORAL | 3 refills | Status: DC

## 2016-06-30 NOTE — Assessment & Plan Note (Signed)
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 N0 M0 stage IA: Pathologic stage TII, N0, M0 stage II A., 3.1 cm tumor ER/PR positive HER-2 negative, grade 3, perineural invasion present but no lymphovascular invasion. Oncotype DX recurrence score 4, 4% ROR  Current Treatment: Adjuvant Tamoxifen X 10 years (Osteoporosis) started November 2015  Tamoxifen toxicities: No major side effects of tamoxifen  Breast cancer surveillance: Mammogram done yesterday at [facility] was normal Osteoporosis: patient cannot take calcium or vitamin D or bisphosphonates. I instructed her to do weightbearing exercises and yoga.  Return to clinic in 1 year for follow-up 

## 2016-06-30 NOTE — Progress Notes (Signed)
Patient Care Team: Thressa Sheller, MD as PCP - General (Internal Medicine) Terrance Mass, MD as Referring Physician (Gynecology) Erroll Luna, MD as Consulting Physician (General Surgery) Nicholas Lose, MD as Consulting Physician (Hematology and Oncology) Kyung Rudd, MD as Consulting Physician (Radiation Oncology)  DIAGNOSIS: Breast cancer of upper-inner quadrant of right female breast [facility])   Staging form: Breast, AJCC 7th Edition   - Clinical: Stage IA (T1c, N0, cM0) - Unsigned         Staging comments: Staged at breast conference 06/19/14.    - Pathologic: No stage assigned - Unsigned  SUMMARY OF ONCOLOGIC HISTORY:   Breast cancer of upper-inner quadrant of right female breast ([place])   06/12/2014 Mammogram    Right breast architectural distortion (dense breasts); Ultrasound 1.3 cm mass at 1:00 position additional masses behind this mass measuring 1.3 cm and 0.7 cm. These lumps were palpable      06/13/2014 Initial Diagnosis    Right breast: 1.3cm mass: Invasive mammary cancer probably ductal with mammary ca in situ ER 100% PR 80% gases on 30% HER-2 negative; posterior 1.3 cm mass: IDC ER 100% PR 90% gets some 20% HER-2 negative and 0.7 cm mass: DCIS      07/25/2014 Surgery    Right simple mastectomy: Invasive ductal carcinoma No LVI, Pos for PNI, 3.1 cm grade 3 with DCIS grade 3, ER 100%, PR 100%, HER-2/neu negative Ki-67 20% T2, N0, M0 stage II A., Oncotype DX recurrence over 4, ROR 4%      08/14/2014 -  Anti-estrogen oral therapy    Tamoxifen 20 mg daily       CHIEF COMPLIANT: Follow-up on tamoxifen therapy  INTERVAL HISTORY: Lydia Roberts is a 70 year old with above-mentioned history of right breast cancer who underwent mastectomy and is currently on tamoxifen. She is tolerating it extremely well. She denies any hot flashes or myalgias. He is been very active and exercises every day at the [facility]. She denies any lumps or nodules in the breast. She had a mammogram today  which was normal at [place].  REVIEW OF SYSTEMS:   Constitutional: Denies fevers, chills or abnormal weight loss Eyes: Denies blurriness of vision Ears, nose, mouth, throat, and face: Denies mucositis or sore throat Respiratory: Denies cough, dyspnea or wheezes Cardiovascular: Denies palpitation, chest discomfort Gastrointestinal:  Denies nausea, heartburn or change in bowel habits Skin: Denies abnormal skin rashes Lymphatics: Denies new lymphadenopathy or easy bruising Neurological:Denies numbness, tingling or new weaknesses Behavioral/Psych: Mood is stable, no new changes  Extremities: No lower extremity edema Breast:  denies any pain or lumps or nodules in either breasts All other systems were reviewed with the patient and are negative.  I have reviewed the past medical history, past surgical history, social history and family history with the patient and they are unchanged from previous note.  ALLERGIES:  is allergic to sulfur and codeine.  MEDICATIONS:  Current Outpatient Prescriptions  Medication Sig Dispense Refill  . alendronate (FOSAMAX) 70 MG tablet Take 70 mg by mouth once a week. Take with a full glass of water on an empty stomach.    . cholecalciferol (VITAMIN D) 1000 UNITS tablet Take 1,000 Units by mouth daily.    Marland Kitchen ezetimibe (ZETIA) 10 MG tablet Take 10 mg by mouth daily.    Marland Kitchen levothyroxine (SYNTHROID, LEVOTHROID) 125 MCG tablet Take 125 mcg by mouth daily.    Marland Kitchen loratadine (CLARITIN) 10 MG tablet Take 10 mg by mouth daily.    . tamoxifen (  NOLVADEX) 20 MG tablet TAKE 1 TABLET (20 MG TOTAL) BY MOUTH DAILY. 90 tablet 3   No current facility-administered medications for this visit.     PHYSICAL EXAMINATION: ECOG PERFORMANCE STATUS: 0 - Asymptomatic  Vitals:   06/30/16 1005  BP: 119/84  Pulse: 60  Resp: 18  Temp: 98.5 F (36.9 C)   Filed Weights   06/30/16 1005  Weight: 148 lb 11.2 oz (67.4 kg)    GENERAL:alert, no distress and comfortable SKIN: skin color,  texture, turgor are normal, no rashes or significant lesions EYES: normal, Conjunctiva are pink and non-injected, sclera clear OROPHARYNX:no exudate, no erythema and lips, buccal mucosa, and tongue normal  NECK: supple, thyroid normal size, non-tender, without nodularity LYMPH:  no palpable lymphadenopathy in the cervical, axillary or inguinal LUNGS: clear to auscultation and percussion with normal breathing effort HEART: regular rate & rhythm and no murmurs and no lower extremity edema ABDOMEN:abdomen soft, non-tender and normal bowel sounds MUSCULOSKELETAL:no cyanosis of digits and no clubbing  NEURO: alert & oriented x 3 with fluent speech, no focal motor/sensory deficits EXTREMITIES: No lower extremity edema BREAST: No palpable masses or nodules in either right or left breasts. No palpable axillary supraclavicular or infraclavicular adenopathy no breast tenderness or nipple discharge. (exam performed in the presence of a chaperone)  LABORATORY DATA:  I have reviewed the data as listed   Chemistry      Component Value Date/Time   NA 142 07/19/2014 1130   NA 139 06/19/2014 1233   K 4.5 07/19/2014 1130   K 4.2 06/19/2014 1233   CL 103 07/19/2014 1130   CO2 28 07/19/2014 1130   CO2 24 06/19/2014 1233   BUN 13 07/19/2014 1130   BUN 12.4 06/19/2014 1233   CREATININE 0.96 07/19/2014 1130   CREATININE 1.1 06/19/2014 1233      Component Value Date/Time   CALCIUM 9.8 07/19/2014 1130   CALCIUM 9.7 06/19/2014 1233   ALKPHOS 58 07/19/2014 1130   ALKPHOS 69 06/19/2014 1233   AST 17 07/19/2014 1130   AST 17 06/19/2014 1233   ALT 12 07/19/2014 1130   ALT 8 06/19/2014 1233   BILITOT 1.4 (H) 07/19/2014 1130   BILITOT 1.46 (H) 06/19/2014 1233       Lab Results  Component Value Date   WBC 3.8 (L) 07/19/2014   HGB 12.8 07/19/2014   HCT 39.0 07/19/2014   MCV 89.9 07/19/2014   PLT 188 07/19/2014   NEUTROABS 1.9 07/19/2014     ASSESSMENT & PLAN:  Breast cancer of upper-inner  quadrant of right female breast 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 N0 M0 stage IA: Pathologic stage TII, N0, M0 stage II A., 3.1 cm tumor ER/PR positive HER-2 negative, grade 3, perineural invasion present but no lymphovascular invasion. Oncotype DX recurrence score 4, 4% ROR  Current Treatment: Adjuvant Tamoxifen X 10 years (Osteoporosis) started November 2015  Tamoxifen toxicities: No major side effects of tamoxifen  Breast cancer surveillance: Mammogram done yesterday at [facility] was normal Osteoporosis: patient cannot take calcium or vitamin D or bisphosphonates. I instructed her to do weightbearing exercises and yoga.  Return to clinic in 1 year for follow-up   No orders of the defined types were placed in this encounter.  The patient has a good understanding of the overall plan. she agrees with it. she will call with any problems that may develop before the next visit here.   Rulon Eisenmenger, MD  06/30/16

## 2016-07-23 ENCOUNTER — Other Ambulatory Visit: Payer: Self-pay | Admitting: Hematology and Oncology

## 2016-07-23 NOTE — Telephone Encounter (Signed)
Chart reviewed.

## 2016-09-22 ENCOUNTER — Emergency Department (HOSPITAL_COMMUNITY): Payer: Medicare Other

## 2016-09-22 ENCOUNTER — Encounter (HOSPITAL_COMMUNITY): Payer: Self-pay | Admitting: *Deleted

## 2016-09-22 ENCOUNTER — Emergency Department (HOSPITAL_COMMUNITY)
Admission: EM | Admit: 2016-09-22 | Discharge: 2016-09-22 | Disposition: A | Discharge status: Home / Self Care | Payer: Medicare Other | Attending: Emergency Medicine | Admitting: Emergency Medicine

## 2016-09-22 DIAGNOSIS — Z853 Personal history of malignant neoplasm of breast: Secondary | ICD-10-CM | POA: Diagnosis not present

## 2016-09-22 DIAGNOSIS — R112 Nausea with vomiting, unspecified: Secondary | ICD-10-CM | POA: Insufficient documentation

## 2016-09-22 DIAGNOSIS — R1030 Lower abdominal pain, unspecified: Secondary | ICD-10-CM | POA: Insufficient documentation

## 2016-09-22 DIAGNOSIS — K5730 Diverticulosis of large intestine without perforation or abscess without bleeding: Secondary | ICD-10-CM | POA: Diagnosis not present

## 2016-09-22 LAB — CBC
HCT: 40.8 % (ref 36.0–46.0)
Hemoglobin: 13.8 g/dL (ref 12.0–15.0)
MCH: 30.7 pg (ref 26.0–34.0)
MCHC: 33.8 g/dL (ref 30.0–36.0)
MCV: 90.9 fL (ref 78.0–100.0)
PLATELETS: 200 10*3/uL (ref 150–400)
RBC: 4.49 MIL/uL (ref 3.87–5.11)
RDW: 13.4 % (ref 11.5–15.5)
WBC: 6.8 10*3/uL (ref 4.0–10.5)

## 2016-09-22 LAB — URINALYSIS, ROUTINE W REFLEX MICROSCOPIC
Bilirubin Urine: NEGATIVE
GLUCOSE, UA: NEGATIVE mg/dL
HGB URINE DIPSTICK: NEGATIVE
Ketones, ur: NEGATIVE mg/dL
Leukocytes, UA: NEGATIVE
Nitrite: NEGATIVE
PROTEIN: NEGATIVE mg/dL
Specific Gravity, Urine: 1.016 (ref 1.005–1.030)
pH: 6 (ref 5.0–8.0)

## 2016-09-22 LAB — COMPREHENSIVE METABOLIC PANEL
ALBUMIN: 3.8 g/dL (ref 3.5–5.0)
ALT: 14 U/L (ref 14–54)
AST: 21 U/L (ref 15–41)
Alkaline Phosphatase: 36 U/L — ABNORMAL LOW (ref 38–126)
Anion gap: 10 (ref 5–15)
BUN: 15 mg/dL (ref 6–20)
CHLORIDE: 104 mmol/L (ref 101–111)
CO2: 22 mmol/L (ref 22–32)
CREATININE: 0.98 mg/dL (ref 0.44–1.00)
Calcium: 8.8 mg/dL — ABNORMAL LOW (ref 8.9–10.3)
GFR calc Af Amer: >60 mL/min (ref >60)
GFR calc non Af Amer: 57 mL/min — ABNORMAL LOW (ref >60)
GLUCOSE: 127 mg/dL — AB (ref 65–99)
Potassium: 3.7 mmol/L (ref 3.5–5.1)
Sodium: 136 mmol/L (ref 135–145)
Total Bilirubin: 1.6 mg/dL — ABNORMAL HIGH (ref 0.3–1.2)
Total Protein: 6.7 g/dL (ref 6.5–8.1)

## 2016-09-22 LAB — POC OCCULT BLOOD, ED: Fecal Occult Bld: NEGATIVE

## 2016-09-22 LAB — LIPASE, BLOOD: LIPASE: 27 U/L (ref 11–51)

## 2016-09-22 MED ORDER — IOPAMIDOL (ISOVUE-300) INJECTION 61%
INTRAVENOUS | Status: AC
  Administered 2016-09-22: 100 mL
  Filled 2016-09-22: qty 100

## 2016-09-22 MED ORDER — POLYETHYLENE GLYCOL 3350 17 G PO PACK: 17.0000 g | PACK | Freq: Every day | ORAL | 0 refills | Status: DC

## 2016-09-22 MED ORDER — ONDANSETRON HCL 4 MG PO TABS: 4.0000 mg | ORAL_TABLET | Freq: Three times a day (TID) | ORAL | 0 refills | Status: DC | PRN

## 2016-09-22 NOTE — ED Provider Notes (Signed)
[facility] DEPT Provider Note   CSN: [id number] Arrival date & time: 09/22/16  1131     History   Chief Complaint Chief Complaint  Patient presents with  . Abdominal Pain  . Emesis    HPI Lydia Roberts is a 70 y.o. female.  HPI   70 year old female with history of breast cancer currently on tamoxifen, recurrent urinary tract infection, thyroid disease, anxiety presenting to the ED from home for evaluation of low abdominal pain. Patient developed pain to her right lower quadrant since yesterday. She described pain as "pain" with associate nausea and multiple bouts of nonbloody nonbilious vomitus. She has a small amount of stools yesterday but none today. Report mild generalized fatigue. She has no urge to have a bowel movement and no urge to pass flatus. Her pain is episodic and currently pain-free. However earlier this morning her pain was severe. She denies any associated fever, chills, lightheadedness, dizziness, chest pain, shortness of breath, productive cough, back pain, dysuria, hematuria, hematochezia or melena. No prior abdominal surgery. Had a colonoscopy 5 years ago and states that it was normal. She denies any specific treatment tried. She normally has bowel movements on a daily basis.  Past Medical History:  Diagnosis Date  . Anxiety   . Arthritis   . Breast cancer ([place]) 1993  . Cancer ([place])   . Osteoporosis   . Recurrent UTI   . Thyroid disease     Patient Active Problem List   Diagnosis Date Noted  . Breast cancer of upper-inner quadrant of right female breast ([place]) 06/17/2014  . History of breast cancer 01/17/2014  . Osteoporosis 01/17/2014    Past Surgical History:  Procedure Laterality Date  . BREAST SURGERY  1993   rt lump-axillary node dissection  . COLONOSCOPY    . SIMPLE MASTECTOMY WITH AXILLARY SENTINEL NODE BIOPSY Right 07/25/2014   Procedure: RIGHT SIMPLE MASTECTOMY;  Surgeon: Erroll Luna, MD;  Location: [facility];   Service: General;  Laterality: Right;  . TONSILLECTOMY    . TUBAL LIGATION      OB History    Gravida Para Term Preterm AB Living   3 3       3    SAB TAB Ectopic Multiple Live Births                   Home Medications    Prior to Admission medications   Medication Sig Start Date End Date Taking? Authorizing Provider  alendronate (FOSAMAX) 70 MG tablet Take 70 mg by mouth once a week. Take with a full glass of water on an empty stomach.    Historical Provider, MD  cholecalciferol (VITAMIN D) 1000 UNITS tablet Take 1,000 Units by mouth daily.    Historical Provider, MD  ezetimibe (ZETIA) 10 MG tablet Take 10 mg by mouth daily.    Historical Provider, MD  levothyroxine (SYNTHROID, LEVOTHROID) 125 MCG tablet Take 125 mcg by mouth daily.    Historical Provider, MD  loratadine (CLARITIN) 10 MG tablet Take 10 mg by mouth daily.    Historical Provider, MD  tamoxifen (NOLVADEX) 20 MG tablet TAKE 1 TABLET (20 MG TOTAL) BY MOUTH DAILY. 06/30/16   Nicholas Lose, MD  tamoxifen (NOLVADEX) 20 MG tablet TAKE 1 TABLET (20 MG TOTAL) BY MOUTH DAILY. 07/23/16   Nicholas Lose, MD    Family History Family History  Problem Relation Age of Onset  . Diabetes Sister   . Diabetes Brother   . Diabetes Brother   .  Thyroid disease Daughter   . Kidney cancer Father 66    deceased 29  . Breast cancer Paternal Aunt     Dx 55s; died at 46; Had 7 sisters who are cancer-free  . Breast cancer Cousin     Social History Social History  Substance Use Topics  . Smoking status: Never Smoker  . Smokeless tobacco: Never Used  . Alcohol use No     Allergies   Sulfur and Codeine   Review of Systems Review of Systems  All other systems reviewed and are negative.    Physical Exam Updated Vital Signs BP 122/62   Pulse 97   Temp 98.7 F (37.1 C) (Oral)   Resp 17   SpO2 96%   Physical Exam  Constitutional: She appears well-developed and well-nourished. No distress.  HENT:  Head: Atraumatic.    Mouth/Throat: Oropharynx is clear and moist.  Eyes: Conjunctivae are normal.  Neck: Neck supple.  Cardiovascular: Normal rate and regular rhythm.   Pulmonary/Chest: Effort normal and breath sounds normal.  Abdominal: Soft. Bowel sounds are normal. She exhibits no distension. There is no tenderness.  Genitourinary:  Genitourinary Comments: Chaperone present during exam. Normal rectal tone, nonthrombosed external hemorrhoid, not actively bleeding. Stool noted in rectal vault but not impacted. Normal stool color on glove.  Neurological: She is alert.  Skin: No rash noted.  Psychiatric: She has a normal mood and affect.  Nursing note and vitals reviewed.    ED Treatments / Results  Labs (all labs ordered are listed, but only abnormal results are displayed) Labs Reviewed  COMPREHENSIVE METABOLIC PANEL - Abnormal; Notable for the following:       Result Value   Glucose, Bld 127 (*)    Calcium 8.8 (*)    Alkaline Phosphatase 36 (*)    Total Bilirubin 1.6 (*)    GFR calc non Af Amer 57 (*)    All other components within normal limits  LIPASE, BLOOD  CBC  URINALYSIS, ROUTINE W REFLEX MICROSCOPIC  POC OCCULT BLOOD, ED    EKG  EKG Interpretation None       Radiology Ct Abdomen Pelvis W Contrast  Result Date: 09/22/2016 CLINICAL DATA:  Right lower quadrant abdominal pain onset yesterday. Nausea vomiting. EXAM: CT ABDOMEN AND PELVIS WITH CONTRAST TECHNIQUE: Multidetector CT imaging of the abdomen and pelvis was performed using the standard protocol following bolus administration of intravenous contrast. CONTRAST:  157mL ISOVUE-300 IOPAMIDOL (ISOVUE-300) INJECTION 61% COMPARISON:  10/19/2012 FINDINGS: Lower chest: Unremarkable Hepatobiliary: The dome of the liver was inadvertently excluded. Cystic lesion in segment 4 measures 1.4 by 2.4 cm on image 8/2, formerly 1.5 by 2.3 cm by my measurements. A smaller hypodense lesion in the lateral segment left hepatic lobe measures 0.6 cm in  short axis, formerly 0.5 cm back on 10/19/2012. I expect that both of these lesions are small benign cysts. Gallbladder unremarkable. No biliary dilatation. 3 mm hypodense lesion inferiorly in the right hepatic lobe on image 22/2, stable. Pancreas: Unremarkable Spleen: Unremarkable Adrenals/Urinary Tract: Unremarkable Stomach/Bowel: Sigmoid diverticulosis without active diverticulitis. Appendix normal. Terminal ileum unremarkable. No significant bowel abnormality is observed. Vascular/Lymphatic: Aortoiliac atherosclerotic vascular disease. No pathologic adenopathy identified. Reproductive: Unremarkable Other: There are 2 presacral soft tissue nodules. The first of these eccentric to the right measures 1.7 by 3.1 by 1.8 cm and appears to have faint internal fatty elements. The second nodule more eccentric to the left likewise has faint internal fatty elements and measures 1.2 by 1.7 by 1.2  cm. Both lesions are larger and more conspicuous than on the 2014 exam. Musculoskeletal: Chronic appearing superior endplate compression fracture at L4 with 3 mm degenerative grade 1 anterolisthesis at L4-5 and a notable central disc protrusion at L4-5 causing prominent central narrowing of the thecal sac. Partially sacralized L5. IMPRESSION: 1. Appendix normal, a specific cause for lower quadrant abdominal pain and nausea not identified. 2. Sigmoid diverticulosis without active diverticulitis. 3. Mild enlargement of 2 presacral nodules with fatty internal elements, a fairly characteristic appearance for presacral myelolipoma. Differential diagnostic consideration may include presacral hemangioma. 4. Several hypodense liver lesions are fairly stable and compatible with cysts. 5.  Aortoiliac atherosclerotic vascular disease. 6. Prominent central narrowing of the thecal sac at L4-5 due to subluxation and a prominent central disc protrusion. Chronic appearing superior endplate compression fracture at L4. Electronically Signed   By:  Van Clines M.D.   On: 09/22/2016 14:59    Procedures Procedures (including critical care time)  Medications Ordered in ED Medications  iopamidol (ISOVUE-300) 61 % injection (100 mLs  Contrast Given 09/22/16 1428)     Initial Impression / Assessment and Plan / ED Course  I have reviewed the triage vital signs and the nursing notes.  Pertinent labs & imaging results that were available during my care of the patient were reviewed by me and considered in my medical decision making (see chart for details).  Clinical Course     BP 122/57   Pulse 86   Temp 98.7 F (37.1 C) (Oral)   Resp 19   SpO2 95%    Final Clinical Impressions(s) / ED Diagnoses   Final diagnoses:  Lower abdominal pain  Non-intractable vomiting with nausea, unspecified vomiting type    New Prescriptions New Prescriptions   No medications on file   1:07 PM Elderly female here with right lower quadrant abdominal pain. She still has intact appendix. Abdominal exam is unremarkable. She is afebrile with stable normal vital sign and well-appearing. Given her age, remote history of breast cancer, she will benefit from an abdominal and pelvis CT scan to rule out acute abdominal pathology.  Care discussed with Dr. Canary Brim.   3:14 PM Labs are reassuring. Mildly elevated total bilirubin but this is chronic. Her abdominal and pelvis CT scan without any acute finding. There are some incidental finding that I share with patient. She is currently well-appearing and in no acute discomfort. She is stable for discharge. I will prescribe Miralax to help with her bowel movement. Antinausea medication prescribed to use as needed. Return precaution discussed.   Domenic Moras, PA-C 09/22/16 [place], MD 09/22/16 [phone]

## 2016-09-22 NOTE — Discharge Instructions (Signed)
Take Miralax to help with constipation.  Take Zofran as needed for nausea.  Follow up with your doctor for further care.  Return to the ER if your condition worsen or if you have other concerns.

## 2016-09-22 NOTE — ED Notes (Signed)
Patient went to CT

## 2016-09-22 NOTE — ED Triage Notes (Signed)
PT arrived RLQ intermittent all week and then resolved.  Yesterday felt bad and then started vomiting last nite Q15 minutes and feels weak.

## 2016-10-05 DIAGNOSIS — M810 Age-related osteoporosis without current pathological fracture: Secondary | ICD-10-CM | POA: Diagnosis not present

## 2016-10-05 DIAGNOSIS — N390 Urinary tract infection, site not specified: Secondary | ICD-10-CM | POA: Diagnosis not present

## 2016-10-05 DIAGNOSIS — E785 Hyperlipidemia, unspecified: Secondary | ICD-10-CM | POA: Diagnosis not present

## 2016-10-05 DIAGNOSIS — Z0000 Encounter for general adult medical examination without abnormal findings: Secondary | ICD-10-CM | POA: Diagnosis not present

## 2016-10-05 DIAGNOSIS — E559 Vitamin D deficiency, unspecified: Secondary | ICD-10-CM | POA: Diagnosis not present

## 2016-10-12 DIAGNOSIS — E559 Vitamin D deficiency, unspecified: Secondary | ICD-10-CM | POA: Diagnosis not present

## 2016-10-12 DIAGNOSIS — E039 Hypothyroidism, unspecified: Secondary | ICD-10-CM | POA: Diagnosis not present

## 2016-10-12 DIAGNOSIS — Z1212 Encounter for screening for malignant neoplasm of rectum: Secondary | ICD-10-CM | POA: Diagnosis not present

## 2016-10-12 DIAGNOSIS — M810 Age-related osteoporosis without current pathological fracture: Secondary | ICD-10-CM | POA: Diagnosis not present

## 2016-10-12 DIAGNOSIS — E7800 Pure hypercholesterolemia, unspecified: Secondary | ICD-10-CM | POA: Diagnosis not present

## 2016-10-12 DIAGNOSIS — Z0000 Encounter for general adult medical examination without abnormal findings: Secondary | ICD-10-CM | POA: Diagnosis not present

## 2016-10-19 DIAGNOSIS — N6459 Other signs and symptoms in breast: Secondary | ICD-10-CM | POA: Diagnosis not present

## 2016-10-19 DIAGNOSIS — N6012 Diffuse cystic mastopathy of left breast: Secondary | ICD-10-CM | POA: Diagnosis not present

## 2016-11-08 ENCOUNTER — Ambulatory Visit (INDEPENDENT_AMBULATORY_CARE_PROVIDER_SITE_OTHER): Payer: Medicare Other

## 2016-11-08 ENCOUNTER — Encounter (INDEPENDENT_AMBULATORY_CARE_PROVIDER_SITE_OTHER): Payer: Self-pay | Admitting: Orthopedic Surgery

## 2016-11-08 ENCOUNTER — Ambulatory Visit (INDEPENDENT_AMBULATORY_CARE_PROVIDER_SITE_OTHER): Payer: Medicare Other | Admitting: Orthopedic Surgery

## 2016-11-08 DIAGNOSIS — M79662 Pain in left lower leg: Secondary | ICD-10-CM

## 2016-11-08 DIAGNOSIS — M79672 Pain in left foot: Secondary | ICD-10-CM | POA: Diagnosis not present

## 2016-11-08 NOTE — Progress Notes (Signed)
Office Visit Note   Patient: Lydia Roberts           Date of Birth: 1946-01-12           MRN: [id number] Visit Date: 11/08/2016 Requested by: Thressa Sheller, MD [address] [place], [place] [address] PCP: Thressa Sheller, MD  Subjective: Chief Complaint  Patient presents with  . Left Leg - Pain  . Left Foot - Pain    HPI [name] is a 70 year old female with left foot and tibial pain.  She does have a history of osteoporosis and takes tamoxifen for breast cancer.  She has not been recently immobilized.  She describes pain on the anterior tibia and no calf pain.  Feels like she might have a knot on her tibia.  Her pain is to get worse this morning.  It hurts her with some steps that she takes but not necessarily every step.  Denies any numbness and tingling in the leg and no back pain.  She also reports some left foot pain which is generally diffuse.  She is not a smoker and hasn't recently traveled.  She is not taking any medication for the problem.              Review of Systems All systems reviewed are negative as they relate to the chief complaint within the history of present illness.  Patient denies  fevers or chills.    Assessment & Plan: Visit Diagnoses:  1. Pain in left shin   2. Pain in left foot     Plan: Impression is left anterior tibial pain.  This could be a stress reaction which we cannot see on plain radiographs.  She does have a history of osteoporosis.  No destructive bony lesions present in the tibia.  She does have a history of breast cancer.  No calf tenderness.  DVT unlikely.  Plan is observation with MRI scanning in 3 weeks if she is not improved.  She does walk for exercise but I want her to modify her activities at this time in order to enhance the chance of healing if this is a early stress reaction and tibia.  Shin splints less likely because of her lack of posterior medial tibial tenderness.  Follow-Up Instructions: No Follow-up on file.    Orders:  Orders Placed This Encounter  Procedures  . XR Tibia/Fibula Left  . XR Foot Complete Left   No orders of the defined types were placed in this encounter.     Procedures: No procedures performed   Clinical Data: No additional findings.  Objective: Vital Signs: There were no vitals taken for this visit.  Physical Exam   Constitutional: Patient appears well-developed HEENT:  Head: Normocephalic Eyes:EOM are normal Neck: Normal range of motion Cardiovascular: Normal rate Pulmonary/chest: Effort normal Neurologic: Patient is alert Skin: Skin is warm Psychiatric: Patient has normal mood and affect    Ortho Exam examination demonstrates normal gait alignment palpable pedal pulses bilaterally she has no calf swelling left versus right.  Negative Homans testing on the left-hand side she has good ankle dorsi flexion plantar flexion inversion eversion strength.  Palpable intact nontender anterior to posterior tib peroneal and Achilles tendon.  She has diffuse tenderness along the anterior crest of the tibia but there is no posterior medial tibial tenderness on the left-hand side.  Compartments are soft.  Knee range of motion is full and tibiotalar subtalar range of motion is full  Specialty Comments:  No specialty comments  available.  Imaging: No results found.   PMFS History: Patient Active Problem List   Diagnosis Date Noted  . Pain in left shin 11/08/2016  . Pain in left foot 11/08/2016  . Breast cancer of upper-inner quadrant of right female breast ([place]) 06/17/2014  . History of breast cancer 01/17/2014  . Osteoporosis 01/17/2014   Past Medical History:  Diagnosis Date  . Anxiety   . Arthritis   . Breast cancer ([place]) 1993  . Cancer ([place])   . Osteoporosis   . Recurrent UTI   . Thyroid disease     Family History  Problem Relation Age of Onset  . Diabetes Sister   . Diabetes Brother   . Diabetes Brother   . Thyroid disease Daughter   . Kidney  cancer Father 35    deceased 40  . Breast cancer Paternal Aunt     Dx 7s; died at 61; Had 7 sisters who are cancer-free  . Breast cancer Cousin     Past Surgical History:  Procedure Laterality Date  . BREAST SURGERY  1993   rt lump-axillary node dissection  . COLONOSCOPY    . SIMPLE MASTECTOMY WITH AXILLARY SENTINEL NODE BIOPSY Right 07/25/2014   Procedure: RIGHT SIMPLE MASTECTOMY;  Surgeon: Erroll Luna, MD;  Location: [facility];  Service: General;  Laterality: Right;  . TONSILLECTOMY    . TUBAL LIGATION     Social History   Occupational History  . Not on file.   Social History Main Topics  . Smoking status: Never Smoker  . Smokeless tobacco: Never Used  . Alcohol use No  . Drug use: No  . Sexual activity: Yes    Birth control/ protection: None, Post-menopausal

## 2016-11-29 ENCOUNTER — Ambulatory Visit (INDEPENDENT_AMBULATORY_CARE_PROVIDER_SITE_OTHER): Payer: Medicare Other | Admitting: Orthopedic Surgery

## 2017-02-16 ENCOUNTER — Encounter: Payer: Self-pay | Admitting: Gynecology

## 2017-02-25 DIAGNOSIS — H2513 Age-related nuclear cataract, bilateral: Secondary | ICD-10-CM | POA: Diagnosis not present

## 2017-03-22 DIAGNOSIS — Z853 Personal history of malignant neoplasm of breast: Secondary | ICD-10-CM | POA: Diagnosis not present

## 2017-04-19 ENCOUNTER — Other Ambulatory Visit: Payer: Self-pay | Admitting: Hematology and Oncology

## 2017-04-19 DIAGNOSIS — C50211 Malignant neoplasm of upper-inner quadrant of right female breast: Secondary | ICD-10-CM

## 2017-06-21 DIAGNOSIS — Z853 Personal history of malignant neoplasm of breast: Secondary | ICD-10-CM | POA: Diagnosis not present

## 2017-06-21 DIAGNOSIS — R922 Inconclusive mammogram: Secondary | ICD-10-CM | POA: Diagnosis not present

## 2017-06-30 NOTE — Assessment & Plan Note (Signed)
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 N0 M0 stage IA: Pathologic stage TII, N0, M0 stage II A., 3.1 cm tumor ER/PR positive HER-2 negative, grade 3, perineural invasion present but no lymphovascular invasion. Oncotype DX recurrence score 4, 4% ROR  Current Treatment: Adjuvant Tamoxifen X 10 years (Osteoporosis) started November 2015  Tamoxifen toxicities: No major side effects of tamoxifen  Breast cancer surveillance: Mammogram done yesterday at [facility] was normal Osteoporosis: patient cannot take calcium or vitamin D or bisphosphonates. I instructed her to do weightbearing exercises and yoga.  Return to clinic in 1 year for follow-up 

## 2017-07-01 ENCOUNTER — Ambulatory Visit (HOSPITAL_BASED_OUTPATIENT_CLINIC_OR_DEPARTMENT_OTHER): Payer: Medicare Other | Admitting: Hematology and Oncology

## 2017-07-01 DIAGNOSIS — M818 Other osteoporosis without current pathological fracture: Secondary | ICD-10-CM | POA: Diagnosis not present

## 2017-07-01 DIAGNOSIS — C50211 Malignant neoplasm of upper-inner quadrant of right female breast: Secondary | ICD-10-CM

## 2017-07-01 DIAGNOSIS — Z170 Estrogen receptor positive status [ER+]: Secondary | ICD-10-CM | POA: Diagnosis not present

## 2017-07-01 MED ORDER — TAMOXIFEN CITRATE 20 MG PO TABS: 20.0000 mg | ORAL_TABLET | Freq: Every day | ORAL | 3 refills | Status: DC

## 2017-07-01 NOTE — Progress Notes (Signed)
Patient Care Team: Thressa Sheller, MD as PCP - General (Internal Medicine) Terrance Mass, MD as Referring Physician (Gynecology) Erroll Luna, MD as Consulting Physician (General Surgery) Nicholas Lose, MD as Consulting Physician (Hematology and Oncology) Kyung Rudd, MD as Consulting Physician (Radiation Oncology)  DIAGNOSIS:  Encounter Diagnosis  Name Primary?  . Malignant neoplasm of upper-inner quadrant of right breast in female, estrogen receptor positive ([place])     SUMMARY OF ONCOLOGIC HISTORY:   Breast cancer of upper-inner quadrant of right female breast ([place])   06/12/2014 Mammogram    Right breast architectural distortion (dense breasts); Ultrasound 1.3 cm mass at 1:00 position additional masses behind this mass measuring 1.3 cm and 0.7 cm. These lumps were palpable      06/13/2014 Initial Diagnosis    Right breast: 1.3cm mass: Invasive mammary cancer probably ductal with mammary ca in situ ER 100% PR 80% gases on 30% HER-2 negative; posterior 1.3 cm mass: IDC ER 100% PR 90% gets some 20% HER-2 negative and 0.7 cm mass: DCIS      07/25/2014 Surgery    Right simple mastectomy: Invasive ductal carcinoma No LVI, Pos for PNI, 3.1 cm grade 3 with DCIS grade 3, ER 100%, PR 100%, HER-2/neu negative Ki-67 20% T2, N0, M0 stage II A., Oncotype DX recurrence over 4, ROR 4%      08/14/2014 -  Anti-estrogen oral therapy    Tamoxifen 20 mg daily       CHIEF COMPLIANT: Follow-up on tamoxifen  INTERVAL HISTORY: Lydia Roberts is a 71 year old with above-mentioned history of right breast cancer treated with mastectomy and is currently on tamoxifen therapy. She is tolerating tamoxifen extremely well. She denies any hot flashes or myalgias. She denies any lumps or nodules in the breast. He stays active by doing yoga and exercise regularly.  REVIEW OF SYSTEMS:   Constitutional: Denies fevers, chills or abnormal weight loss Eyes: Denies blurriness of vision Ears, nose, mouth,  throat, and face: Denies mucositis or sore throat Respiratory: Denies cough, dyspnea or wheezes Cardiovascular: Denies palpitation, chest discomfort Gastrointestinal:  Denies nausea, heartburn or change in bowel habits Skin: Denies abnormal skin rashes Lymphatics: Denies new lymphadenopathy or easy bruising Neurological:Denies numbness, tingling or new weaknesses Behavioral/Psych: Mood is stable, no new changes  Extremities: No lower extremity edema Breast:  denies any pain or lumps or nodules in either breasts All other systems were reviewed with the patient and are negative.  I have reviewed the past medical history, past surgical history, social history and family history with the patient and they are unchanged from previous note.  ALLERGIES:  is allergic to sulfur and codeine.  MEDICATIONS:  Current Outpatient Prescriptions  Medication Sig Dispense Refill  . cholecalciferol (VITAMIN D) 1000 UNITS tablet Take 1,000 Units by mouth daily.    Marland Kitchen ezetimibe (ZETIA) 10 MG tablet Take 10 mg by mouth daily.    Marland Kitchen levothyroxine (SYNTHROID, LEVOTHROID) 125 MCG tablet Take 125 mcg by mouth daily.    . tamoxifen (NOLVADEX) 20 MG tablet Take 1 tablet (20 mg total) by mouth daily. 90 tablet 3   No current facility-administered medications for this visit.     PHYSICAL EXAMINATION: ECOG PERFORMANCE STATUS: 1 - Symptomatic but completely ambulatory  Vitals:   07/01/17 0814  BP: 134/61  Pulse: 70  Resp: 18  Temp: 98.7 F (37.1 C)  SpO2: 100%   Filed Weights   07/01/17 0814  Weight: 151 lb 11.2 oz (68.8 kg)    GENERAL:alert, no  distress and comfortable SKIN: skin color, texture, turgor are normal, no rashes or significant lesions EYES: normal, Conjunctiva are pink and non-injected, sclera clear OROPHARYNX:no exudate, no erythema and lips, buccal mucosa, and tongue normal  NECK: supple, thyroid normal size, non-tender, without nodularity LYMPH:  no palpable lymphadenopathy in the  cervical, axillary or inguinal LUNGS: clear to auscultation and percussion with normal breathing effort HEART: regular rate & rhythm and no murmurs and no lower extremity edema ABDOMEN:abdomen soft, non-tender and normal bowel sounds MUSCULOSKELETAL:no cyanosis of digits and no clubbing  NEURO: alert & oriented x 3 with fluent speech, no focal motor/sensory deficits EXTREMITIES: No lower extremity edema BREAST: No palpable masses or nodules in either right or left breasts. No palpable axillary supraclavicular or infraclavicular adenopathy no breast tenderness or nipple discharge. (exam performed in the presence of a chaperone)  LABORATORY DATA:  I have reviewed the data as listed   Chemistry      Component Value Date/Time   NA 136 09/22/2016 1114   NA 139 06/19/2014 1233   K 3.7 09/22/2016 1114   K 4.2 06/19/2014 1233   CL 104 09/22/2016 1114   CO2 22 09/22/2016 1114   CO2 24 06/19/2014 1233   BUN 15 09/22/2016 1114   BUN 12.4 06/19/2014 1233   CREATININE 0.98 09/22/2016 1114   CREATININE 1.1 06/19/2014 1233      Component Value Date/Time   CALCIUM 8.8 (L) 09/22/2016 1114   CALCIUM 9.7 06/19/2014 1233   ALKPHOS 36 (L) 09/22/2016 1114   ALKPHOS 69 06/19/2014 1233   AST 21 09/22/2016 1114   AST 17 06/19/2014 1233   ALT 14 09/22/2016 1114   ALT 8 06/19/2014 1233   BILITOT 1.6 (H) 09/22/2016 1114   BILITOT 1.46 (H) 06/19/2014 1233       Lab Results  Component Value Date   WBC 6.8 09/22/2016   HGB 13.8 09/22/2016   HCT 40.8 09/22/2016   MCV 90.9 09/22/2016   PLT 200 09/22/2016   NEUTROABS 1.9 07/19/2014    ASSESSMENT & PLAN:  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 N0 M0 stage IA: Pathologic stage TII, N0, M0 stage II A., 3.1 cm tumor ER/PR positive HER-2 negative, grade 3, perineural invasion present but no lymphovascular invasion. Oncotype DX recurrence  score 4, 4% ROR  Current Treatment: Adjuvant Tamoxifen X 10 years (Osteoporosis) started November 2015  Tamoxifen toxicities: No major side effects of tamoxifen Breast cancer surveillance:  1. Mammogram done last week at [facility] was normal 2. breast exam 9 28,018: Right mastectomy no palpable lumps or nodules, left breast no lumps or nodules. Osteoporosis: patient cannot take calcium or vitamin D or bisphosphonates. I instructed her to do weightbearing exercises and yoga.  Return to clinic in 1 year for follow-up  I spent 15 minutes talking to the patient of which more than half was spent in counseling and coordination of care.  No orders of the defined types were placed in this encounter.  The patient has a good understanding of the overall plan. she agrees with it. she will call with any problems that may develop before the next visit here.   Rulon Eisenmenger, MD 07/01/17

## 2017-07-04 ENCOUNTER — Telehealth: Payer: Self-pay | Admitting: Hematology and Oncology

## 2017-07-04 NOTE — Telephone Encounter (Signed)
Scheduled appt per 9/28 los - sent reminder letter in the mail. - f/u in one year.

## 2017-07-15 DIAGNOSIS — Z23 Encounter for immunization: Secondary | ICD-10-CM | POA: Diagnosis not present

## 2017-10-11 DIAGNOSIS — E785 Hyperlipidemia, unspecified: Secondary | ICD-10-CM | POA: Diagnosis not present

## 2017-10-11 DIAGNOSIS — Z0000 Encounter for general adult medical examination without abnormal findings: Secondary | ICD-10-CM | POA: Diagnosis not present

## 2017-10-11 DIAGNOSIS — E039 Hypothyroidism, unspecified: Secondary | ICD-10-CM | POA: Diagnosis not present

## 2017-10-11 DIAGNOSIS — N183 Chronic kidney disease, stage 3 (moderate): Secondary | ICD-10-CM | POA: Diagnosis not present

## 2017-10-11 DIAGNOSIS — N390 Urinary tract infection, site not specified: Secondary | ICD-10-CM | POA: Diagnosis not present

## 2017-10-11 DIAGNOSIS — E559 Vitamin D deficiency, unspecified: Secondary | ICD-10-CM | POA: Diagnosis not present

## 2017-10-18 DIAGNOSIS — Z1212 Encounter for screening for malignant neoplasm of rectum: Secondary | ICD-10-CM | POA: Diagnosis not present

## 2017-10-18 DIAGNOSIS — Z0000 Encounter for general adult medical examination without abnormal findings: Secondary | ICD-10-CM | POA: Diagnosis not present

## 2017-10-18 DIAGNOSIS — Z853 Personal history of malignant neoplasm of breast: Secondary | ICD-10-CM | POA: Diagnosis not present

## 2017-10-18 DIAGNOSIS — E7800 Pure hypercholesterolemia, unspecified: Secondary | ICD-10-CM | POA: Diagnosis not present

## 2017-10-18 DIAGNOSIS — E039 Hypothyroidism, unspecified: Secondary | ICD-10-CM | POA: Diagnosis not present

## 2017-10-18 DIAGNOSIS — E559 Vitamin D deficiency, unspecified: Secondary | ICD-10-CM | POA: Diagnosis not present

## 2018-02-06 DIAGNOSIS — H00021 Hordeolum internum right upper eyelid: Secondary | ICD-10-CM | POA: Diagnosis not present

## 2018-02-06 MED FILL — TOBRAMYCIN-DEXAMETH OPTH SU: 0.3-0.1 | 25 days supply | Qty: 5 | Fill #0

## 2018-02-28 DIAGNOSIS — H2513 Age-related nuclear cataract, bilateral: Secondary | ICD-10-CM | POA: Diagnosis not present

## 2018-02-28 DIAGNOSIS — H11153 Pinguecula, bilateral: Secondary | ICD-10-CM | POA: Diagnosis not present

## 2018-04-08 ENCOUNTER — Other Ambulatory Visit: Payer: Self-pay | Admitting: Hematology and Oncology

## 2018-04-08 DIAGNOSIS — C50211 Malignant neoplasm of upper-inner quadrant of right female breast: Secondary | ICD-10-CM

## 2018-04-08 DIAGNOSIS — Z170 Estrogen receptor positive status [ER+]: Principal | ICD-10-CM

## 2018-05-24 ENCOUNTER — Encounter: Payer: Self-pay | Admitting: Podiatry

## 2018-05-24 ENCOUNTER — Ambulatory Visit: Payer: Medicare Other | Admitting: Podiatry

## 2018-05-24 VITALS — BP 120/75 | HR 64

## 2018-05-24 DIAGNOSIS — L309 Dermatitis, unspecified: Secondary | ICD-10-CM

## 2018-05-24 NOTE — Progress Notes (Signed)
Subjective:   Patient ID: Lydia Roberts, female   DOB: 72 y.o.   MRN: [id number]   HPI Patient states she started developed some irritation between the hallux and second toes of both feet and she was concerned it could be a bacterial infection and wanted it checked.  Patient does not smoke likes to be active   Review of Systems  All other systems reviewed and are negative.       Objective:  Physical Exam  Constitutional: She appears well-developed and well-nourished.  Cardiovascular: Intact distal pulses.  Pulmonary/Chest: Effort normal.  Musculoskeletal: Normal range of motion.  Neurological: She is alert.  Skin: Skin is warm.  Nursing note and vitals reviewed.   Neurovascular status intact muscle strength is adequate range of motion within normal limits with patient noted to have slight breakdown between the hallux second toe right over left localized with no proximal edema erythema or drainage noted.  Good digital perfusion well oriented x3     Assessment:  Appears to be small irritative tissue bilateral which is probably related to sweating or other localized pathology     Plan:  H&P condition reviewed and I recommended utilizing topical powders along with antifungal cream as needed.  Patient will be seen back if symptoms get worse or problems occur

## 2018-06-10 ENCOUNTER — Other Ambulatory Visit: Payer: Self-pay | Admitting: Hematology and Oncology

## 2018-06-30 ENCOUNTER — Telehealth: Payer: Self-pay | Admitting: Hematology and Oncology

## 2018-06-30 ENCOUNTER — Inpatient Hospital Stay: Payer: Medicare Other | Attending: Hematology and Oncology | Admitting: Hematology and Oncology

## 2018-06-30 DIAGNOSIS — C50211 Malignant neoplasm of upper-inner quadrant of right female breast: Secondary | ICD-10-CM | POA: Diagnosis not present

## 2018-06-30 DIAGNOSIS — Z79810 Long term (current) use of selective estrogen receptor modulators (SERMs): Secondary | ICD-10-CM | POA: Diagnosis not present

## 2018-06-30 DIAGNOSIS — Z9011 Acquired absence of right breast and nipple: Secondary | ICD-10-CM | POA: Diagnosis not present

## 2018-06-30 DIAGNOSIS — Z170 Estrogen receptor positive status [ER+]: Secondary | ICD-10-CM | POA: Diagnosis not present

## 2018-06-30 DIAGNOSIS — M810 Age-related osteoporosis without current pathological fracture: Secondary | ICD-10-CM | POA: Insufficient documentation

## 2018-06-30 MED ORDER — TAMOXIFEN CITRATE 20 MG PO TABS: 20.0000 mg | ORAL_TABLET | Freq: Every day | ORAL | 3 refills | Status: DC

## 2018-06-30 NOTE — Telephone Encounter (Signed)
Gave pt avs and calendar  °

## 2018-06-30 NOTE — Progress Notes (Signed)
Patient Care Team: Thressa Sheller, MD as PCP - General (Internal Medicine) Terrance Mass, MD as Referring Physician (Gynecology) Erroll Luna, MD as Consulting Physician (General Surgery) Nicholas Lose, MD as Consulting Physician (Hematology and Oncology) Kyung Rudd, MD as Consulting Physician (Radiation Oncology)  DIAGNOSIS:  Encounter Diagnosis  Name Primary?  . Malignant neoplasm of upper-inner quadrant of right breast in female, estrogen receptor positive ([place])     SUMMARY OF ONCOLOGIC HISTORY:   Breast cancer of upper-inner quadrant of right female breast ([place])   06/12/2014 Mammogram    Right breast architectural distortion (dense breasts); Ultrasound 1.3 cm mass at 1:00 position additional masses behind this mass measuring 1.3 cm and 0.7 cm. These lumps were palpable    06/13/2014 Initial Diagnosis    Right breast: 1.3cm mass: Invasive mammary cancer probably ductal with mammary ca in situ ER 100% PR 80% gases on 30% HER-2 negative; posterior 1.3 cm mass: IDC ER 100% PR 90% gets some 20% HER-2 negative and 0.7 cm mass: DCIS    07/25/2014 Surgery    Right simple mastectomy: Invasive ductal carcinoma No LVI, Pos for PNI, 3.1 cm grade 3 with DCIS grade 3, ER 100%, PR 100%, HER-2/neu negative Ki-67 20% T2, N0, M0 stage II A., Oncotype DX recurrence over 4, ROR 4%    08/14/2014 -  Anti-estrogen oral therapy    Tamoxifen 20 mg daily     CHIEF COMPLIANT: Follow-up on tamoxifen therapy  INTERVAL HISTORY: Lydia Roberts is a 72 year old with above-mentioned history of right breast cancer treated with mastectomy and is now on tamoxifen and appears to be tolerating it very well.  She completed 4 years of therapy.  Recent mammograms were normal.  REVIEW OF SYSTEMS:   Constitutional: Denies fevers, chills or abnormal weight loss Eyes: Denies blurriness of vision Ears, nose, mouth, throat, and face: Denies mucositis or sore throat Respiratory: Denies cough, dyspnea or  wheezes Cardiovascular: Denies palpitation, chest discomfort Gastrointestinal:  Denies nausea, heartburn or change in bowel habits Skin: Denies abnormal skin rashes Lymphatics: Denies new lymphadenopathy or easy bruising Neurological:Denies numbness, tingling or new weaknesses Behavioral/Psych: Mood is stable, no new changes  Extremities: No lower extremity edema Breast:  denies any pain or lumps or nodules in either breasts All other systems were reviewed with the patient and are negative.  I have reviewed the past medical history, past surgical history, social history and family history with the patient and they are unchanged from previous note.  ALLERGIES:  is allergic to sulfur and codeine.  MEDICATIONS:  Current Outpatient Medications  Medication Sig Dispense Refill  . cholecalciferol (VITAMIN D) 1000 UNITS tablet Take 1,000 Units by mouth daily.    Marland Kitchen ezetimibe (ZETIA) 10 MG tablet Take 10 mg by mouth daily.    Marland Kitchen levothyroxine (SYNTHROID, LEVOTHROID) 125 MCG tablet Take 125 mcg by mouth daily.    . tamoxifen (NOLVADEX) 20 MG tablet TAKE 1 TABLET BY MOUTH  DAILY 90 tablet 0  . tamoxifen (NOLVADEX) 20 MG tablet TAKE 1 TABLET BY MOUTH  DAILY 90 tablet 0   No current facility-administered medications for this visit.     PHYSICAL EXAMINATION: ECOG PERFORMANCE STATUS: 0 - Asymptomatic  Vitals:   06/30/18 0820  BP: 123/72  Pulse: 63  Resp: 17  Temp: 98.3 F (36.8 C)  SpO2: 98%   Filed Weights   06/30/18 0820  Weight: 155 lb 14.4 oz (70.7 kg)    GENERAL:alert, no distress and comfortable SKIN: skin color, texture,  turgor are normal, no rashes or significant lesions EYES: normal, Conjunctiva are pink and non-injected, sclera clear OROPHARYNX:no exudate, no erythema and lips, buccal mucosa, and tongue normal  NECK: supple, thyroid normal size, non-tender, without nodularity LYMPH:  no palpable lymphadenopathy in the cervical, axillary or inguinal LUNGS: clear to  auscultation and percussion with normal breathing effort HEART: regular rate & rhythm and no murmurs and no lower extremity edema ABDOMEN:abdomen soft, non-tender and normal bowel sounds MUSCULOSKELETAL:no cyanosis of digits and no clubbing  NEURO: alert & oriented x 3 with fluent speech, no focal motor/sensory deficits EXTREMITIES: No lower extremity edema BREAST: Right mastectomy no palpable lumps or nodules in the chest wall axilla.  Left breast without any lumps or nodules.  (exam performed in the presence of a chaperone)  LABORATORY DATA:  I have reviewed the data as listed CMP Latest Ref Rng & Units 09/22/2016 07/19/2014 06/19/2014  Glucose 65 - 99 mg/dL 127(H) 121(H) 183(H)  BUN 6 - 20 mg/dL 15 13 12.4  Creatinine 0.44 - 1.00 mg/dL 0.98 0.96 1.1  Sodium 135 - 145 mmol/L 136 142 139  Potassium 3.5 - 5.1 mmol/L 3.7 4.5 4.2  Chloride 101 - 111 mmol/L 104 103 -  CO2 22 - 32 mmol/L '22 28 24  ' Calcium 8.9 - 10.3 mg/dL 8.8(L) 9.8 9.7  Total Protein 6.5 - 8.1 g/dL 6.7 7.5 7.6  Total Bilirubin 0.3 - 1.2 mg/dL 1.6(H) 1.4(H) 1.46(H)  Alkaline Phos 38 - 126 U/L 36(L) 58 69  AST 15 - 41 U/L '21 17 17  ' ALT 14 - 54 U/L '14 12 8    ' Lab Results  Component Value Date   WBC 6.8 09/22/2016   HGB 13.8 09/22/2016   HCT 40.8 09/22/2016   MCV 90.9 09/22/2016   PLT 200 09/22/2016   NEUTROABS 1.9 07/19/2014    ASSESSMENT & PLAN:  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 N0 M0 stage IA: Pathologic stage TII, N0, M0 stage II A., 3.1 cm tumor ER/PR positive HER-2 negative, grade 3, perineural invasion present but no lymphovascular invasion. Oncotype DX recurrence score 4, 4% ROR  Current Treatment: Adjuvant Tamoxifen X 10 years (Osteoporosis) started November 2015  Tamoxifen toxicities: No major side effects of tamoxifen Breast cancer surveillance:  1. Mammogram   at [facility] 06/23/2018:  No evidence of malignancy breast density category D 2. breast exam 06/30/2018 right mastectomy no palpable lumps or nodules, left breast no lumps or nodules.  Osteoporosis: patient cannot take calcium or vitamin D or bisphosphonates. I instructed her to do weightbearing exercises and yoga.  Return to clinic in 1 year for follow-up    No orders of the defined types were placed in this encounter.  The patient has a good understanding of the overall plan. she agrees with it. she will call with any problems that may develop before the next visit here.   Harriette Ohara, MD 06/30/18

## 2018-06-30 NOTE — Assessment & Plan Note (Signed)
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 N0 M0 stage IA: Pathologic stage TII, N0, M0 stage II A., 3.1 cm tumor ER/PR positive HER-2 negative, grade 3, perineural invasion present but no lymphovascular invasion. Oncotype DX recurrence score 4, 4% ROR  Current Treatment: Adjuvant Tamoxifen X 10 years (Osteoporosis) started November 2015  Tamoxifen toxicities: No major side effects of tamoxifen Breast cancer surveillance:  1. Mammogram   at [facility] was normal 2. breast exam 06/30/2018 right mastectomy no palpable lumps or nodules, left breast no lumps or nodules.  Osteoporosis: patient cannot take calcium or vitamin D or bisphosphonates. I instructed her to do weightbearing exercises and yoga.  Return to clinic in 1 year for follow-up

## 2018-10-17 DIAGNOSIS — Z0000 Encounter for general adult medical examination without abnormal findings: Secondary | ICD-10-CM | POA: Diagnosis not present

## 2018-10-17 DIAGNOSIS — E039 Hypothyroidism, unspecified: Secondary | ICD-10-CM | POA: Diagnosis not present

## 2018-10-17 DIAGNOSIS — N390 Urinary tract infection, site not specified: Secondary | ICD-10-CM | POA: Diagnosis not present

## 2018-10-17 DIAGNOSIS — M810 Age-related osteoporosis without current pathological fracture: Secondary | ICD-10-CM | POA: Diagnosis not present

## 2018-10-17 DIAGNOSIS — E785 Hyperlipidemia, unspecified: Secondary | ICD-10-CM | POA: Diagnosis not present

## 2018-10-17 DIAGNOSIS — R739 Hyperglycemia, unspecified: Secondary | ICD-10-CM | POA: Diagnosis not present

## 2018-10-20 DIAGNOSIS — M810 Age-related osteoporosis without current pathological fracture: Secondary | ICD-10-CM | POA: Diagnosis not present

## 2018-10-20 DIAGNOSIS — Z0000 Encounter for general adult medical examination without abnormal findings: Secondary | ICD-10-CM | POA: Diagnosis not present

## 2018-10-23 DIAGNOSIS — M8589 Other specified disorders of bone density and structure, multiple sites: Secondary | ICD-10-CM | POA: Diagnosis not present

## 2019-03-01 DIAGNOSIS — H16223 Keratoconjunctivitis sicca, not specified as Sjogren's, bilateral: Secondary | ICD-10-CM | POA: Diagnosis not present

## 2019-03-01 DIAGNOSIS — H43813 Vitreous degeneration, bilateral: Secondary | ICD-10-CM | POA: Diagnosis not present

## 2019-06-22 DIAGNOSIS — Z23 Encounter for immunization: Secondary | ICD-10-CM | POA: Diagnosis not present

## 2019-06-29 ENCOUNTER — Ambulatory Visit: Payer: Medicare Other | Admitting: Hematology and Oncology

## 2019-07-06 DIAGNOSIS — Z1231 Encounter for screening mammogram for malignant neoplasm of breast: Secondary | ICD-10-CM | POA: Diagnosis not present

## 2019-07-06 DIAGNOSIS — Z853 Personal history of malignant neoplasm of breast: Secondary | ICD-10-CM | POA: Diagnosis not present

## 2019-07-09 ENCOUNTER — Inpatient Hospital Stay: Payer: Medicare Other | Admitting: Hematology and Oncology

## 2019-07-09 NOTE — Assessment & Plan Note (Deleted)
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 N0 M0 stage IA: Pathologic stage TII, N0, M0 stage II A., 3.1 cm tumor ER/PR positive HER-2 negative, grade 3, perineural invasion present but no lymphovascular invasion. Oncotype DX recurrence score 4, 4% ROR  Current Treatment: Adjuvant Tamoxifen X 10 years (Osteoporosis) started November 2015  Tamoxifen toxicities: No major side effects of tamoxifen Breast cancer surveillance: 1.Mammogram  at [facility] 06/23/2018: No evidence of malignancy breast density category D 2.breast exam  07/09/2019 right mastectomy no palpable lumps or nodules, left breast no lumps or nodules.  Osteoporosis: patient cannot take calcium or vitamin D or bisphosphonates. I instructed her to do weightbearing exercises and yoga.  Return to clinic in 1 year for follow-up

## 2019-07-10 ENCOUNTER — Telehealth: Payer: Self-pay | Admitting: Hematology and Oncology

## 2019-07-10 NOTE — Telephone Encounter (Signed)
Called pt per 10/6 sch message - left message for patient to call back to reschedule cancelled appt for yearly follow up

## 2019-07-26 ENCOUNTER — Other Ambulatory Visit: Payer: Self-pay | Admitting: Hematology and Oncology

## 2019-08-09 NOTE — Progress Notes (Signed)
 Patient Care Team: Mackenzie, Brian, MD (Inactive) as PCP - General (Internal Medicine) Fernandez, Juan H, MD (Inactive) as Referring Physician (Gynecology) Cornett, Thomas, MD as Consulting Physician (General Surgery) Gudena, Vinay, MD as Consulting Physician (Hematology and Oncology) Moody, John, MD as Consulting Physician (Radiation Oncology)  DIAGNOSIS:    ICD-10-CM   1. Malignant neoplasm of upper-inner quadrant of right breast in female, estrogen receptor positive ([facility])  C50.211    Z17.0     SUMMARY OF ONCOLOGIC HISTORY: Oncology History  Breast cancer of upper-inner quadrant of right female breast ([facility])  06/12/2014 Mammogram   Right breast architectural distortion (dense breasts); Ultrasound 1.3 cm mass at 1:00 position additional masses behind this mass measuring 1.3 cm and 0.7 cm. These lumps were palpable   06/13/2014 Initial Diagnosis   Right breast: 1.3cm mass: Invasive mammary cancer probably ductal with mammary ca in situ ER 100% PR 80% gases on 30% HER-2 negative; posterior 1.3 cm mass: IDC ER 100% PR 90% gets some 20% HER-2 negative and 0.7 cm mass: DCIS   07/25/2014 Surgery   Right simple mastectomy: Invasive ductal carcinoma No LVI, Pos for PNI, 3.1 cm grade 3 with DCIS grade 3, ER 100%, PR 100%, HER-2/neu negative Ki-67 20% T2, N0, M0 stage II A., Oncotype DX recurrence over 4, ROR 4%   08/14/2014 -  Anti-estrogen oral therapy   Tamoxifen 20 mg daily     CHIEF COMPLIANT: Follow-up of right breast cancer on tamoxifen therapy  INTERVAL HISTORY: Lydia Roberts is a 73 y.o. with above-mentioned history of right breast cancer treated with mastectomy and who is now on anti-estrogen therapy with tamoxifen. I last saw her over a year ago. Mammogram on left breast 07/06/19 showed no evidence of malignancy. She presents to the clinic today for follow-up.   REVIEW OF SYSTEMS:   Constitutional: Denies fevers, chills or abnormal weight loss Eyes: Denies blurriness of vision  Ears, nose, mouth, throat, and face: Denies mucositis or sore throat Respiratory: Denies cough, dyspnea or wheezes Cardiovascular: Denies palpitation, chest discomfort Gastrointestinal: Denies nausea, heartburn or change in bowel habits Skin: Denies abnormal skin rashes Lymphatics: Denies new lymphadenopathy or easy bruising Neurological: Denies numbness, tingling or new weaknesses Behavioral/Psych: Mood is stable, no new changes  Extremities: No lower extremity edema Breast: denies any pain or lumps or nodules in either breasts All other systems were reviewed with the patient and are negative.  I have reviewed the past medical history, past surgical history, social history and family history with the patient and they are unchanged from previous note.  ALLERGIES:  is allergic to sulfur and codeine.  MEDICATIONS:  Current Outpatient Medications  Medication Sig Dispense Refill  . cholecalciferol (VITAMIN D) 1000 UNITS tablet Take 1,000 Units by mouth daily.    . ezetimibe (ZETIA) 10 MG tablet Take 10 mg by mouth daily.    . levothyroxine (SYNTHROID, LEVOTHROID) 125 MCG tablet Take 125 mcg by mouth daily.    . tamoxifen (NOLVADEX) 20 MG tablet TAKE 1 TABLET BY MOUTH  DAILY 90 tablet 3   No current facility-administered medications for this visit.     PHYSICAL EXAMINATION: ECOG PERFORMANCE STATUS: 1 - Symptomatic but completely ambulatory  Vitals:   08/10/19 1149  BP: 123/76  Pulse: 74  Resp: 16  Temp: 98.3 F (36.8 C)  SpO2: 100%   Filed Weights   08/10/19 1149  Weight: 143 lb (64.9 kg)   GENERAL: alert, no distress and comfortable SKIN: skin color, texture, turgor   are normal, no rashes or significant lesions EYES: normal, Conjunctiva are pink and non-injected, sclera clear OROPHARYNX: no exudate, no erythema and lips, buccal mucosa, and tongue normal  NECK: supple, thyroid normal size, non-tender, without nodularity LYMPH: no palpable lymphadenopathy in the cervical,  axillary or inguinal LUNGS: clear to auscultation and percussion with normal breathing effort HEART: regular rate & rhythm and no murmurs and no lower extremity edema ABDOMEN: abdomen soft, non-tender and normal bowel sounds MUSCULOSKELETAL: no cyanos of digits and no clubbing  NEURO: alert & oriented x 3 with fluent speech, no focal motor/sensory deficits EXTREMITIES: No lower extremity edema BREAST: Right mastectomy scar is without any nodules.  Left breast no palpable lumps or nodules. No palpable axillary supraclavicular or infraclavicular adenopathy no breast tenderness or nipple discharge. (exam performed in the presence of a chaperone)  LABORATORY DATA:  I have reviewed the data as listed CMP Latest Ref Rng & Units 09/22/2016 07/19/2014 06/19/2014  Glucose 65 - 99 mg/dL 127(H) 121(H) 183(H)  BUN 6 - 20 mg/dL 15 13 12.4  Creatinine 0.44 - 1.00 mg/dL 0.98 0.96 1.1  Sodium 135 - 145 mmol/L 136 142 139  Potassium 3.5 - 5.1 mmol/L 3.7 4.5 4.2  Chloride 101 - 111 mmol/L 104 103 -  CO2 22 - 32 mmol/L _0 Calcium 8.9 - 10.3 mg/dL 8.8(L) 9.8 9.7  Total Protein 6.5 - 8.1 g/dL 6.7 7.5 7.6  Total Bilirubin 0.3 - 1.2 mg/dL 1.6(H) 1.4(H) 1.46(H)  Alkaline Phos 38 - 126 U/L 36(L) 58 69  AST 15 - 41 U/L _1 ALT 14 - 54 U/L _2 Lab Results  Component Value Date   WBC 6.8 09/22/2016   HGB 13.8 09/22/2016   HCT 40.8 09/22/2016   MCV 90.9 09/22/2016   PLT 200 09/22/2016   NEUTROABS 1.9 07/19/2014    ASSESSMENT & PLAN:  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 N0 M0 stage IA: Pathologic stage TII, N0, M0 stage II A., 3.1 cm tumor ER/PR positive HER-2 negative, grade 3, perineural invasion present but no lymphovascular invasion. Oncotype DX recurrence score 4, 4% ROR  Current Treatment: Adjuvant Tamoxifen X 10 years (Osteoporosis) started November 2015   Tamoxifen toxicities: No major side effects of tamoxifen Breast cancer surveillance: 1.Mammogram left breast at [facility] 07/06/2019: No evidence of malignancy breast density category D we discussed the issue of breast density and the role of breast MRIs.  We decided that she does not need breast MRIs. 2.breast exam  08/10/2019 right mastectomy no palpable lumps or nodules, left breast no lumps or nodules.  Osteoporosis: patient cannot take calcium or vitamin D or bisphosphonates. I instructed her to do weightbearing exercises and yoga.  Return to clinic in 1 year for follow-up    No orders of the defined types were placed in this encounter.  The patient has a good understanding of the overall plan. she agrees with it. she will call with any problems that may develop before the next visit here.  Nicholas Lose, MD 08/10/2019  Julious Oka Dorshimer am acting as scribe for Dr. Nicholas Lose.  I have reviewed the above documentation for accuracy and completeness, and I agree with the above.

## 2019-08-10 ENCOUNTER — Other Ambulatory Visit: Payer: Self-pay

## 2019-08-10 ENCOUNTER — Inpatient Hospital Stay: Payer: Medicare Other | Attending: Hematology and Oncology | Admitting: Hematology and Oncology

## 2019-08-10 ENCOUNTER — Telehealth: Payer: Self-pay | Admitting: Hematology and Oncology

## 2019-08-10 DIAGNOSIS — C50211 Malignant neoplasm of upper-inner quadrant of right female breast: Secondary | ICD-10-CM

## 2019-08-10 DIAGNOSIS — Z79810 Long term (current) use of selective estrogen receptor modulators (SERMs): Secondary | ICD-10-CM | POA: Diagnosis not present

## 2019-08-10 DIAGNOSIS — Z79899 Other long term (current) drug therapy: Secondary | ICD-10-CM | POA: Insufficient documentation

## 2019-08-10 DIAGNOSIS — Z9011 Acquired absence of right breast and nipple: Secondary | ICD-10-CM | POA: Insufficient documentation

## 2019-08-10 DIAGNOSIS — M810 Age-related osteoporosis without current pathological fracture: Secondary | ICD-10-CM | POA: Insufficient documentation

## 2019-08-10 DIAGNOSIS — Z170 Estrogen receptor positive status [ER+]: Secondary | ICD-10-CM | POA: Insufficient documentation

## 2019-08-10 MED ORDER — TAMOXIFEN CITRATE 20 MG PO TABS: 20.0000 mg | ORAL_TABLET | Freq: Every day | ORAL | 3 refills | Status: DC

## 2019-08-10 NOTE — Assessment & Plan Note (Signed)
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 N0 M0 stage IA: Pathologic stage TII, N0, M0 stage II A., 3.1 cm tumor ER/PR positive HER-2 negative, grade 3, perineural invasion present but no lymphovascular invasion. Oncotype DX recurrence score 4, 4% ROR  Current Treatment: Adjuvant Tamoxifen X 10 years (Osteoporosis) started November 2015  Tamoxifen toxicities: No major side effects of tamoxifen Breast cancer surveillance: 1.Mammogram  at [facility] 07/06/2019: No evidence of malignancy breast density category D 2.breast exam  08/10/2019 right mastectomy no palpable lumps or nodules, left breast no lumps or nodules.  Osteoporosis: patient cannot take calcium or vitamin D or bisphosphonates. I instructed her to do weightbearing exercises and yoga.  Return to clinic in 1 year for follow-up

## 2019-08-10 NOTE — Telephone Encounter (Signed)
I talk with patient regarding schedule  

## 2019-10-15 DIAGNOSIS — E039 Hypothyroidism, unspecified: Secondary | ICD-10-CM | POA: Diagnosis not present

## 2019-10-15 DIAGNOSIS — R300 Dysuria: Secondary | ICD-10-CM | POA: Diagnosis not present

## 2019-10-15 DIAGNOSIS — E785 Hyperlipidemia, unspecified: Secondary | ICD-10-CM | POA: Diagnosis not present

## 2019-10-15 DIAGNOSIS — Z0000 Encounter for general adult medical examination without abnormal findings: Secondary | ICD-10-CM | POA: Diagnosis not present

## 2019-10-22 DIAGNOSIS — E785 Hyperlipidemia, unspecified: Secondary | ICD-10-CM | POA: Diagnosis not present

## 2019-10-22 DIAGNOSIS — E039 Hypothyroidism, unspecified: Secondary | ICD-10-CM | POA: Diagnosis not present

## 2019-10-22 DIAGNOSIS — Z853 Personal history of malignant neoplasm of breast: Secondary | ICD-10-CM | POA: Diagnosis not present

## 2019-10-22 DIAGNOSIS — E559 Vitamin D deficiency, unspecified: Secondary | ICD-10-CM | POA: Diagnosis not present

## 2019-10-22 DIAGNOSIS — Z0000 Encounter for general adult medical examination without abnormal findings: Secondary | ICD-10-CM | POA: Diagnosis not present

## 2019-12-03 ENCOUNTER — Ambulatory Visit: Payer: Medicare Other | Attending: Internal Medicine

## 2019-12-03 DIAGNOSIS — Z23 Encounter for immunization: Secondary | ICD-10-CM | POA: Insufficient documentation

## 2019-12-03 NOTE — Progress Notes (Signed)
   Covid-19 Vaccination Clinic  Name:  CRISTAN TASCH    MRN: [id number] DOB: Jan 11, 1946  12/03/2019  Ms. Harvill was observed post Covid-19 immunization for 15 minutes without incidence. She was provided with Vaccine Information Sheet and instruction to access the V-Safe system.   Ms. Hlavacek was instructed to call 911 with any severe reactions post vaccine: Marland Kitchen Difficulty breathing  . Swelling of your face and throat  . A fast heartbeat  . A bad rash all over your body  . Dizziness and weakness    Immunizations Administered    Name Date Dose VIS Date Route   Pfizer COVID-19 Vaccine 12/03/2019  9:42 AM 0.3 mL 09/14/2019 Intramuscular   Manufacturer: [place]   Lot: [id number]   [place]: [id number]

## 2020-01-01 ENCOUNTER — Ambulatory Visit: Payer: Medicare Other | Attending: Internal Medicine

## 2020-01-01 DIAGNOSIS — Z23 Encounter for immunization: Secondary | ICD-10-CM

## 2020-01-01 NOTE — Progress Notes (Signed)
   Covid-19 Vaccination Clinic  Name:  Lydia Roberts    MRN: [id number] DOB: 08-10-1946  01/01/2020  [name] was observed post Covid-19 immunization for 15 minutes without incident. She was provided with Vaccine Information Sheet and instruction to access the V-Safe system.   [name] was instructed to call 911 with any severe reactions post vaccine: Marland Kitchen Difficulty breathing  . Swelling of face and throat  . A fast heartbeat  . A bad rash all over body  . Dizziness and weakness   Immunizations Administered    Name Date Dose VIS Date Route   Pfizer COVID-19 Vaccine 01/01/2020  9:33 AM 0.3 mL 09/14/2019 Intramuscular   Manufacturer: [place]   Lot: [id number]   [place]: [id number]

## 2020-01-22 DIAGNOSIS — E039 Hypothyroidism, unspecified: Secondary | ICD-10-CM | POA: Diagnosis not present

## 2020-01-22 DIAGNOSIS — E559 Vitamin D deficiency, unspecified: Secondary | ICD-10-CM | POA: Diagnosis not present

## 2020-01-29 DIAGNOSIS — Z23 Encounter for immunization: Secondary | ICD-10-CM | POA: Diagnosis not present

## 2020-01-29 DIAGNOSIS — R252 Cramp and spasm: Secondary | ICD-10-CM | POA: Diagnosis not present

## 2020-01-29 DIAGNOSIS — E039 Hypothyroidism, unspecified: Secondary | ICD-10-CM | POA: Diagnosis not present

## 2020-02-06 DIAGNOSIS — H43813 Vitreous degeneration, bilateral: Secondary | ICD-10-CM | POA: Diagnosis not present

## 2020-04-22 DIAGNOSIS — E039 Hypothyroidism, unspecified: Secondary | ICD-10-CM | POA: Diagnosis not present

## 2020-04-29 DIAGNOSIS — E039 Hypothyroidism, unspecified: Secondary | ICD-10-CM | POA: Diagnosis not present

## 2020-04-29 DIAGNOSIS — E785 Hyperlipidemia, unspecified: Secondary | ICD-10-CM | POA: Diagnosis not present

## 2020-04-29 DIAGNOSIS — E559 Vitamin D deficiency, unspecified: Secondary | ICD-10-CM | POA: Diagnosis not present

## 2020-04-29 DIAGNOSIS — R7309 Other abnormal glucose: Secondary | ICD-10-CM | POA: Diagnosis not present

## 2020-06-28 ENCOUNTER — Ambulatory Visit
Admission: EM | Admit: 2020-06-28 | Discharge: 2020-06-28 | Disposition: A | Discharge status: Home / Self Care | Payer: Medicare Other | Attending: Family Medicine | Admitting: Family Medicine

## 2020-06-28 ENCOUNTER — Encounter: Payer: Self-pay | Admitting: Emergency Medicine

## 2020-06-28 ENCOUNTER — Other Ambulatory Visit: Payer: Self-pay

## 2020-06-28 DIAGNOSIS — N3001 Acute cystitis with hematuria: Secondary | ICD-10-CM

## 2020-06-28 DIAGNOSIS — R319 Hematuria, unspecified: Secondary | ICD-10-CM

## 2020-06-28 DIAGNOSIS — H2000 Unspecified acute and subacute iridocyclitis: Secondary | ICD-10-CM

## 2020-06-28 LAB — POCT URINALYSIS DIP (MANUAL ENTRY)
Bilirubin, UA: NEGATIVE
Glucose, UA: NEGATIVE mg/dL
Ketones, POC UA: NEGATIVE mg/dL
Leukocytes, UA: NEGATIVE
Nitrite, UA: NEGATIVE
Protein Ur, POC: NEGATIVE mg/dL
Spec Grav, UA: <=1.005 — AB (ref 1.010–1.025)
Urobilinogen, UA: 0.2 E.U./dL
pH, UA: 6 (ref 5.0–8.0)

## 2020-06-28 MED ORDER — DOXYCYCLINE HYCLATE 100 MG PO CAPS: 100.0000 mg | ORAL_CAPSULE | Freq: Two times a day (BID) | ORAL | 0 refills | Status: AC

## 2020-06-28 NOTE — ED Triage Notes (Signed)
Pt here for possible UTI sx after she noticed some blood when wiping

## 2020-06-28 NOTE — ED Provider Notes (Signed)
[facility]    CSN: [id number] Arrival date & time: 06/28/20  1012      History   Chief Complaint Chief Complaint  Patient presents with   Urinary Tract Infection    HPI Lydia Roberts is a 74 y.o. female.   HPI  patient presents today for evaluation of a urinary tract infection after she noticed some lower right abdominal pain 2 days ago which is resolved and mild blood coming from her vaginal area on yesterday.  She has no other symptoms although has a history of recurrent UTIs and urosepsis.  Past Medical History:  Diagnosis Date   Anxiety    Arthritis    Breast cancer ([place]) 1993   Cancer ([place])    Osteoporosis    Recurrent UTI    Thyroid disease     Patient Active Problem List   Diagnosis Date Noted   Pain in left shin 11/08/2016   Pain in left foot 11/08/2016   Breast cancer of upper-inner quadrant of right female breast ([place]) 06/17/2014   History of breast cancer 01/17/2014   Osteoporosis 01/17/2014    Past Surgical History:  Procedure Laterality Date   BREAST SURGERY  1993   rt lump-axillary node dissection   COLONOSCOPY     SIMPLE MASTECTOMY WITH AXILLARY SENTINEL NODE BIOPSY Right 07/25/2014   Procedure: RIGHT SIMPLE MASTECTOMY;  Surgeon: Erroll Luna, MD;  Location: [place];  Service: General;  Laterality: Right;   TONSILLECTOMY     TUBAL LIGATION      OB History    Gravida  3   Para  3   Term      Preterm      AB      Living  3     SAB      TAB      Ectopic      Multiple      Live Births               Home Medications    Prior to Admission medications   Medication Sig Start Date End Date Taking? Authorizing Provider  cholecalciferol (VITAMIN D) 1000 UNITS tablet Take 1,000 Units by mouth daily.    [provider]  ezetimibe (ZETIA) 10 MG tablet Take 10 mg by mouth daily.    [provider]  levothyroxine (SYNTHROID, LEVOTHROID) 125 MCG tablet Take  125 mcg by mouth daily.    [provider]  tamoxifen (NOLVADEX) 20 MG tablet Take 1 tablet (20 mg total) by mouth daily. 08/10/19   Nicholas Lose, MD    Family History Family History  Problem Relation Age of Onset   Diabetes Sister    Diabetes Brother    Diabetes Brother    Thyroid disease Daughter    Kidney cancer Father 41       deceased 55   Breast cancer Paternal Aunt        Dx 49s; died at 36; Had 7 sisters who are cancer-free   Breast cancer Cousin     Social History Social History   Tobacco Use   Smoking status: Never Smoker   Smokeless tobacco: Never Used  Substance Use Topics   Alcohol use: No    Alcohol/week: 0.0 standard drinks   Drug use: No     Allergies   Sulfur and Codeine eview of Systems Review of Systems Pertinent negatives listed in HPI Physical Exam Triage Vital Signs ED Triage Vitals  Enc Vitals Group  BP 06/28/20 1212 (!) 159/83     Pulse Rate 06/28/20 1212 85     Resp 06/28/20 1212 18     Temp 06/28/20 1212 98 F (36.7 C)     Temp Source 06/28/20 1212 Oral     SpO2 06/28/20 1212 98 %     Weight --      Height --      Head Circumference --      Peak Flow --      Pain Score 06/28/20 1213 0     Pain Loc --      Pain Edu? --      Excl. in [place]? --    No data found.  Updated Vital Signs BP (!) 159/83 (BP Location: Right Arm)    Pulse 85    Temp 98 F (36.7 C) (Oral)    Resp 18    SpO2 98%   Visual Acuity Right Eye Distance:   Left Eye Distance:   Bilateral Distance:    Right Eye Near:   Left Eye Near:    Bilateral Near:     Physical Exam General appearance: alert, well developed, well nourished, cooperative and in no distress Respiratory: Respirations even and unlabored, normal respiratory rate Heart: rate and rhythm normal. No gallop or murmurs noted on exam  Abdomen: BS +, no distention, no rebound tenderness, or no mass, no CVA tenderness  Skin: Skin color, texture, turgor normal. No rashes seen    Psych: Appropriate mood and affect.  UC Treatments / Results  Labs (all labs ordered are listed, but only abnormal results are displayed) Labs Reviewed  POCT URINALYSIS DIP (MANUAL ENTRY) - Abnormal; Notable for the following components:      Result Value   Spec Grav, UA <=1.005 (*)    Blood, UA trace-lysed (*)    All other components within normal limits  URINE CULTURE    EKG   Radiology No results found.  Procedures Procedures (including critical care time)  Medications Ordered in UC Medications - No data to display  Initial Impression / Assessment and Plan / UC Course  I have reviewed the triage vital signs and the nursing notes.  Pertinent labs & imaging results that were available during my care of the patient were reviewed by me and considered in my medical decision making (see chart for details).   UA unremarkable, however given hematuria, history of atypica UTI and urosepsis, will treat with broad-spectrum antibiotics, doxycyline. Urine culture pending. Encourages hydrate. Red flags discussed.   Final Clinical Impressions(s) / UC Diagnoses   Final diagnoses:  Hematuria, unspecified type  Acute cystitis with hematuria     Discharge Instructions     Treating you for an acute urinary tract infection.     ED Prescriptions    Medication Sig Dispense Auth. Provider   doxycycline (VIBRAMYCIN) 100 MG capsule Take 1 capsule (100 mg total) by mouth 2 (two) times daily for 5 days. 10 capsule Scot Jun, FNP     PDMP not reviewed this encounter.   Scot Jun, FNP 07/02/20 [id number]

## 2020-06-28 NOTE — Discharge Instructions (Addendum)
Treating you for an acute urinary tract infection.

## 2020-07-01 LAB — URINE CULTURE
Culture: <10000 — AB
Special Requests: NORMAL

## 2020-07-03 ENCOUNTER — Other Ambulatory Visit: Payer: Self-pay | Admitting: Hematology and Oncology

## 2020-07-07 DIAGNOSIS — Z1231 Encounter for screening mammogram for malignant neoplasm of breast: Secondary | ICD-10-CM | POA: Diagnosis not present

## 2020-07-18 DIAGNOSIS — Z23 Encounter for immunization: Secondary | ICD-10-CM | POA: Diagnosis not present

## 2020-07-19 ENCOUNTER — Ambulatory Visit: Payer: Medicare Other

## 2020-08-02 ENCOUNTER — Ambulatory Visit: Payer: Medicare Other

## 2020-08-11 ENCOUNTER — Telehealth: Payer: Self-pay | Admitting: Hematology and Oncology

## 2020-08-11 ENCOUNTER — Other Ambulatory Visit: Payer: Self-pay

## 2020-08-11 ENCOUNTER — Inpatient Hospital Stay: Payer: Medicare Other | Attending: Hematology and Oncology | Admitting: Hematology and Oncology

## 2020-08-11 DIAGNOSIS — Z79810 Long term (current) use of selective estrogen receptor modulators (SERMs): Secondary | ICD-10-CM | POA: Diagnosis not present

## 2020-08-11 DIAGNOSIS — M810 Age-related osteoporosis without current pathological fracture: Secondary | ICD-10-CM | POA: Insufficient documentation

## 2020-08-11 DIAGNOSIS — Z170 Estrogen receptor positive status [ER+]: Secondary | ICD-10-CM | POA: Insufficient documentation

## 2020-08-11 DIAGNOSIS — Z9011 Acquired absence of right breast and nipple: Secondary | ICD-10-CM | POA: Diagnosis not present

## 2020-08-11 DIAGNOSIS — C50211 Malignant neoplasm of upper-inner quadrant of right female breast: Secondary | ICD-10-CM | POA: Diagnosis not present

## 2020-08-11 DIAGNOSIS — Z79899 Other long term (current) drug therapy: Secondary | ICD-10-CM | POA: Diagnosis not present

## 2020-08-11 MED ORDER — TAMOXIFEN CITRATE 20 MG PO TABS
20.0000 mg | ORAL_TABLET | Freq: Every day | ORAL | 3 refills | Status: DC
Start: 2020-08-11 — End: 2020-10-16

## 2020-08-11 NOTE — Telephone Encounter (Signed)
Scheduled appointment per 11/8 los. Spoke to patient who is aware of appointment date and time. Gave patient calendar print out.

## 2020-08-11 NOTE — Assessment & Plan Note (Signed)
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 N0 M0 stage IA: Pathologic stage TII, N0, M0 stage II A., 3.1 cm tumor ER/PR positive HER-2 negative, grade 3, perineural invasion present but no lymphovascular invasion. Oncotype DX recurrence score 4, 4% ROR  Current Treatment: Adjuvant Tamoxifen X 10 years (Osteoporosis) started November 2015  Tamoxifen toxicities: No major side effects of tamoxifen  Breast cancer surveillance: 1.Mammogram left breastat [facility] 07/07/2020: Benign breast density category C. 2.breast exam  08/11/2020 right mastectomy no palpable lumps or nodules, left breast no lumps or nodules.  Osteoporosis: patient cannot take calcium or vitamin D or bisphosphonates. I instructed her to do weightbearing exercises and yoga.  Return to clinic in 1 year for follow-up

## 2020-08-11 NOTE — Progress Notes (Signed)
Patient Care Team: Janie Morning, DO as PCP - General (Family Medicine) Terrance Mass, MD (Inactive) as Referring Physician (Gynecology) Erroll Luna, MD as Consulting Physician (General Surgery) Nicholas Lose, MD as Consulting Physician (Hematology and Oncology) Kyung Rudd, MD as Consulting Physician (Radiation Oncology)  DIAGNOSIS:    ICD-10-CM   1. Malignant neoplasm of upper-inner quadrant of right breast in female, estrogen receptor positive ([place])  C50.211    Z17.0     SUMMARY OF ONCOLOGIC HISTORY: Oncology History  Breast cancer of upper-inner quadrant of right female breast ([place])  06/12/2014 Mammogram   Right breast architectural distortion (dense breasts); Ultrasound 1.3 cm mass at 1:00 position additional masses behind this mass measuring 1.3 cm and 0.7 cm. These lumps were palpable   06/13/2014 Initial Diagnosis   Right breast: 1.3cm mass: Invasive mammary cancer probably ductal with mammary ca in situ ER 100% PR 80% gases on 30% HER-2 negative; posterior 1.3 cm mass: IDC ER 100% PR 90% gets some 20% HER-2 negative and 0.7 cm mass: DCIS   07/25/2014 Surgery   Right simple mastectomy: Invasive ductal carcinoma No LVI, Pos for PNI, 3.1 cm grade 3 with DCIS grade 3, ER 100%, PR 100%, HER-2/neu negative Ki-67 20% T2, N0, M0 stage II A., Oncotype DX recurrence over 4, ROR 4%   08/14/2014 -  Anti-estrogen oral therapy   Tamoxifen 20 mg daily     CHIEF COMPLIANT: Follow-up of right breast cancer on tamoxifen therapy  INTERVAL HISTORY: Lydia Roberts is a 74 y.o. with above-mentioned history of right breast cancer treated with mastectomy and who is now on anti-estrogen therapy with tamoxifen. She presents to the clinic today for follow-up.   ALLERGIES:  is allergic to sulfur and codeine.  MEDICATIONS:  Current Outpatient Medications  Medication Sig Dispense Refill  . cholecalciferol (VITAMIN D) 1000 UNITS tablet Take 1,000 Units by mouth daily.    Marland Kitchen ezetimibe  (ZETIA) 10 MG tablet Take 10 mg by mouth daily.    Marland Kitchen levothyroxine (SYNTHROID, LEVOTHROID) 125 MCG tablet Take 125 mcg by mouth daily.    . tamoxifen (NOLVADEX) 20 MG tablet TAKE 1 TABLET BY MOUTH  DAILY 90 tablet 0   No current facility-administered medications for this visit.    PHYSICAL EXAMINATION: ECOG PERFORMANCE STATUS: 1 - Symptomatic but completely ambulatory  Vitals:   08/11/20 0944  BP: 130/64  Pulse: 61  Resp: 18  Temp: (!) 96.3 F (35.7 C)  SpO2: 99%   Filed Weights   08/11/20 0944  Weight: 133 lb 9.6 oz (60.6 kg)    BREAST: Right mastectomy chest wall small area of skin erythema with slight nodularity underneath Left breast no palpable lumps or nodules (exam performed in the presence of a chaperone)     LABORATORY DATA:  I have reviewed the data as listed CMP Latest Ref Rng & Units 09/22/2016 07/19/2014 06/19/2014  Glucose 65 - 99 mg/dL 127(H) 121(H) 183(H)  BUN 6 - 20 mg/dL 15 13 12.4  Creatinine 0.44 - 1.00 mg/dL 0.98 0.96 1.1  Sodium 135 - 145 mmol/L 136 142 139  Potassium 3.5 - 5.1 mmol/L 3.7 4.5 4.2  Chloride 101 - 111 mmol/L 104 103 -  CO2 22 - 32 mmol/L _0 Calcium 8.9 - 10.3 mg/dL 8.8(L) 9.8 9.7  Total Protein 6.5 - 8.1 g/dL 6.7 7.5 7.6  Total Bilirubin 0.3 - 1.2 mg/dL 1.6(H) 1.4(H) 1.46(H)  Alkaline Phos 38 - 126 U/L 36(L) 58 69  AST 15 -  41 U/L _0 ALT 14 - 54 U/L _1 Lab Results  Component Value Date   WBC 6.8 09/22/2016   HGB 13.8 09/22/2016   HCT 40.8 09/22/2016   MCV 90.9 09/22/2016   PLT 200 09/22/2016   NEUTROABS 1.9 07/19/2014    ASSESSMENT & PLAN:  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 N0 M0 stage IA: Pathologic stage TII, N0, M0 stage II A., 3.1 cm tumor ER/PR positive HER-2 negative, grade 3, perineural invasion present but no lymphovascular invasion. Oncotype DX recurrence score 4, 4%  ROR  Current Treatment: Adjuvant Tamoxifen X 10 years (Osteoporosis) started November 2015  Tamoxifen toxicities: No major side effects of tamoxifen  Breast cancer surveillance: 1.Mammogram left breastat [facility] 07/07/2020: Benign breast density category C. 2.breast exam  08/11/2020 right mastectomy no palpable lumps or nodules, left breast no lumps or nodules.  Right chest wall postmastectomy skin erythema with slight palpable nodularity: Could be eczema versus recurrent malignancy.  I requested Dr. Brantley Stage to see the patient and consider biopsying in his office. If that is benign then we can continue to watch and monitor this once a year.  Osteoporosis: patient cannot take calcium or vitamin D or bisphosphonates. I instructed her to do weightbearing exercises and yoga.  Return to clinic in 1 year for follow-up   No orders of the defined types were placed in this encounter.  The patient has a good understanding of the overall plan. she agrees with it. she will call with any problems that may develop before the next visit here.  Total time spent: 30 mins including face to face time and time spent for planning, charting and coordination of care  Nicholas Lose, MD 08/11/2020  I, Cloyde Reams Dorshimer, am acting as scribe for Dr. Nicholas Lose.  I have reviewed the above documentation for accuracy and completeness, and I agree with the above.

## 2020-08-15 ENCOUNTER — Other Ambulatory Visit: Payer: Self-pay | Admitting: Surgery

## 2020-08-15 DIAGNOSIS — C44591 Other specified malignant neoplasm of skin of breast: Secondary | ICD-10-CM | POA: Diagnosis not present

## 2020-08-15 DIAGNOSIS — Z853 Personal history of malignant neoplasm of breast: Secondary | ICD-10-CM | POA: Diagnosis not present

## 2020-08-15 DIAGNOSIS — C792 Secondary malignant neoplasm of skin: Secondary | ICD-10-CM | POA: Diagnosis not present

## 2020-08-21 ENCOUNTER — Other Ambulatory Visit: Payer: Self-pay | Admitting: *Deleted

## 2020-08-21 DIAGNOSIS — C50211 Malignant neoplasm of upper-inner quadrant of right female breast: Secondary | ICD-10-CM

## 2020-08-21 DIAGNOSIS — Z170 Estrogen receptor positive status [ER+]: Secondary | ICD-10-CM

## 2020-08-21 MED ORDER — LORAZEPAM 1 MG PO TABS: 1.0000 mg | ORAL_TABLET | Freq: Two times a day (BID) | ORAL | 0 refills | Status: DC

## 2020-08-21 NOTE — Progress Notes (Signed)
Apts scheduled for CT and bone scan and verified with pt.  Pt states she has hx of claustrophobia and requesting something to help ease her anxiety for CT scan.  Per MD pt to take Ativan 1 mg tablet one hour prior to CT scan and second tablet 30 minutes prior to scan.  Pt eructated on administration and importance of having someone drive her to her apt.  Pt verbalized understanding and states her daughter will be driving her.

## 2020-08-21 NOTE — Progress Notes (Signed)
Per MD request due to recent punch biopsy showing breast cancer in right mastectomy flap orders placed for CT chest, abdomen, pelvis, and whole body bone scan to rule out metastatic disease.

## 2020-09-01 ENCOUNTER — Other Ambulatory Visit: Payer: Self-pay

## 2020-09-01 DIAGNOSIS — Z170 Estrogen receptor positive status [ER+]: Secondary | ICD-10-CM

## 2020-09-02 ENCOUNTER — Other Ambulatory Visit: Payer: Self-pay

## 2020-09-02 ENCOUNTER — Inpatient Hospital Stay: Payer: Medicare Other

## 2020-09-02 DIAGNOSIS — Z170 Estrogen receptor positive status [ER+]: Secondary | ICD-10-CM | POA: Diagnosis not present

## 2020-09-02 DIAGNOSIS — M810 Age-related osteoporosis without current pathological fracture: Secondary | ICD-10-CM | POA: Diagnosis not present

## 2020-09-02 DIAGNOSIS — Z79899 Other long term (current) drug therapy: Secondary | ICD-10-CM | POA: Diagnosis not present

## 2020-09-02 DIAGNOSIS — Z9011 Acquired absence of right breast and nipple: Secondary | ICD-10-CM | POA: Diagnosis not present

## 2020-09-02 DIAGNOSIS — C50211 Malignant neoplasm of upper-inner quadrant of right female breast: Secondary | ICD-10-CM | POA: Diagnosis not present

## 2020-09-02 LAB — CBC WITH DIFFERENTIAL (CANCER CENTER ONLY)
Abs Immature Granulocytes: 0.02 10*3/uL (ref 0.00–0.07)
Basophils Absolute: 0.1 10*3/uL (ref 0.0–0.1)
Basophils Relative: 1 %
Eosinophils Absolute: 0.1 10*3/uL (ref 0.0–0.5)
Eosinophils Relative: 2 %
HCT: 38.2 % (ref 36.0–46.0)
Hemoglobin: 12.4 g/dL (ref 12.0–15.0)
Immature Granulocytes: 0 %
Lymphocytes Relative: 47 %
Lymphs Abs: 2.8 10*3/uL (ref 0.7–4.0)
MCH: 30.3 pg (ref 26.0–34.0)
MCHC: 32.5 g/dL (ref 30.0–36.0)
MCV: 93.4 fL (ref 80.0–100.0)
Monocytes Absolute: 0.6 10*3/uL (ref 0.1–1.0)
Monocytes Relative: 9 %
Neutro Abs: 2.4 10*3/uL (ref 1.7–7.7)
Neutrophils Relative %: 41 %
Platelet Count: 173 10*3/uL (ref 150–400)
RBC: 4.09 MIL/uL (ref 3.87–5.11)
RDW: 13.4 % (ref 11.5–15.5)
WBC Count: 6 10*3/uL (ref 4.0–10.5)
nRBC: 0 % (ref 0.0–0.2)

## 2020-09-02 LAB — CMP (CANCER CENTER ONLY)
ALT: 11 U/L (ref 0–44)
AST: 20 U/L (ref 15–41)
Albumin: 3.9 g/dL (ref 3.5–5.0)
Alkaline Phosphatase: 42 U/L (ref 38–126)
Anion gap: 8 (ref 5–15)
BUN: 18 mg/dL (ref 8–23)
CO2: 26 mmol/L (ref 22–32)
Calcium: 9.2 mg/dL (ref 8.9–10.3)
Chloride: 107 mmol/L (ref 98–111)
Creatinine: 1.1 mg/dL — ABNORMAL HIGH (ref 0.44–1.00)
GFR, Estimated: 53 mL/min — ABNORMAL LOW (ref >60)
Glucose, Bld: 96 mg/dL (ref 70–99)
Potassium: 4.2 mmol/L (ref 3.5–5.1)
Sodium: 141 mmol/L (ref 135–145)
Total Bilirubin: 1 mg/dL (ref 0.3–1.2)
Total Protein: 7.2 g/dL (ref 6.5–8.1)

## 2020-09-03 ENCOUNTER — Encounter (HOSPITAL_COMMUNITY): Payer: Self-pay

## 2020-09-03 ENCOUNTER — Encounter (HOSPITAL_COMMUNITY)
Admission: RE | Admit: 2020-09-03 | Discharge: 2020-09-03 | Disposition: A | Discharge status: Home / Self Care | Payer: Medicare Other | Source: Ambulatory Visit | Attending: Hematology and Oncology | Admitting: Hematology and Oncology

## 2020-09-03 ENCOUNTER — Ambulatory Visit (HOSPITAL_COMMUNITY)
Admission: RE | Admit: 2020-09-03 | Discharge: 2020-09-03 | Disposition: A | Discharge status: Home / Self Care | Payer: Medicare Other | Source: Ambulatory Visit | Attending: Hematology and Oncology | Admitting: Hematology and Oncology

## 2020-09-03 DIAGNOSIS — Z170 Estrogen receptor positive status [ER+]: Secondary | ICD-10-CM

## 2020-09-03 DIAGNOSIS — C50211 Malignant neoplasm of upper-inner quadrant of right female breast: Secondary | ICD-10-CM | POA: Insufficient documentation

## 2020-09-03 DIAGNOSIS — J8410 Pulmonary fibrosis, unspecified: Secondary | ICD-10-CM | POA: Diagnosis not present

## 2020-09-03 DIAGNOSIS — K7689 Other specified diseases of liver: Secondary | ICD-10-CM | POA: Diagnosis not present

## 2020-09-03 DIAGNOSIS — C50919 Malignant neoplasm of unspecified site of unspecified female breast: Secondary | ICD-10-CM | POA: Diagnosis not present

## 2020-09-03 DIAGNOSIS — K5730 Diverticulosis of large intestine without perforation or abscess without bleeding: Secondary | ICD-10-CM | POA: Diagnosis not present

## 2020-09-03 MED ORDER — IOHEXOL 300 MG/ML  SOLN
100.0000 mL | Freq: Once | INTRAMUSCULAR | Status: AC | PRN
  Administered 2020-09-03: 100 mL via INTRAVENOUS

## 2020-09-03 MED ORDER — TECHNETIUM TC 99M MEDRONATE IV KIT
21.7000 | PACK | Freq: Once | INTRAVENOUS | Status: AC
  Administered 2020-09-03: 21.7 via INTRAVENOUS

## 2020-09-04 NOTE — Assessment & Plan Note (Signed)
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 N0 M0 stage IA: Pathologic stage TII, N0, M0 stage II A., 3.1 cm tumor ER/PR positive HER-2 negative, grade 3, perineural invasion present but no lymphovascular invasion. Oncotype DX recurrence score 4, 4% ROR  Current Treatment: Adjuvant Tamoxifen X 10 years (Osteoporosis) started November 2015  Tamoxifen toxicities: No major side effects of tamoxifen  Breast cancer surveillance: 1.Mammogramleft breastat [facility]/01/2020: Benign breast density category C. 2.breast exam 11/8/2021right mastectomy no palpable lumps or nodules, left breast no lumps or nodules.  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 I instructed her to do weightbearing exercises and yoga.  CT CAP 09/03/20: No evidence of metastatic disease Bone Scan 09/04/20: No evidence of bone mets  Plan: Surgical excision   Return to clinic in 1 year for follow-up

## 2020-09-04 NOTE — Progress Notes (Signed)
 Patient Care Team: Collins, Dana, DO as PCP - General (Family Medicine) Fernandez, Juan H, MD (Inactive) as Referring Physician (Gynecology) Cornett, Thomas, MD as Consulting Physician (General Surgery) Gudena, Vinay, MD as Consulting Physician (Hematology and Oncology) Moody, John, MD as Consulting Physician (Radiation Oncology)  DIAGNOSIS:    ICD-10-CM   1. Malignant neoplasm of upper-inner quadrant of right breast in female, estrogen receptor positive ([facility])  C50.211    Z17.0     SUMMARY OF ONCOLOGIC HISTORY: Oncology History  Breast cancer of upper-inner quadrant of right female breast ([facility])  06/12/2014 Mammogram   Right breast architectural distortion (dense breasts); Ultrasound 1.3 cm mass at 1:00 position additional masses behind this mass measuring 1.3 cm and 0.7 cm. These lumps were palpable   06/13/2014 Initial Diagnosis   Right breast: 1.3cm mass: Invasive mammary cancer probably ductal with mammary ca in situ ER 100% PR 80% gases on 30% HER-2 negative; posterior 1.3 cm mass: IDC ER 100% PR 90% gets some 20% HER-2 negative and 0.7 cm mass: DCIS   07/25/2014 Surgery   Right simple mastectomy: Invasive ductal carcinoma No LVI, Pos for PNI, 3.1 cm grade 3 with DCIS grade 3, ER 100%, PR 100%, HER-2/neu negative Ki-67 20% T2, N0, M0 stage II A., Oncotype DX recurrence over 4, ROR 4%   08/14/2014 -  Anti-estrogen oral therapy   Tamoxifen 20 mg daily     CHIEF COMPLIANT: Follow-upof recurrent right breast cancer  INTERVAL HISTORY: Lydia Roberts is a 74 y.o. with above-mentioned history of recurrent right breast cancer. 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 CT CAP and bone scan on 09/03/20 showed no evidence of metastases. She presents to the clinic today to discuss treatment options.   ALLERGIES:  is allergic to sulfur and codeine.  MEDICATIONS:  Current Outpatient Medications  Medication  Sig Dispense Refill  . cholecalciferol (VITAMIN D) 1000 UNITS tablet Take 1,000 Units by mouth daily.    . ezetimibe (ZETIA) 10 MG tablet Take 10 mg by mouth daily.    . levothyroxine (SYNTHROID, LEVOTHROID) 125 MCG tablet Take 125 mcg by mouth daily.    . LORazepam (ATIVAN) 1 MG tablet Take 1 tablet (1 mg total) by mouth 2 (two) times daily. Take one tablet 1 hour prior to CT scan and second tablet 30 minutes prior to CT scan. 2 tablet 0  . tamoxifen (NOLVADEX) 20 MG tablet Take 1 tablet (20 mg total) by mouth daily. 90 tablet 3   No current facility-administered medications for this visit.    PHYSICAL EXAMINATION: ECOG PERFORMANCE STATUS: 1 - Symptomatic but completely ambulatory  There were no vitals filed for this visit. There were no vitals filed for this visit.  BREAST: No palpable masses or nodules in either right or left breasts. No palpable axillary supraclavicular or infraclavicular adenopathy no breast tenderness or nipple discharge. (exam performed in the presence of a chaperone)  LABORATORY DATA:  I have reviewed the data as listed CMP Latest Ref Rng & Units 09/02/2020 09/22/2016 07/19/2014  Glucose 70 - 99 mg/dL 96 127(H) 121(H)  BUN 8 - 23 mg/dL 18 15 13  Creatinine 0.44 - 1.00 mg/dL 1.10(H) 0.98 0.96  Sodium 135 - 145 mmol/L 141 136 142  Potassium 3.5 - 5.1 mmol/L 4.2 3.7 4.5  Chloride 98 - 111 mmol/L 107 104 103  CO2 22 - 32 mmol/L 26 22 28  Calcium 8.9 - 10.3 mg/dL 9.2 8.8(L)   9.8  Total Protein 6.5 - 8.1 g/dL 7.2 6.7 7.5  Total Bilirubin 0.3 - 1.2 mg/dL 1.0 1.6(H) 1.4(H)  Alkaline Phos 38 - 126 U/L 42 36(L) 58  AST 15 - 41 U/L _0 ALT 0 - 44 U/L _1 Lab Results  Component Value Date   WBC 6.0 09/02/2020   HGB 12.4 09/02/2020   HCT 38.2 09/02/2020   MCV 93.4 09/02/2020   PLT 173 09/02/2020   NEUTROABS 2.4 09/02/2020    ASSESSMENT & PLAN:  Breast cancer of upper-inner quadrant of right female breast 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 N0 M0 stage IA: Pathologic stage TII, N0, M0 stage II A., 3.1 cm tumor ER/PR positive HER-2 negative, grade 3, perineural invasion present but no lymphovascular invasion. Oncotype DX recurrence score 4, 4% ROR (Original diagnosis of right breast cancer status post lumpectomy and radiation in the 1990s)  Current Treatment: Adjuvant Tamoxifen   (Osteoporosis) started November 2015 we will switch to anastrozole 09/05/2020  Tamoxifen toxicities: No major side effects of tamoxifen  Breast cancer surveillance: 1.Mammogramleft breastat [facility]/01/2020: Benign breast density category C. 2.breast exam 11/8/2021right mastectomy no palpable lumps or nodules, left breast no lumps or nodules.  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  CT CAP 09/03/20: No evidence of metastatic disease Bone Scan 09/04/20: No evidence of bone mets Fortunately there is no evidence of metastatic disease.  Plan: Surgical excision.  I sent a message to Dr. Brantley Stage for surgery. I recommend switching her from tamoxifen to anastrozole.  Return to clinic in 2 months for follow-up on anastrozole    No orders of the defined types were placed in this encounter.  The patient has a good understanding of the overall plan. she agrees with it. she will call with any problems that may develop before the next visit here.  Total time spent: 30 mins including face to face time and time spent for planning, charting and coordination of care  Nicholas Lose, MD 09/05/2020  I, Cloyde Reams Dorshimer, am acting as scribe for Dr. Nicholas Lose.  I have reviewed the above documentation for accuracy and completeness, and I agree with the above.

## 2020-09-05 ENCOUNTER — Other Ambulatory Visit: Payer: Self-pay

## 2020-09-05 ENCOUNTER — Inpatient Hospital Stay: Payer: Medicare Other | Attending: Hematology and Oncology | Admitting: Hematology and Oncology

## 2020-09-05 DIAGNOSIS — Z170 Estrogen receptor positive status [ER+]: Secondary | ICD-10-CM | POA: Diagnosis not present

## 2020-09-05 DIAGNOSIS — M810 Age-related osteoporosis without current pathological fracture: Secondary | ICD-10-CM | POA: Insufficient documentation

## 2020-09-05 DIAGNOSIS — C50211 Malignant neoplasm of upper-inner quadrant of right female breast: Secondary | ICD-10-CM | POA: Insufficient documentation

## 2020-09-05 DIAGNOSIS — Z79810 Long term (current) use of selective estrogen receptor modulators (SERMs): Secondary | ICD-10-CM | POA: Diagnosis not present

## 2020-09-05 DIAGNOSIS — Z9011 Acquired absence of right breast and nipple: Secondary | ICD-10-CM | POA: Insufficient documentation

## 2020-09-05 DIAGNOSIS — Z79899 Other long term (current) drug therapy: Secondary | ICD-10-CM | POA: Insufficient documentation

## 2020-09-05 MED ORDER — IBANDRONATE SODIUM 150 MG PO TABS: 150.0000 mg | ORAL_TABLET | ORAL | 3 refills | Status: DC

## 2020-09-05 MED ORDER — ANASTROZOLE 1 MG PO TABS: 1.0000 mg | ORAL_TABLET | Freq: Every day | ORAL | 3 refills | Status: DC

## 2020-09-09 ENCOUNTER — Ambulatory Visit: Payer: Self-pay | Admitting: Surgery

## 2020-09-09 NOTE — H&P (View-Only) (Signed)
Axel Filler Appointment: 08/15/2020 10:50 AM Location: [facility] Patient #: [id number] DOB: 1946-04-19 Married / Language: Lydia Roberts / Race: White Female   History of Present Illness [name] A. Loye Reininger MD; 08/15/2020 12:05 PM) Patient words: Lydia Roberts is a 74 y.o. with above-mentioned history of right breast cancer treated with mastectomy and who is now on anti-estrogen therapy with tamoxifen. She presents to the clinic today for follow-up. She was noted to have a small nodule involving her right superior flap from her mastectomy wound. She is unclear elements been there. She was sent by her medical oncologist for a punch biopsy of this to exclude recurrence. Overall, she is doing well without complaint.      07/25/2014 Surgery Right simple mastectomy: Invasive ductal carcinoma No LVI, Pos for PNI, 3.1 cm grade 3 with DCIS grade 3, ER 100%, PR 100%, HER-2/neu negative Ki-67 20% T2, N0, M0 stage II A., Oncotype DX recurrence over 4, ROR 4%.  The patient is a 74 year old female.   Allergies (Chanel Teressa Senter, CMA; 08/15/2020 10:30 AM) Sulfacetamide Sodium-Sulfur  CODEINE  Allergies Reconciled   Medication History (Chanel Teressa Senter, CMA; 08/15/2020 10:30 AM) Vitamin D (1000UNIT Tablet, Oral) Active. Tamoxifen Citrate (20MG Tablet, Oral) Active. Levothyroxine Sodium (125MCG Tablet, Oral) Active. Zetia (10MG Tablet, Oral daily) Active. Loratadine (10MG Tablet, Oral daily) Active. Medications Reconciled  Vitals (Chanel Nolan CMA; 08/15/2020 10:30 AM) 08/15/2020 10:30 AM Weight: 133.38 lb Height: 66in Body Surface Area: 1.68 m Body Mass Index: 21.53 kg/m  Temp.: 95.104F  Pulse: 110 (Regular)  BP: 126/72(Sitting, Left Arm, Standard)       Physical Exam (Bessye Stith A. Lynsey Ange MD; 08/15/2020 12:06 PM) General Mental Status-Alert. General Appearance-Consistent with stated age. Hydration-Well hydrated. Voice-Normal.  Breast Note: [employer] is intact. He is a 1 cm erythematous nodule in superior flap. This was palpable. Recommend a punch biopsy of this. The patient agreed after explaining the procedure as well as complications and other alternatives. Sterile conditions this area was prepped with ChloraPrep. 1% lidocaine was used and a 2 mm to 3 mm punch was taken without complication.   Musculoskeletal Normal Exam - Left-Upper Extremity Strength Normal and Lower Extremity Strength Normal. Normal Exam - Right-Upper Extremity Strength Normal and Lower Extremity Strength Normal.  Lymphatic Axillary  General Axillary Region: Bilateral - Description - Normal. Tenderness - Non Tender.    Assessment & Plan (Anuoluwapo Mefferd A. Jaxten Brosh MD; 08/15/2020 12:07 PM) HISTORY OF CANCER OF RIGHT BREAST (Z85.3) Story: lumpectomy/ALND/Radiation 1993 Impression: mastectomy chest nodule 3 mm punch performed without complication wound care instructions given follow up as needed Current Plans Pt Education - [facility] Free Text Education/Instructions: discussed with patient and provided information. PUNCH BIOPSY SKIN, FIRST LESION [phone]) The anatomy and pathophysiology of skin diseases is discussed. I made a recommendation for punch biopsy of the skin through dermis for diagnostic purposes. Risk of bleeding and other risks were discussed. Alternatives discussed. Patient agreed to proceed.  Patient was placed in decubitus position. Local anesthetic field block was placed. I used a 2 mm - 3 mm skin punch biopsy. I ensured hemostasis. Patient tolerated procedure well. We will followup on pathology with the patient and to help decide what further therapy is needed    Signed electronically by [name], MD (08/15/2020 12:07 PM)

## 2020-09-09 NOTE — H&P (Signed)
Axel Filler Appointment: 08/15/2020 10:50 AM Location: [facility] Patient #: [id number] DOB: 1946/09/17 Married / Language: Lydia Roberts / Race: White Female   History of Present Illness [name] A. Zayley Arras MD; 08/15/2020 12:05 PM) Patient words: Lydia Roberts is a 74 y.o. with above-mentioned history of right breast cancer treated with mastectomy and who is now on anti-estrogen therapy with tamoxifen. She presents to the clinic today for follow-up. She was noted to have a small nodule involving her right superior flap from her mastectomy wound. She is unclear elements been there. She was sent by her medical oncologist for a punch biopsy of this to exclude recurrence. Overall, she is doing well without complaint.      07/25/2014 Surgery Right simple mastectomy: Invasive ductal carcinoma No LVI, Pos for PNI, 3.1 cm grade 3 with DCIS grade 3, ER 100%, PR 100%, HER-2/neu negative Ki-67 20% T2, N0, M0 stage II A., Oncotype DX recurrence over 4, ROR 4%.  The patient is a 74 year old female.   Allergies (Chanel Teressa Senter, CMA; 08/15/2020 10:30 AM) Sulfacetamide Sodium-Sulfur  CODEINE  Allergies Reconciled   Medication History (Chanel Teressa Senter, CMA; 08/15/2020 10:30 AM) Vitamin D (1000UNIT Tablet, Oral) Active. Tamoxifen Citrate (20MG Tablet, Oral) Active. Levothyroxine Sodium (125MCG Tablet, Oral) Active. Zetia (10MG Tablet, Oral daily) Active. Loratadine (10MG Tablet, Oral daily) Active. Medications Reconciled  Vitals (Chanel Nolan CMA; 08/15/2020 10:30 AM) 08/15/2020 10:30 AM Weight: 133.38 lb Height: 66in Body Surface Area: 1.68 m Body Mass Index: 21.53 kg/m  Temp.: 95.6F  Pulse: 110 (Regular)  BP: 126/72(Sitting, Left Arm, Standard)       Physical Exam (Nylene Inlow A. Amiree No MD; 08/15/2020 12:06 PM) General Mental Status-Alert. General Appearance-Consistent with stated age. Hydration-Well hydrated. Voice-Normal.  Breast Note: [employer] is intact. He is a 1 cm erythematous nodule in superior flap. This was palpable. Recommend a punch biopsy of this. The patient agreed after explaining the procedure as well as complications and other alternatives. Sterile conditions this area was prepped with ChloraPrep. 1% lidocaine was used and a 2 mm to 3 mm punch was taken without complication.   Musculoskeletal Normal Exam - Left-Upper Extremity Strength Normal and Lower Extremity Strength Normal. Normal Exam - Right-Upper Extremity Strength Normal and Lower Extremity Strength Normal.  Lymphatic Axillary  General Axillary Region: Bilateral - Description - Normal. Tenderness - Non Tender.    Assessment & Plan (Kristin Lamagna A. Ras Kollman MD; 08/15/2020 12:07 PM) HISTORY OF CANCER OF RIGHT BREAST (Z85.3) Story: lumpectomy/ALND/Radiation 1993 Impression: mastectomy chest nodule 3 mm punch performed without complication wound care instructions given follow up as needed Current Plans Pt Education - [employer] Free Text Education/Instructions: discussed with patient and provided information. PUNCH BIOPSY SKIN, FIRST LESION [phone]) The anatomy and pathophysiology of skin diseases is discussed. I made a recommendation for punch biopsy of the skin through dermis for diagnostic purposes. Risk of bleeding and other risks were discussed. Alternatives discussed. Patient agreed to proceed.  Patient was placed in decubitus position. Local anesthetic field block was placed. I used a 2 mm - 3 mm skin punch biopsy. I ensured hemostasis. Patient tolerated procedure well. We will followup on pathology with the patient and to help decide what further therapy is needed    Signed electronically by [name], MD (08/15/2020 12:07 PM)

## 2020-09-25 ENCOUNTER — Other Ambulatory Visit: Payer: Self-pay

## 2020-09-25 ENCOUNTER — Encounter (HOSPITAL_BASED_OUTPATIENT_CLINIC_OR_DEPARTMENT_OTHER): Payer: Self-pay | Admitting: Surgery

## 2020-09-29 ENCOUNTER — Encounter (HOSPITAL_BASED_OUTPATIENT_CLINIC_OR_DEPARTMENT_OTHER)
Admission: RE | Admit: 2020-09-29 | Discharge: 2020-09-29 | Disposition: A | Discharge status: Home / Self Care | Payer: Medicare Other | Source: Ambulatory Visit | Attending: Surgery | Admitting: Surgery

## 2020-09-29 DIAGNOSIS — Z01812 Encounter for preprocedural laboratory examination: Secondary | ICD-10-CM | POA: Insufficient documentation

## 2020-09-29 LAB — COMPREHENSIVE METABOLIC PANEL
ALT: 14 U/L (ref 0–44)
AST: 21 U/L (ref 15–41)
Albumin: 3.8 g/dL (ref 3.5–5.0)
Alkaline Phosphatase: 39 U/L (ref 38–126)
Anion gap: 7 (ref 5–15)
BUN: 12 mg/dL (ref 8–23)
CO2: 27 mmol/L (ref 22–32)
Calcium: 9.1 mg/dL (ref 8.9–10.3)
Chloride: 104 mmol/L (ref 98–111)
Creatinine, Ser: 1.03 mg/dL — ABNORMAL HIGH (ref 0.44–1.00)
GFR, Estimated: 57 mL/min — ABNORMAL LOW (ref >60)
Glucose, Bld: 94 mg/dL (ref 70–99)
Potassium: 4.9 mmol/L (ref 3.5–5.1)
Sodium: 138 mmol/L (ref 135–145)
Total Bilirubin: 0.7 mg/dL (ref 0.3–1.2)
Total Protein: 6.7 g/dL (ref 6.5–8.1)

## 2020-09-29 LAB — CBC WITH DIFFERENTIAL/PLATELET
Abs Immature Granulocytes: 0.01 10*3/uL (ref 0.00–0.07)
Basophils Absolute: 0.1 10*3/uL (ref 0.0–0.1)
Basophils Relative: 1 %
Eosinophils Absolute: 0.1 10*3/uL (ref 0.0–0.5)
Eosinophils Relative: 2 %
HCT: 35.7 % — ABNORMAL LOW (ref 36.0–46.0)
Hemoglobin: 11.8 g/dL — ABNORMAL LOW (ref 12.0–15.0)
Immature Granulocytes: 0 %
Lymphocytes Relative: 47 %
Lymphs Abs: 2.5 10*3/uL (ref 0.7–4.0)
MCH: 31 pg (ref 26.0–34.0)
MCHC: 33.1 g/dL (ref 30.0–36.0)
MCV: 93.7 fL (ref 80.0–100.0)
Monocytes Absolute: 0.6 10*3/uL (ref 0.1–1.0)
Monocytes Relative: 12 %
Neutro Abs: 2 10*3/uL (ref 1.7–7.7)
Neutrophils Relative %: 38 %
Platelets: 157 10*3/uL (ref 150–400)
RBC: 3.81 MIL/uL — ABNORMAL LOW (ref 3.87–5.11)
RDW: 13.4 % (ref 11.5–15.5)
WBC: 5.3 10*3/uL (ref 4.0–10.5)
nRBC: 0 % (ref 0.0–0.2)

## 2020-10-03 ENCOUNTER — Other Ambulatory Visit (HOSPITAL_COMMUNITY)
Admission: RE | Admit: 2020-10-03 | Discharge: 2020-10-03 | Disposition: A | Discharge status: Home / Self Care | Payer: Medicare Other | Source: Ambulatory Visit | Attending: Surgery | Admitting: Surgery

## 2020-10-03 DIAGNOSIS — Z01812 Encounter for preprocedural laboratory examination: Secondary | ICD-10-CM | POA: Diagnosis not present

## 2020-10-03 DIAGNOSIS — Z20822 Contact with and (suspected) exposure to covid-19: Secondary | ICD-10-CM | POA: Insufficient documentation

## 2020-10-03 LAB — SARS CORONAVIRUS 2 (TAT 6-24 HRS): SARS Coronavirus 2: NEGATIVE

## 2020-10-06 NOTE — Anesthesia Preprocedure Evaluation (Addendum)
Anesthesia Evaluation  Patient identified by MRN, date of birth, ID band Patient awake    Reviewed: Allergy & Precautions, Patient's Chart, lab work & pertinent test results  Airway Mallampati: II  TM Distance: >3 FB Neck ROM: Full    Dental no notable dental hx. (+) Teeth Intact, Dental Advisory Given   Pulmonary neg pulmonary ROS,    Pulmonary exam normal breath sounds clear to auscultation       Cardiovascular Exercise Tolerance: Good negative cardio ROS Normal cardiovascular exam Rhythm:Regular Rate:Normal     Neuro/Psych negative neurological ROS     GI/Hepatic negative GI ROS, Neg liver ROS,   Endo/Other  Hypothyroidism   Renal/GU negative Renal ROS  negative genitourinary   Musculoskeletal   Abdominal   Peds negative pediatric ROS (+)  Hematology Lab Results      Component                Value               Date                      WBC                      5.3                 09/29/2020                HGB                      11.8 (L)            09/29/2020                HCT                      35.7 (L)            09/29/2020                MCV                      93.7                09/29/2020                PLT                      157                 09/29/2020              Anesthesia Other Findings  HX of Breast CA All sulfa, codeine  Reproductive/Obstetrics negative OB ROS                            Anesthesia Physical Anesthesia Plan  ASA: III  Anesthesia Plan: General   Post-op Pain Management:    Induction: Intravenous  PONV Risk Score and Plan: 3 and Treatment may vary due to age or medical condition and Ondansetron  Airway Management Planned: LMA  Additional Equipment: None  Intra-op Plan:   Post-operative Plan: Extubation in OR  Informed Consent: I have reviewed the patients History and Physical, chart, labs and discussed the procedure including the  risks, benefits and alternatives for the proposed anesthesia with the patient or authorized representative who has  indicated his/her understanding and acceptance.     Dental advisory given  Plan Discussed with: CRNA and Anesthesiologist  Anesthesia Plan Comments:        Anesthesia Quick Evaluation

## 2020-10-07 ENCOUNTER — Ambulatory Visit (HOSPITAL_BASED_OUTPATIENT_CLINIC_OR_DEPARTMENT_OTHER): Payer: Medicare Other | Admitting: Anesthesiology

## 2020-10-07 ENCOUNTER — Ambulatory Visit (HOSPITAL_BASED_OUTPATIENT_CLINIC_OR_DEPARTMENT_OTHER)
Admission: RE | Admit: 2020-10-07 | Discharge: 2020-10-07 | Disposition: A | Discharge status: Home / Self Care | Payer: Medicare Other | Attending: Surgery | Admitting: Surgery

## 2020-10-07 ENCOUNTER — Other Ambulatory Visit: Payer: Self-pay

## 2020-10-07 ENCOUNTER — Encounter (HOSPITAL_BASED_OUTPATIENT_CLINIC_OR_DEPARTMENT_OTHER)
Admission: RE | Disposition: A | Discharge status: Home / Self Care | Payer: Self-pay | Source: Home / Self Care | Attending: Surgery

## 2020-10-07 ENCOUNTER — Encounter (HOSPITAL_BASED_OUTPATIENT_CLINIC_OR_DEPARTMENT_OTHER): Payer: Self-pay | Admitting: Surgery

## 2020-10-07 DIAGNOSIS — E039 Hypothyroidism, unspecified: Secondary | ICD-10-CM | POA: Diagnosis not present

## 2020-10-07 DIAGNOSIS — C50911 Malignant neoplasm of unspecified site of right female breast: Secondary | ICD-10-CM | POA: Insufficient documentation

## 2020-10-07 DIAGNOSIS — C493 Malignant neoplasm of connective and soft tissue of thorax: Secondary | ICD-10-CM | POA: Diagnosis not present

## 2020-10-07 DIAGNOSIS — Z9011 Acquired absence of right breast and nipple: Secondary | ICD-10-CM | POA: Insufficient documentation

## 2020-10-07 DIAGNOSIS — C44591 Other specified malignant neoplasm of skin of breast: Secondary | ICD-10-CM | POA: Diagnosis not present

## 2020-10-07 DIAGNOSIS — Z882 Allergy status to sulfonamides status: Secondary | ICD-10-CM | POA: Diagnosis not present

## 2020-10-07 DIAGNOSIS — C50211 Malignant neoplasm of upper-inner quadrant of right female breast: Secondary | ICD-10-CM | POA: Diagnosis not present

## 2020-10-07 DIAGNOSIS — C7989 Secondary malignant neoplasm of other specified sites: Secondary | ICD-10-CM | POA: Insufficient documentation

## 2020-10-07 DIAGNOSIS — Z885 Allergy status to narcotic agent status: Secondary | ICD-10-CM | POA: Insufficient documentation

## 2020-10-07 DIAGNOSIS — Z79899 Other long term (current) drug therapy: Secondary | ICD-10-CM | POA: Insufficient documentation

## 2020-10-07 DIAGNOSIS — Z79810 Long term (current) use of selective estrogen receptor modulators (SERMs): Secondary | ICD-10-CM | POA: Insufficient documentation

## 2020-10-07 DIAGNOSIS — Z79890 Hormone replacement therapy (postmenopausal): Secondary | ICD-10-CM | POA: Insufficient documentation

## 2020-10-07 DIAGNOSIS — M1990 Unspecified osteoarthritis, unspecified site: Secondary | ICD-10-CM | POA: Diagnosis not present

## 2020-10-07 HISTORY — PX: MASS EXCISION: SHX2000

## 2020-10-07 SURGERY — EXCISION MASS
Anesthesia: General | Site: Chest | Laterality: Right

## 2020-10-07 MED ORDER — CEFAZOLIN SODIUM-DEXTROSE 2-4 GM/100ML-% IV SOLN
2.0000 g | INTRAVENOUS | Status: AC
  Administered 2020-10-07: 2 g via INTRAVENOUS

## 2020-10-07 MED ORDER — BUPIVACAINE HCL (PF) 0.25 % IJ SOLN
INTRAMUSCULAR | Status: AC
  Filled 2020-10-07: qty 30

## 2020-10-07 MED ORDER — CEFAZOLIN SODIUM-DEXTROSE 2-4 GM/100ML-% IV SOLN
INTRAVENOUS | Status: AC
  Filled 2020-10-07: qty 100

## 2020-10-07 MED ORDER — DEXAMETHASONE SODIUM PHOSPHATE 4 MG/ML IJ SOLN
INTRAMUSCULAR | Status: DC | PRN
  Administered 2020-10-07: 4 mg via INTRAVENOUS

## 2020-10-07 MED ORDER — FENTANYL CITRATE (PF) 100 MCG/2ML IJ SOLN: 25.0000 ug | INTRAMUSCULAR | Status: DC | PRN

## 2020-10-07 MED ORDER — PROPOFOL 10 MG/ML IV BOLUS
INTRAVENOUS | Status: AC
  Filled 2020-10-07: qty 20

## 2020-10-07 MED ORDER — ACETAMINOPHEN 10 MG/ML IV SOLN: 1000.0000 mg | Freq: Once | INTRAVENOUS | Status: DC | PRN

## 2020-10-07 MED ORDER — IBUPROFEN 800 MG PO TABS: 800.0000 mg | ORAL_TABLET | Freq: Three times a day (TID) | ORAL | 0 refills | Status: DC | PRN

## 2020-10-07 MED ORDER — FENTANYL CITRATE (PF) 100 MCG/2ML IJ SOLN
INTRAMUSCULAR | Status: AC
  Filled 2020-10-07: qty 2

## 2020-10-07 MED ORDER — LIDOCAINE 2% (20 MG/ML) 5 ML SYRINGE
INTRAMUSCULAR | Status: AC
  Filled 2020-10-07: qty 5

## 2020-10-07 MED ORDER — BUPIVACAINE HCL (PF) 0.25 % IJ SOLN
INTRAMUSCULAR | Status: DC | PRN
Start: 2020-10-07 — End: 2020-10-07
  Administered 2020-10-07: 10 mL

## 2020-10-07 MED ORDER — EPHEDRINE SULFATE 50 MG/ML IJ SOLN
INTRAMUSCULAR | Status: DC | PRN
  Administered 2020-10-07 (×2): 10 mg via INTRAVENOUS

## 2020-10-07 MED ORDER — PROPOFOL 10 MG/ML IV BOLUS
INTRAVENOUS | Status: DC | PRN
  Administered 2020-10-07: 130 mg via INTRAVENOUS

## 2020-10-07 MED ORDER — FENTANYL CITRATE (PF) 100 MCG/2ML IJ SOLN
INTRAMUSCULAR | Status: DC | PRN
  Administered 2020-10-07 (×2): 25 ug via INTRAVENOUS

## 2020-10-07 MED ORDER — ONDANSETRON HCL 4 MG/2ML IJ SOLN
INTRAMUSCULAR | Status: DC | PRN
  Administered 2020-10-07: 4 mg via INTRAVENOUS

## 2020-10-07 MED ORDER — LACTATED RINGERS IV SOLN: INTRAVENOUS | Status: DC

## 2020-10-07 MED ORDER — CHLORHEXIDINE GLUCONATE CLOTH 2 % EX PADS: 6.0000 | MEDICATED_PAD | Freq: Once | CUTANEOUS | Status: DC

## 2020-10-07 MED ORDER — LIDOCAINE HCL (CARDIAC) PF 100 MG/5ML IV SOSY
PREFILLED_SYRINGE | INTRAVENOUS | Status: DC | PRN
  Administered 2020-10-07: 80 mg via INTRAVENOUS

## 2020-10-07 MED ORDER — ONDANSETRON HCL 4 MG/2ML IJ SOLN: 4.0000 mg | Freq: Once | INTRAMUSCULAR | Status: DC | PRN

## 2020-10-07 MED ORDER — ONDANSETRON HCL 4 MG/2ML IJ SOLN
INTRAMUSCULAR | Status: AC
  Filled 2020-10-07: qty 2

## 2020-10-07 SURGICAL SUPPLY — 48 items
ADH SKN CLS APL DERMABOND .7 (GAUZE/BANDAGES/DRESSINGS) ×1
APL PRP STRL LF DISP 70% ISPRP (MISCELLANEOUS) ×1
APL SKNCLS STERI-STRIP NONHPOA (GAUZE/BANDAGES/DRESSINGS)
BENZOIN TINCTURE PRP APPL 2/3 (GAUZE/BANDAGES/DRESSINGS) IMPLANT
BLADE SURG 10 STRL SS (BLADE) IMPLANT
BLADE SURG 15 STRL LF DISP TIS (BLADE) ×1 IMPLANT
BLADE SURG 15 STRL SS (BLADE) ×2
BNDG ELASTIC 4X5.8 VLCR STR LF (GAUZE/BANDAGES/DRESSINGS) IMPLANT
CANISTER SUCT 1200ML W/VALVE (MISCELLANEOUS) IMPLANT
CHLORAPREP W/TINT 26 (MISCELLANEOUS) ×2 IMPLANT
COVER BACK TABLE 60X90IN (DRAPES) ×2 IMPLANT
COVER MAYO STAND STRL (DRAPES) ×2 IMPLANT
COVER WAND RF STERILE (DRAPES) IMPLANT
DECANTER SPIKE VIAL GLASS SM (MISCELLANEOUS) IMPLANT
DERMABOND ADVANCED (GAUZE/BANDAGES/DRESSINGS) ×1
DERMABOND ADVANCED .7 DNX12 (GAUZE/BANDAGES/DRESSINGS) IMPLANT
DRAPE LAPAROTOMY 100X72 PEDS (DRAPES) ×2 IMPLANT
DRAPE UTILITY XL STRL (DRAPES) ×2 IMPLANT
ELECT COATED BLADE 2.86 ST (ELECTRODE) ×2 IMPLANT
ELECT REM PT RETURN 9FT ADLT (ELECTROSURGICAL) ×2
ELECTRODE REM PT RTRN 9FT ADLT (ELECTROSURGICAL) ×1 IMPLANT
GLOVE ECLIPSE 8.0 STRL XLNG CF (GLOVE) ×2 IMPLANT
GLOVE SRG 8 PF TXTR STRL LF DI (GLOVE) ×1 IMPLANT
GLOVE SURG SYN 7.0 (GLOVE) ×2 IMPLANT
GLOVE SURG SYN 7.0 PF PI (GLOVE) IMPLANT
GLOVE SURG UNDER POLY LF SZ8 (GLOVE) ×2
GOWN STRL REUS W/ TWL LRG LVL3 (GOWN DISPOSABLE) ×2 IMPLANT
GOWN STRL REUS W/ TWL XL LVL3 (GOWN DISPOSABLE) ×1 IMPLANT
GOWN STRL REUS W/TWL LRG LVL3 (GOWN DISPOSABLE) ×4
GOWN STRL REUS W/TWL XL LVL3 (GOWN DISPOSABLE) ×2
KIT MARKER MARGIN INK (KITS) ×1 IMPLANT
NDL HYPO 25X1 1.5 SAFETY (NEEDLE) ×1 IMPLANT
NEEDLE HYPO 25X1 1.5 SAFETY (NEEDLE) ×2 IMPLANT
NS IRRIG 1000ML POUR BTL (IV SOLUTION) ×1 IMPLANT
PACK BASIN DAY SURGERY FS (CUSTOM PROCEDURE TRAY) ×2 IMPLANT
PENCIL SMOKE EVACUATOR (MISCELLANEOUS) ×2 IMPLANT
SLEEVE SCD COMPRESS KNEE MED (MISCELLANEOUS) ×2 IMPLANT
SPONGE LAP 4X18 RFD (DISPOSABLE) ×1 IMPLANT
STAPLER VISISTAT 35W (STAPLE) IMPLANT
STRIP CLOSURE SKIN 1/2X4 (GAUZE/BANDAGES/DRESSINGS) IMPLANT
SUT MON AB 4-0 PC3 18 (SUTURE) ×2 IMPLANT
SUT VIC AB 0 SH 27 (SUTURE) ×2 IMPLANT
SUT VICRYL 3-0 CR8 SH (SUTURE) ×2 IMPLANT
SUT VICRYL AB 3 0 TIES (SUTURE) IMPLANT
SYR CONTROL 10ML LL (SYRINGE) ×2 IMPLANT
TOWEL GREEN STERILE FF (TOWEL DISPOSABLE) ×3 IMPLANT
TUBE CONNECTING 20X1/4 (TUBING) ×1 IMPLANT
YANKAUER SUCT BULB TIP NO VENT (SUCTIONS) ×1 IMPLANT

## 2020-10-07 NOTE — Interval H&P Note (Signed)
History and Physical Interval Note:  10/07/2020 12:44 PM  Lydia Roberts  has presented today for surgery, with the diagnosis of CHEST WALL MASS.  The various methods of treatment have been discussed with the patient and family. After consideration of risks, benefits and other options for treatment, the patient has consented to  Procedure(s): EXCISION RIGHT CHEST WALL MASS (Right) as a surgical intervention.  The patient's history has been reviewed, patient examined, no change in status, stable for surgery.  I have reviewed the patient's chart and labs.  Questions were answered to the patient's satisfaction.     Dortha Schwalbe MD

## 2020-10-07 NOTE — Op Note (Signed)
Preoperative diagnosis: Recurrent right breast cancer along previous right mastectomy scar  Postoperative diagnosis: Same  Procedure: Excision of right chest wall mass measuring 4 cm x 3 cm  Surgeon: Erroll Luna, MD  Anesthesia: General with 0.25% Marcaine plain  EBL: Minimal  Specimen: Right chest wall and skin to pathology with grossly negative margins.  Specimen was oriented with ink.  Indications for procedure: The patient 75 year old female with history of right breast cancer treated with mastectomy.  She presents with a small skin based lesion core biopsy proven to be consistent with recurrent right breast cancer.  This case was reviewed with oncology we both felt that wide excision would provide good local control at this point time.  Risks and benefits surgery were discussed well potential complications.The procedure has been discussed with the patient.  Alternative therapies have been discussed with the patient.  Operative risks include bleeding,  Infection,  Organ injury,  Nerve injury,  Blood vessel injury,  DVT,  Pulmonary embolism,  Death,  And possible reoperation.  Medical management risks include worsening of present situation.  The success of the procedure is 50 -90 % at treating patients symptoms.  The patient understands and agrees to proceed.  Description of procedure: The patient was met in the holding area and questions were answered.  The lesion was marked with the assistance of the patient to the right chest wall.  The procedure was reviewed.  She was then taken back to the operating.  She is placed supine upon the OR table.  After induction of general esthesia the right chest wall was prepped and draped in sterile fashion timeout performed.  Lesion was identified.  Ellipse of skin was taken above and below the lesion which measured about 1-1/2 cm by itself a total where excision was closer to 4 x 3 cm.  This was taken down to the pectoralis muscle.  The skin was excised.   Hemostasis achieved.  The specimen was oriented and sent to pathology.  Skin closed with a deep layer of 0 Vicryl and subsequent 4 Monocryl layer.  Dermabond applied.  All counts were found to be correct.  The patient was awoke extubated taken to recovery in satisfactory condition.

## 2020-10-07 NOTE — Discharge Instructions (Signed)
GENERAL SURGERY: POST OP INSTRUCTIONS  ######################################################################  EAT Gradually transition to a high fiber diet with a fiber supplement over the next few weeks after discharge.  Start with a pureed / full liquid diet (see below)  WALK Walk an hour a day.  Control your pain to do that.    CONTROL PAIN Control pain so that you can walk, sleep, tolerate sneezing/coughing, go up/down stairs.  HAVE A BOWEL MOVEMENT DAILY Keep your bowels regular to avoid problems.  OK to try a laxative to override constipation.  OK to use an antidairrheal to slow down diarrhea.  Call if not better after 2 tries  CALL IF YOU HAVE PROBLEMS/CONCERNS Call if you are still struggling despite following these instructions. Call if you have concerns not answered by these instructions  ######################################################################    1. DIET: Follow a light bland diet & liquids the first 24 hours after arrival home, such as soup, liquids, starches, etc.  Be sure to drink plenty of fluids.  Quickly advance to a usual solid diet within a few days.  Avoid fast food or heavy meals as your are more likely to get nauseated or have irregular bowels.  A low-fat, high-fiber diet for the rest of your life is ideal.   2. Take your usually prescribed home medications unless otherwise directed. 3. PAIN CONTROL: a. Pain is best controlled by a usual combination of three different methods TOGETHER: i. Ice/Heat ii. Over the counter pain medication iii. Prescription pain medication b. Most patients will experience some swelling and bruising around the incisions.  Ice packs or heating pads (30-60 minutes up to 6 times a day) will help. Use ice for the first few days to help decrease swelling and bruising, then switch to heat to help relax tight/sore spots and speed recovery.  Some people prefer to use ice alone, heat alone, alternating between ice & heat.   Experiment to what works for you.  Swelling and bruising can take several weeks to resolve.   c. It is helpful to take an over-the-counter pain medication regularly for the first few weeks.  Choose one of the following that works best for you: i. Naproxen (Aleve, etc)  Two 220mg tabs twice a day ii. Ibuprofen (Advil, etc) Three 200mg tabs four times a day (every meal & bedtime) iii. Acetaminophen (Tylenol, etc) 500-650mg four times a day (every meal & bedtime) d. A  prescription for pain medication (such as oxycodone, hydrocodone, etc) should be given to you upon discharge.  Take your pain medication as prescribed.  i. If you are having problems/concerns with the prescription medicine (does not control pain, nausea, vomiting, rash, itching, etc), please call us [phone] to see if we need to switch you to a different pain medicine that will work better for you and/or control your side effect better. ii. If you need a refill on your pain medication, please contact your pharmacy.  They will contact our office to request authorization. Prescriptions will not be filled after 5 pm or on week-ends. 4. Avoid getting constipated.  Between the surgery and the pain medications, it is common to experience some constipation.  Increasing fluid intake and taking a fiber supplement (such as Metamucil, Citrucel, FiberCon, MiraLax, etc) 1-2 times a day regularly will usually help prevent this problem from occurring.  A mild laxative (prune juice, Milk of Magnesia, MiraLax, etc) should be taken according to package directions if there are no bowel movements after 48 hours.   5. Wash /   shower every day.  You may shower over the dressings as they are waterproof.  Continue to shower over incision(s) after the dressing is off. 6. Remove your waterproof bandages 5 days after surgery.  You may leave the incision open to air.  You may have skin tapes (Steri Strips) covering the incision(s).  Leave them on until one week, then  remove.  You may replace a dressing/Band-Aid to cover the incision for comfort if you wish.      7. ACTIVITIES as tolerated:   a. You may resume regular (light) daily activities beginning the next day--such as daily self-care, walking, climbing stairs--gradually increasing activities as tolerated.  If you can walk 30 minutes without difficulty, it is safe to try more intense activity such as jogging, treadmill, bicycling, low-impact aerobics, swimming, etc. b. Save the most intensive and strenuous activity for last such as sit-ups, heavy lifting, contact sports, etc  Refrain from any heavy lifting or straining until you are off narcotics for pain control.   c. DO NOT PUSH THROUGH PAIN.  Let pain be your guide: If it hurts to do something, don't do it.  Pain is your body warning you to avoid that activity for another week until the pain goes down. d. You may drive when you are no longer taking prescription pain medication, you can comfortably wear a seatbelt, and you can safely maneuver your car and apply brakes. e. You may have sexual intercourse when it is comfortable.  8. FOLLOW UP in our office a. Please call [facility] at [phone] to set up an appointment to see your surgeon in the office for a follow-up appointment approximately 2-3 weeks after your surgery. b. Make sure that you call for this appointment the day you arrive home to insure a convenient appointment time. 9. IF YOU HAVE DISABILITY OR FAMILY LEAVE FORMS, BRING THEM TO THE OFFICE FOR PROCESSING.  DO NOT GIVE THEM TO YOUR DOCTOR.   WHEN TO CALL US [phone]: 1. Poor pain control 2. Reactions / problems with new medications (rash/itching, nausea, etc)  3. Fever over 101.5 F (38.5 C) 4. Worsening swelling or bruising 5. Continued bleeding from incision. 6. Increased pain, redness, or drainage from the incision 7. Difficulty breathing / swallowing   The clinic staff is available to answer your questions during regular  business hours (8:30am-5pm).  Please don't hesitate to call and ask to speak to one of our nurses for clinical concerns.   If you have a medical emergency, go to the nearest emergency room or call 911.  A surgeon from [facility] is always on call at the hospitals   [facility] [address], [address], [place], [place]  [address] ? MAIN: [phone] ? TOLL FREE: [phone] ?  FAX [phone] [email or web address]    Post Anesthesia Home Care Instructions  Activity: Get plenty of rest for the remainder of the day. A responsible individual must stay with you for 24 hours following the procedure.  For the next 24 hours, DO NOT: -Drive a car -Operate machinery -Drink alcoholic beverages -Take any medication unless instructed by your physician -Make any legal decisions or sign important papers.  Meals: Start with liquid foods such as gelatin or soup. Progress to regular foods as tolerated. Avoid greasy, spicy, heavy foods. If nausea and/or vomiting occur, drink only clear liquids until the nausea and/or vomiting subsides. Call your physician if vomiting continues.  Special Instructions/Symptoms: Your throat may feel dry or   sore from the anesthesia or the breathing tube placed in your throat during surgery. If this causes discomfort, gargle with warm salt water. The discomfort should disappear within 24 hours.  If you had a scopolamine patch placed behind your ear for the management of post- operative nausea and/or vomiting:  1. The medication in the patch is effective for 72 hours, after which it should be removed.  Wrap patch in a tissue and discard in the trash. Wash hands thoroughly with soap and water. 2. You may remove the patch earlier than 72 hours if you experience unpleasant side effects which may include dry mouth, dizziness or visual disturbances. 3. Avoid touching the patch. Wash your hands with soap and water after  contact with the patch.     

## 2020-10-07 NOTE — Anesthesia Procedure Notes (Signed)
Procedure Name: LMA Insertion Date/Time: 10/07/2020 1:02 PM Performed by: Burna Cash, CRNA Pre-anesthesia Checklist: Patient identified, Emergency Drugs available, Suction available and Patient being monitored Patient Re-evaluated:Patient Re-evaluated prior to induction Oxygen Delivery Method: Circle system utilized Preoxygenation: Pre-oxygenation with 100% oxygen Induction Type: IV induction Ventilation: Mask ventilation without difficulty LMA: LMA inserted LMA Size: 4.0 Number of attempts: 1 Airway Equipment and Method: Bite block Placement Confirmation: positive ETCO2 Tube secured with: Tape Dental Injury: Teeth and Oropharynx as per pre-operative assessment

## 2020-10-07 NOTE — Anesthesia Postprocedure Evaluation (Signed)
Anesthesia Post Note  Patient: Lydia Roberts  Procedure(s) Performed: EXCISION RIGHT CHEST WALL MASS (Right Chest)     Patient location during evaluation: PACU Anesthesia Type: General Level of consciousness: awake and alert Pain management: pain level controlled Vital Signs Assessment: post-procedure vital signs reviewed and stable Respiratory status: spontaneous breathing, nonlabored ventilation, respiratory function stable and patient connected to nasal cannula oxygen Cardiovascular status: blood pressure returned to baseline and stable Postop Assessment: no apparent nausea or vomiting Anesthetic complications: no   No complications documented.  Last Vitals:  Vitals:   10/07/20 1153 10/07/20 1345  BP: (!) 122/57 (!) 123/47  Pulse: 71 81  Resp: 16 13  Temp: 37 C 36.6 C  SpO2: 100% 100%    Last Pain:  Vitals:   10/07/20 1345  TempSrc:   PainSc: Asleep                 Trevor Iha

## 2020-10-07 NOTE — Transfer of Care (Signed)
Immediate Anesthesia Transfer of Care Note  Patient: LAKEYTA VANDENHEUVEL  Procedure(s) Performed: EXCISION RIGHT CHEST WALL MASS (Right Chest)  Patient Location: PACU  Anesthesia Type:General  Level of Consciousness: sedated  Airway & Oxygen Therapy: Patient Spontanous Breathing and Patient connected to face mask oxygen  Post-op Assessment: Report given to RN and Post -op Vital signs reviewed and stable  Post vital signs: Reviewed and stable  Last Vitals:  Vitals Value Taken Time  BP 123/47 10/07/20 1345  Temp    Pulse 83 10/07/20 1346  Resp 12 10/07/20 1346  SpO2 100 % 10/07/20 1346  Vitals shown include unvalidated device data.  Last Pain:  Vitals:   10/07/20 1153  TempSrc: Oral  PainSc: 0-No pain      Patients Stated Pain Goal: 3 (10/07/20 1153)  Complications: No complications documented.

## 2020-10-08 ENCOUNTER — Encounter (HOSPITAL_BASED_OUTPATIENT_CLINIC_OR_DEPARTMENT_OTHER): Payer: Self-pay | Admitting: Surgery

## 2020-10-09 LAB — SURGICAL PATHOLOGY

## 2020-10-16 ENCOUNTER — Other Ambulatory Visit: Payer: Self-pay

## 2020-10-16 MED ORDER — ANASTROZOLE 1 MG PO TABS: 1.0000 mg | ORAL_TABLET | Freq: Every day | ORAL | 0 refills | Status: DC

## 2020-10-23 DIAGNOSIS — E785 Hyperlipidemia, unspecified: Secondary | ICD-10-CM | POA: Diagnosis not present

## 2020-10-23 DIAGNOSIS — E559 Vitamin D deficiency, unspecified: Secondary | ICD-10-CM | POA: Diagnosis not present

## 2020-10-23 DIAGNOSIS — R300 Dysuria: Secondary | ICD-10-CM | POA: Diagnosis not present

## 2020-10-23 DIAGNOSIS — Z0000 Encounter for general adult medical examination without abnormal findings: Secondary | ICD-10-CM | POA: Diagnosis not present

## 2020-10-23 DIAGNOSIS — E039 Hypothyroidism, unspecified: Secondary | ICD-10-CM | POA: Diagnosis not present

## 2020-10-31 DIAGNOSIS — Z0000 Encounter for general adult medical examination without abnormal findings: Secondary | ICD-10-CM | POA: Diagnosis not present

## 2020-10-31 DIAGNOSIS — E039 Hypothyroidism, unspecified: Secondary | ICD-10-CM | POA: Diagnosis not present

## 2020-10-31 DIAGNOSIS — M810 Age-related osteoporosis without current pathological fracture: Secondary | ICD-10-CM | POA: Diagnosis not present

## 2020-10-31 DIAGNOSIS — E7800 Pure hypercholesterolemia, unspecified: Secondary | ICD-10-CM | POA: Diagnosis not present

## 2020-10-31 DIAGNOSIS — C50919 Malignant neoplasm of unspecified site of unspecified female breast: Secondary | ICD-10-CM | POA: Diagnosis not present

## 2020-10-31 DIAGNOSIS — E785 Hyperlipidemia, unspecified: Secondary | ICD-10-CM | POA: Diagnosis not present

## 2020-11-10 NOTE — Progress Notes (Signed)
TELEPHONE VISIT   Patient Care Team: Janie Morning, DO as PCP - General (Family Medicine) Terrance Mass, MD (Inactive) as Referring Physician (Gynecology) Erroll Luna, MD as Consulting Physician (General Surgery) Nicholas Lose, MD as Consulting Physician (Hematology and Oncology) Kyung Rudd, MD as Consulting Physician (Radiation Oncology)  DIAGNOSIS:    ICD-10-CM   1. Malignant neoplasm of upper-inner quadrant of right breast in female, estrogen receptor positive ([place])  C50.211    Z17.0     SUMMARY OF ONCOLOGIC HISTORY: Oncology History  Breast cancer of upper-inner quadrant of right female breast ([place])  06/12/2014 Mammogram   Right breast architectural distortion (dense breasts); Ultrasound 1.3 cm mass at 1:00 position additional masses behind this mass measuring 1.3 cm and 0.7 cm. These lumps were palpable   06/13/2014 Initial Diagnosis   Right breast: 1.3cm mass: Invasive mammary cancer probably ductal with mammary ca in situ ER 100% PR 80% gases on 30% HER-2 negative; posterior 1.3 cm mass: IDC ER 100% PR 90% gets some 20% HER-2 negative and 0.7 cm mass: DCIS   07/25/2014 Surgery   Right simple mastectomy: Invasive ductal carcinoma No LVI, Pos for PNI, 3.1 cm grade 3 with DCIS grade 3, ER 100%, PR 100%, HER-2/neu negative Ki-67 20% T2, N0, M0 stage II A., Oncotype DX recurrence over 4, ROR 4%   08/14/2014 - 09/2020 Anti-estrogen oral therapy   Tamoxifen 20 mg daily   08/24/2020 Relapse/Recurrence   Right chest wall postmastectomy skin erythema with slight palpable nodularity. 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 CT CAP and bone scan on 09/03/20 showed no evidence of metastases.   10/07/2020 Surgery   Resection of right chest mass ([facility]): poorly differentiated carcinoma, with positive inferior and deep margins.    10/2020 -  Anti-estrogen oral therapy   Switched to anastrozole after  resection of recurrence      CHIEF COMPLIANT: Follow-upof recurrent right breast cancer  INTERVAL HISTORY: Lydia Roberts is a 75 y.o. with above-mentioned history of recurrent right breast cancer. She underwent a resection of the mass at the right mastectomy scar on 10/07/20 with Dr. Brantley Stage for which pathology showed poorly differentiated carcinoma, with positive inferior and deep margins. She is currently on antiestrogen therapy with anastrozole. She presents to the clinic today for follow-up.   ALLERGIES:  is allergic to elemental sulfur and codeine.  MEDICATIONS:  Current Outpatient Medications  Medication Sig Dispense Refill  . anastrozole (ARIMIDEX) 1 MG tablet Take 1 tablet (1 mg total) by mouth daily. 90 tablet 0  . cholecalciferol (VITAMIN D) 1000 UNITS tablet Take 1,000 Units by mouth daily.    Marland Kitchen ezetimibe (ZETIA) 10 MG tablet Take 10 mg by mouth daily.    Marland Kitchen ibandronate (BONIVA) 150 MG tablet Take 1 tablet (150 mg total) by mouth every 30 (thirty) days. Take in the morning with a full glass of water, on an empty stomach, and do not take anything else by mouth or lie down for the next 30 min. 3 tablet 3  . ibuprofen (ADVIL) 800 MG tablet Take 1 tablet (800 mg total) by mouth every 8 (eight) hours as needed. 30 tablet 0  . levothyroxine (SYNTHROID, LEVOTHROID) 125 MCG tablet Take 100 mcg by mouth daily.     No current facility-administered medications for this visit.    PHYSICAL EXAMINATION: ECOG PERFORMANCE STATUS: 1 - Symptomatic but completely ambulatory  There were no vitals filed for this visit. There  were no vitals filed for this visit.  LABORATORY DATA:  I have reviewed the data as listed CMP Latest Ref Rng & Units 09/29/2020 09/02/2020 09/22/2016  Glucose 70 - 99 mg/dL 94 96 127(H)  BUN 8 - 23 mg/dL _0 Creatinine 0.44 - 1.00 mg/dL 1.03(H) 1.10(H) 0.98  Sodium 135 - 145 mmol/L 138 141 136  Potassium 3.5 - 5.1 mmol/L 4.9 4.2 3.7  Chloride 98 - 111 mmol/L 104  107 104  CO2 22 - 32 mmol/L _1 Calcium 8.9 - 10.3 mg/dL 9.1 9.2 8.8(L)  Total Protein 6.5 - 8.1 g/dL 6.7 7.2 6.7  Total Bilirubin 0.3 - 1.2 mg/dL 0.7 1.0 1.6(H)  Alkaline Phos 38 - 126 U/L 39 42 36(L)  AST 15 - 41 U/L _2 ALT 0 - 44 U/L _3 Lab Results  Component Value Date   WBC 5.3 09/29/2020   HGB 11.8 (L) 09/29/2020   HCT 35.7 (L) 09/29/2020   MCV 93.7 09/29/2020   PLT 157 09/29/2020   NEUTROABS 2.0 09/29/2020    ASSESSMENT & PLAN:  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 N0 M0 stage IA: Pathologic stage TII, N0, M0 stage II A., 3.1 cm tumor ER/PR positive HER-2 negative, grade 3, perineural invasion present but no lymphovascular invasion. 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   Plan: 1. Wll request Dr. Lisbeth Renshaw to look at XRT to chest wall 2. Continue anastrozole (tolerating it well)  Osteoporosis: On Boniva  RTC in 4 months    No orders of the defined types were placed in this encounter.  The patient has a good understanding of the overall plan. she agrees with it. she will call with any problems that may develop before the next visit here.  Total time spent: 14 mins including face to face time and time spent for planning, charting and coordination of care  Rulon Eisenmenger, MD, MPH 11/11/2020  I, Cloyde Reams Dorshimer, am acting as scribe for Dr. Nicholas Lose.  I have reviewed  the above documentation for accuracy and completeness, and I agree with the above.

## 2020-11-11 ENCOUNTER — Inpatient Hospital Stay: Payer: Medicare Other | Attending: Hematology and Oncology | Admitting: Hematology and Oncology

## 2020-11-11 DIAGNOSIS — C50211 Malignant neoplasm of upper-inner quadrant of right female breast: Secondary | ICD-10-CM | POA: Diagnosis not present

## 2020-11-11 DIAGNOSIS — Z170 Estrogen receptor positive status [ER+]: Secondary | ICD-10-CM | POA: Diagnosis not present

## 2020-11-11 NOTE — Assessment & Plan Note (Signed)
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 N0 M0 stage IA: Pathologic stage TII, N0, M0 stage II A., 3.1 cm tumor ER/PR positive HER-2 negative, grade 3, perineural invasion present but no lymphovascular invasion. 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   Plan: 1. Wll request Dr. Lisbeth Renshaw to look at XRT to chest wall 2. Continue anastrozole (tolerating it well)  Osteoporosis: On Boniva  RTC in 4 months

## 2020-11-12 ENCOUNTER — Telehealth: Payer: Self-pay | Admitting: Hematology and Oncology

## 2020-11-12 NOTE — Telephone Encounter (Signed)
Scheduled per 2/8 los. Called and spoke with pt, confirmed 6/8 appt

## 2020-11-17 NOTE — Progress Notes (Signed)
New Breast Cancer Diagnosis: Right  Chest wall- previous right breast IDC ° °Did patient present with symptoms (if so, please note symptoms) or screening mammography?:Palpable mass. ° °In November 2021 she was noted to have a right chest wall lesion of the skin with some slightly palpable nodularity.  ° °Location and Extent of disease : right breast. Located at the right chest wall. ° °Histology per Pathology Report: , Invasive Ductal Carcinoma 10/07/2020 ° °Receptor Status: ER(), PR (), Her2-neu (), Ki-() ° °Surgeon and surgical plan, if any: °Dr. Cornett °-Resection of right chest mass- poorly differentiated carcinoma, with positive inferior and deep margins. 10/07/20 °-Right Mastectomy for recurrence 2015 °-Right Lumpectomy 1993 ° °Medical oncologist, treatment if any:   °Dr. Gudena 11/11/2020 °Plan: °1. Wll request Dr. Moody to look at XRT to chest wall °2. Continue anastrozole (tolerating it well) °-Adjuvant Tamoxifen started November 2015. °- Switched to anastrozole after resection of recurrence. 10/2020. ° °Lymphedema issues, if any:  No ° °Pain issues, if any: No ° °SAFETY ISSUES: °Prior radiation? Right Breast lumpectomy with radiation 1993 33 treatments. °Pacemaker/ICD? No °Possible current pregnancy? Postmenopausal °Is the patient on methotrexate? No ° °Current Complaints / other details:   ° °

## 2020-11-18 ENCOUNTER — Ambulatory Visit
Admission: RE | Admit: 2020-11-18 | Discharge: 2020-11-18 | Disposition: A | Discharge status: Home / Self Care | Payer: Medicare Other | Source: Ambulatory Visit | Attending: Radiation Oncology | Admitting: Radiation Oncology

## 2020-11-18 ENCOUNTER — Other Ambulatory Visit: Payer: Self-pay

## 2020-11-18 ENCOUNTER — Encounter: Payer: Self-pay | Admitting: Radiation Oncology

## 2020-11-18 VITALS — BP 139/60 | HR 58 | Temp 97.2°F | Resp 18 | Ht 65.0 in | Wt 138.5 lb

## 2020-11-18 DIAGNOSIS — F419 Anxiety disorder, unspecified: Secondary | ICD-10-CM | POA: Insufficient documentation

## 2020-11-18 DIAGNOSIS — M810 Age-related osteoporosis without current pathological fracture: Secondary | ICD-10-CM | POA: Diagnosis not present

## 2020-11-18 DIAGNOSIS — Z803 Family history of malignant neoplasm of breast: Secondary | ICD-10-CM | POA: Insufficient documentation

## 2020-11-18 DIAGNOSIS — Z923 Personal history of irradiation: Secondary | ICD-10-CM | POA: Insufficient documentation

## 2020-11-18 DIAGNOSIS — E079 Disorder of thyroid, unspecified: Secondary | ICD-10-CM | POA: Insufficient documentation

## 2020-11-18 DIAGNOSIS — Z170 Estrogen receptor positive status [ER+]: Secondary | ICD-10-CM | POA: Insufficient documentation

## 2020-11-18 DIAGNOSIS — C792 Secondary malignant neoplasm of skin: Secondary | ICD-10-CM | POA: Insufficient documentation

## 2020-11-18 DIAGNOSIS — Z79899 Other long term (current) drug therapy: Secondary | ICD-10-CM | POA: Insufficient documentation

## 2020-11-18 DIAGNOSIS — Z87442 Personal history of urinary calculi: Secondary | ICD-10-CM | POA: Insufficient documentation

## 2020-11-18 DIAGNOSIS — Z79811 Long term (current) use of aromatase inhibitors: Secondary | ICD-10-CM | POA: Diagnosis not present

## 2020-11-18 DIAGNOSIS — C50211 Malignant neoplasm of upper-inner quadrant of right female breast: Secondary | ICD-10-CM | POA: Diagnosis not present

## 2020-11-18 DIAGNOSIS — M129 Arthropathy, unspecified: Secondary | ICD-10-CM | POA: Insufficient documentation

## 2020-11-18 DIAGNOSIS — Z8051 Family history of malignant neoplasm of kidney: Secondary | ICD-10-CM | POA: Diagnosis not present

## 2020-11-18 NOTE — Progress Notes (Signed)
Radiation Oncology         [phone]) [phone] ________________________________  Name: Lydia Roberts        MRN: [id number]  Date of Service: 11/18/2020 DOB: 1946/08/23  [id number], Hinton Dyer, DO  Nicholas Lose, MD     REFERRING PHYSICIAN: Nicholas Lose, MD   DIAGNOSIS: The encounter diagnosis was Malignant neoplasm of upper-inner quadrant of right breast in female, estrogen receptor positive ([place]).   HISTORY OF PRESENT ILLNESS: Lydia Roberts is a 75 y.o. female with a history of a right right breast cancer treated with lumpectomy and radiation in 1993. 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 No lymphovascular space invasion was identified, her margins were negative but there was perineural invasion noted, no nodes were sampled. 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 On 10/07/2020 resection of this revealed poorly differentiated carcinoma with positive inferior and deep margins, she continues now on anastrozole. She is seen today to discuss options of reirradiation to the chest wall.     PREVIOUS RADIATION THERAPY: Yes   1993: Details are no longer available but she was treated with adjuvant radiotherapy following lumpectomy.   PAST MEDICAL HISTORY:  Past Medical History:  Diagnosis Date  . Anxiety   . Arthritis   . Breast cancer ([place]) 1993  . Cancer ([place])   . Osteoporosis   . Recurrent UTI   . Thyroid disease        PAST SURGICAL HISTORY: Past Surgical History:  Procedure Laterality Date  . BREAST SURGERY  1993   rt lump-axillary  node dissection  . COLONOSCOPY    . MASS EXCISION Right 10/07/2020   Procedure: EXCISION RIGHT CHEST WALL MASS;  Surgeon: Erroll Luna, MD;  Location: [facility];  Service: General;  Laterality: Right;  . SIMPLE MASTECTOMY WITH AXILLARY SENTINEL NODE BIOPSY Right 07/25/2014   Procedure: RIGHT SIMPLE MASTECTOMY;  Surgeon: Erroll Luna, MD;  Location: [facility];  Service: General;  Laterality: Right;  . TONSILLECTOMY    . TUBAL LIGATION       FAMILY HISTORY:  Family History  Problem Relation Age of Onset  . Diabetes Sister   . Diabetes Brother   . Diabetes Brother   . Thyroid disease Daughter   . Kidney cancer Father 26       deceased 44  . Breast cancer Paternal Aunt        Dx 42s; died at 19; Had 7 sisters who are cancer-free  . Breast cancer Cousin      SOCIAL HISTORY:  reports that she has never smoked. She has never used smokeless tobacco. She reports that she does not drink alcohol and does not use drugs. The patient is married and lives in [place].   ALLERGIES: Elemental sulfur and Codeine   MEDICATIONS:  Current Outpatient Medications  Medication Sig Dispense Refill  . anastrozole (ARIMIDEX) 1 MG tablet Take 1 tablet (1 mg total) by mouth daily. 90 tablet 0  . cholecalciferol (VITAMIN D) 1000 UNITS tablet Take 1,000 Units by mouth daily.    Marland Kitchen ezetimibe (  ZETIA) 10 MG tablet Take 10 mg by mouth daily.    Marland Kitchen ibandronate (BONIVA) 150 MG tablet Take 1 tablet (150 mg total) by mouth every 30 (thirty) days. Take in the morning with a full glass of water, on an empty stomach, and do not take anything else by mouth or lie down for the next 30 min. 3 tablet 3  . ibuprofen (ADVIL) 800 MG tablet Take 1 tablet (800 mg total) by mouth every 8 (eight) hours as needed. 30 tablet 0  . levothyroxine (SYNTHROID, LEVOTHROID) 125 MCG tablet Take 100 mcg by mouth daily.     No current facility-administered medications for this encounter.     REVIEW OF  SYSTEMS: On review of systems, the patient reports that she is doing well overall. She denies any chest pain, shortness of breath, cough, fevers, chills, night sweats, unintended weight changes. She denies any bowel or bladder disturbances, and denies abdominal pain, nausea or vomiting. She denies any new musculoskeletal or joint aches or pains. A complete review of systems is obtained and is otherwise negative.     PHYSICAL EXAM:  Wt Readings from Last 3 Encounters:  10/07/20 134 lb 4.2 oz (60.9 kg)  09/05/20 133 lb 14.4 oz (60.7 kg)  08/11/20 133 lb 9.6 oz (60.6 kg)   Temp Readings from Last 3 Encounters:  10/07/20 97.8 F (36.6 C)  09/05/20 (!) 97.1 F (36.2 C) (Tympanic)  08/11/20 (!) 96.3 F (35.7 C) (Tympanic)   BP Readings from Last 3 Encounters:  10/07/20 (!) 142/67  09/05/20 (!) 153/66  08/11/20 130/64   Pulse Readings from Last 3 Encounters:  10/07/20 94  09/05/20 61  08/11/20 61    In general this is a well appearing caucasian female in no acute distress. She's alert and oriented x4 and appropriate throughout the examination. Cardiopulmonary assessment is negative for acute distress and she exhibits normal effort. Bilateral breast/chest wall exam is deferred.    ECOG = 0  0 - Asymptomatic (Fully active, able to carry on all predisease activities without restriction)  1 - Symptomatic but completely ambulatory (Restricted in physically strenuous activity but ambulatory and able to carry out work of a light or sedentary nature. For example, light housework, office work)  2 - Symptomatic, <50% in bed during the day (Ambulatory and capable of all self care but unable to carry out any work activities. Up and about more than 50% of waking hours)  3 - Symptomatic, >50% in bed, but not bedbound (Capable of only limited self-care, confined to bed or chair 50% or more of waking hours)  4 - Bedbound (Completely disabled. Cannot carry on any self-care. Totally confined to bed  or chair)  5 - Death   Eustace Pen MM, Creech RH, Tormey DC, et al. [phone]). "Toxicity and response criteria of the [facility]". [place] Oncol. 5 (6): 649-55    LABORATORY DATA:  Lab Results  Component Value Date   WBC 5.3 09/29/2020   HGB 11.8 (L) 09/29/2020   HCT 35.7 (L) 09/29/2020   MCV 93.7 09/29/2020   PLT 157 09/29/2020   Lab Results  Component Value Date   NA 138 09/29/2020   K 4.9 09/29/2020   CL 104 09/29/2020   CO2 27 09/29/2020   Lab Results  Component Value Date   ALT 14 09/29/2020   AST 21 09/29/2020   ALKPHOS 39 09/29/2020   BILITOT 0.7 09/29/2020      RADIOGRAPHY: No results found.  IMPRESSION/PLAN: 1. Locally recurrent progressive Stage IIA, pT2N0M0 ER/PR positive invasive ductal carcinoma of the right chest wall with prior DCIS of the same breast. We discussed the pathology findings and reviewed the nature of locally recurrent breast disease in a previously radiated field. The consensus from the breast conference includes consideration of re-irradiation to now include the chest wall. We discussed the risks, benefits, short, and long term effects of radiotherapy, as well as the curative intent, and the patient is interested in proceeding. Dr. Lisbeth Renshaw discussed the delivery and logistics of radiotherapy and anticipates a course of 5 1/2-6 1/2 weeks of radiotherapy to the right chest wall alone without the need for nodal irradiation. She will simulate on Monday next week. Written consent is obtained and placed in the chart, a copy was provided to the patient.    In a visit lasting 60 minutes, greater than 50% of the time was spent face to face and myself on Webex reviewing her case, as well as in preparation of, discussing, and coordinating the patient's care.    The above documentation reflects my direct findings during this shared patient visit. Please see the separate note by Dr. Lisbeth Renshaw on this date for the remainder of the patient's  plan of care.     Carola Rhine, [facility]     **Disclaimer: This note was dictated with voice recognition software. Similar sounding words can inadvertently be transcribed and this note may contain transcription errors which may not have been corrected upon publication of note.**

## 2020-11-24 ENCOUNTER — Other Ambulatory Visit: Payer: Self-pay

## 2020-11-24 ENCOUNTER — Other Ambulatory Visit: Payer: Self-pay | Admitting: Radiation Oncology

## 2020-11-24 ENCOUNTER — Ambulatory Visit: Admission: RE | Admit: 2020-11-24 | Payer: Medicare Other | Source: Ambulatory Visit | Admitting: Radiation Oncology

## 2020-11-24 MED ORDER — LORAZEPAM 0.5 MG PO TABS: ORAL_TABLET | ORAL | 0 refills | Status: DC

## 2020-11-26 ENCOUNTER — Other Ambulatory Visit: Payer: Self-pay

## 2020-11-26 ENCOUNTER — Ambulatory Visit
Admission: RE | Admit: 2020-11-26 | Discharge: 2020-11-26 | Disposition: A | Discharge status: Home / Self Care | Payer: Medicare Other | Source: Ambulatory Visit | Attending: Radiation Oncology | Admitting: Radiation Oncology

## 2020-11-26 DIAGNOSIS — C7989 Secondary malignant neoplasm of other specified sites: Secondary | ICD-10-CM | POA: Diagnosis not present

## 2020-11-26 DIAGNOSIS — C50211 Malignant neoplasm of upper-inner quadrant of right female breast: Secondary | ICD-10-CM | POA: Insufficient documentation

## 2020-11-26 DIAGNOSIS — Z170 Estrogen receptor positive status [ER+]: Secondary | ICD-10-CM | POA: Insufficient documentation

## 2020-11-26 DIAGNOSIS — Z9011 Acquired absence of right breast and nipple: Secondary | ICD-10-CM | POA: Diagnosis not present

## 2020-12-01 ENCOUNTER — Ambulatory Visit: Payer: Medicare Other | Admitting: Radiation Oncology

## 2020-12-02 ENCOUNTER — Ambulatory Visit: Payer: Medicare Other

## 2020-12-02 DIAGNOSIS — Z170 Estrogen receptor positive status [ER+]: Secondary | ICD-10-CM | POA: Diagnosis not present

## 2020-12-02 DIAGNOSIS — C50211 Malignant neoplasm of upper-inner quadrant of right female breast: Secondary | ICD-10-CM | POA: Insufficient documentation

## 2020-12-03 ENCOUNTER — Other Ambulatory Visit: Payer: Self-pay

## 2020-12-03 ENCOUNTER — Ambulatory Visit
Admission: RE | Admit: 2020-12-03 | Discharge: 2020-12-03 | Disposition: A | Discharge status: Home / Self Care | Payer: Medicare Other | Source: Ambulatory Visit | Attending: Radiation Oncology | Admitting: Radiation Oncology

## 2020-12-03 DIAGNOSIS — Z170 Estrogen receptor positive status [ER+]: Secondary | ICD-10-CM | POA: Diagnosis not present

## 2020-12-03 DIAGNOSIS — C50211 Malignant neoplasm of upper-inner quadrant of right female breast: Secondary | ICD-10-CM | POA: Diagnosis not present

## 2020-12-04 ENCOUNTER — Other Ambulatory Visit: Payer: Self-pay

## 2020-12-04 ENCOUNTER — Ambulatory Visit
Admission: RE | Admit: 2020-12-04 | Discharge: 2020-12-04 | Disposition: A | Discharge status: Home / Self Care | Payer: Medicare Other | Source: Ambulatory Visit | Attending: Radiation Oncology | Admitting: Radiation Oncology

## 2020-12-04 DIAGNOSIS — Z170 Estrogen receptor positive status [ER+]: Secondary | ICD-10-CM | POA: Diagnosis not present

## 2020-12-04 DIAGNOSIS — C50211 Malignant neoplasm of upper-inner quadrant of right female breast: Secondary | ICD-10-CM | POA: Diagnosis not present

## 2020-12-05 ENCOUNTER — Ambulatory Visit
Admission: RE | Admit: 2020-12-05 | Discharge: 2020-12-05 | Disposition: A | Discharge status: Home / Self Care | Payer: Medicare Other | Source: Ambulatory Visit | Attending: Radiation Oncology | Admitting: Radiation Oncology

## 2020-12-05 DIAGNOSIS — C50211 Malignant neoplasm of upper-inner quadrant of right female breast: Secondary | ICD-10-CM | POA: Diagnosis not present

## 2020-12-05 DIAGNOSIS — Z170 Estrogen receptor positive status [ER+]: Secondary | ICD-10-CM | POA: Diagnosis not present

## 2020-12-05 MED ORDER — RADIAPLEXRX EX GEL: Freq: Once | CUTANEOUS | Status: AC

## 2020-12-05 NOTE — Progress Notes (Signed)
Pt here for patient teaching.  Pt given Radiation and You booklet, skin care instructions, Alra deodorant and Radiaplex gel.  Reviewed areas of pertinence such as fatigue and skin changes . Pt able to give teach back of to pat skin and use unscented/gentle soap,apply Radiaplex bid, avoid applying anything to skin within 4 hours of treatment, avoid wearing an under wire bra and to use an electric razor if they must shave. Pt demonstrated understanding of information given and will contact nursing with any questions or concerns.     [email or web address]

## 2020-12-08 ENCOUNTER — Ambulatory Visit
Admission: RE | Admit: 2020-12-08 | Discharge: 2020-12-08 | Disposition: A | Discharge status: Home / Self Care | Payer: Medicare Other | Source: Ambulatory Visit | Attending: Radiation Oncology | Admitting: Radiation Oncology

## 2020-12-08 DIAGNOSIS — C50211 Malignant neoplasm of upper-inner quadrant of right female breast: Secondary | ICD-10-CM | POA: Diagnosis not present

## 2020-12-08 DIAGNOSIS — Z170 Estrogen receptor positive status [ER+]: Secondary | ICD-10-CM | POA: Diagnosis not present

## 2020-12-09 ENCOUNTER — Ambulatory Visit
Admission: RE | Admit: 2020-12-09 | Discharge: 2020-12-09 | Disposition: A | Discharge status: Home / Self Care | Payer: Medicare Other | Source: Ambulatory Visit | Attending: Radiation Oncology | Admitting: Radiation Oncology

## 2020-12-09 DIAGNOSIS — Z170 Estrogen receptor positive status [ER+]: Secondary | ICD-10-CM | POA: Diagnosis not present

## 2020-12-09 DIAGNOSIS — C50211 Malignant neoplasm of upper-inner quadrant of right female breast: Secondary | ICD-10-CM | POA: Diagnosis not present

## 2020-12-10 ENCOUNTER — Ambulatory Visit
Admission: RE | Admit: 2020-12-10 | Discharge: 2020-12-10 | Disposition: A | Discharge status: Home / Self Care | Payer: Medicare Other | Source: Ambulatory Visit | Attending: Radiation Oncology | Admitting: Radiation Oncology

## 2020-12-10 ENCOUNTER — Other Ambulatory Visit: Payer: Self-pay | Admitting: Radiation Oncology

## 2020-12-10 ENCOUNTER — Encounter: Payer: Self-pay | Admitting: Hematology and Oncology

## 2020-12-10 DIAGNOSIS — Z170 Estrogen receptor positive status [ER+]: Secondary | ICD-10-CM | POA: Diagnosis not present

## 2020-12-10 DIAGNOSIS — C50211 Malignant neoplasm of upper-inner quadrant of right female breast: Secondary | ICD-10-CM | POA: Diagnosis not present

## 2020-12-10 MED ORDER — LORAZEPAM 0.5 MG PO TABS: ORAL_TABLET | ORAL | 0 refills | Status: DC

## 2020-12-11 ENCOUNTER — Ambulatory Visit
Admission: RE | Admit: 2020-12-11 | Discharge: 2020-12-11 | Disposition: A | Discharge status: Home / Self Care | Payer: Medicare Other | Source: Ambulatory Visit | Attending: Radiation Oncology | Admitting: Radiation Oncology

## 2020-12-11 DIAGNOSIS — C50211 Malignant neoplasm of upper-inner quadrant of right female breast: Secondary | ICD-10-CM | POA: Diagnosis not present

## 2020-12-11 DIAGNOSIS — Z170 Estrogen receptor positive status [ER+]: Secondary | ICD-10-CM | POA: Diagnosis not present

## 2020-12-12 ENCOUNTER — Ambulatory Visit
Admission: RE | Admit: 2020-12-12 | Discharge: 2020-12-12 | Disposition: A | Discharge status: Home / Self Care | Payer: Medicare Other | Source: Ambulatory Visit | Attending: Radiation Oncology | Admitting: Radiation Oncology

## 2020-12-12 DIAGNOSIS — C50211 Malignant neoplasm of upper-inner quadrant of right female breast: Secondary | ICD-10-CM | POA: Diagnosis not present

## 2020-12-12 DIAGNOSIS — Z170 Estrogen receptor positive status [ER+]: Secondary | ICD-10-CM | POA: Diagnosis not present

## 2020-12-15 ENCOUNTER — Ambulatory Visit
Admission: RE | Admit: 2020-12-15 | Discharge: 2020-12-15 | Disposition: A | Discharge status: Home / Self Care | Payer: Medicare Other | Source: Ambulatory Visit | Attending: Radiation Oncology | Admitting: Radiation Oncology

## 2020-12-15 DIAGNOSIS — C50211 Malignant neoplasm of upper-inner quadrant of right female breast: Secondary | ICD-10-CM | POA: Diagnosis not present

## 2020-12-15 DIAGNOSIS — Z170 Estrogen receptor positive status [ER+]: Secondary | ICD-10-CM | POA: Diagnosis not present

## 2020-12-16 ENCOUNTER — Other Ambulatory Visit: Payer: Self-pay

## 2020-12-16 ENCOUNTER — Ambulatory Visit
Admission: RE | Admit: 2020-12-16 | Discharge: 2020-12-16 | Disposition: A | Discharge status: Home / Self Care | Payer: Medicare Other | Source: Ambulatory Visit | Attending: Radiation Oncology | Admitting: Radiation Oncology

## 2020-12-16 DIAGNOSIS — C50211 Malignant neoplasm of upper-inner quadrant of right female breast: Secondary | ICD-10-CM | POA: Diagnosis not present

## 2020-12-16 DIAGNOSIS — Z170 Estrogen receptor positive status [ER+]: Secondary | ICD-10-CM | POA: Diagnosis not present

## 2020-12-17 ENCOUNTER — Ambulatory Visit
Admission: RE | Admit: 2020-12-17 | Discharge: 2020-12-17 | Disposition: A | Discharge status: Home / Self Care | Payer: Medicare Other | Source: Ambulatory Visit | Attending: Radiation Oncology | Admitting: Radiation Oncology

## 2020-12-17 DIAGNOSIS — Z170 Estrogen receptor positive status [ER+]: Secondary | ICD-10-CM | POA: Diagnosis not present

## 2020-12-17 DIAGNOSIS — C50211 Malignant neoplasm of upper-inner quadrant of right female breast: Secondary | ICD-10-CM | POA: Diagnosis not present

## 2020-12-18 ENCOUNTER — Ambulatory Visit
Admission: RE | Admit: 2020-12-18 | Discharge: 2020-12-18 | Disposition: A | Discharge status: Home / Self Care | Payer: Medicare Other | Source: Ambulatory Visit | Attending: Radiation Oncology | Admitting: Radiation Oncology

## 2020-12-18 ENCOUNTER — Other Ambulatory Visit: Payer: Self-pay

## 2020-12-18 DIAGNOSIS — Z170 Estrogen receptor positive status [ER+]: Secondary | ICD-10-CM | POA: Diagnosis not present

## 2020-12-18 DIAGNOSIS — C50211 Malignant neoplasm of upper-inner quadrant of right female breast: Secondary | ICD-10-CM | POA: Diagnosis not present

## 2020-12-19 ENCOUNTER — Other Ambulatory Visit: Payer: Self-pay

## 2020-12-19 ENCOUNTER — Ambulatory Visit
Admission: RE | Admit: 2020-12-19 | Discharge: 2020-12-19 | Disposition: A | Discharge status: Home / Self Care | Payer: Medicare Other | Source: Ambulatory Visit | Attending: Radiation Oncology | Admitting: Radiation Oncology

## 2020-12-19 DIAGNOSIS — Z170 Estrogen receptor positive status [ER+]: Secondary | ICD-10-CM | POA: Diagnosis not present

## 2020-12-19 DIAGNOSIS — C50211 Malignant neoplasm of upper-inner quadrant of right female breast: Secondary | ICD-10-CM | POA: Diagnosis not present

## 2020-12-22 ENCOUNTER — Other Ambulatory Visit: Payer: Self-pay

## 2020-12-22 ENCOUNTER — Other Ambulatory Visit: Payer: Self-pay | Admitting: Radiation Oncology

## 2020-12-22 ENCOUNTER — Ambulatory Visit
Admission: RE | Admit: 2020-12-22 | Discharge: 2020-12-22 | Disposition: A | Discharge status: Home / Self Care | Payer: Medicare Other | Source: Ambulatory Visit | Attending: Radiation Oncology | Admitting: Radiation Oncology

## 2020-12-22 DIAGNOSIS — Z170 Estrogen receptor positive status [ER+]: Secondary | ICD-10-CM | POA: Diagnosis not present

## 2020-12-22 DIAGNOSIS — C50211 Malignant neoplasm of upper-inner quadrant of right female breast: Secondary | ICD-10-CM | POA: Diagnosis not present

## 2020-12-23 ENCOUNTER — Ambulatory Visit
Admission: RE | Admit: 2020-12-23 | Discharge: 2020-12-23 | Disposition: A | Discharge status: Home / Self Care | Payer: Medicare Other | Source: Ambulatory Visit | Attending: Radiation Oncology | Admitting: Radiation Oncology

## 2020-12-23 DIAGNOSIS — C50211 Malignant neoplasm of upper-inner quadrant of right female breast: Secondary | ICD-10-CM | POA: Diagnosis not present

## 2020-12-23 DIAGNOSIS — Z170 Estrogen receptor positive status [ER+]: Secondary | ICD-10-CM | POA: Diagnosis not present

## 2020-12-24 ENCOUNTER — Ambulatory Visit: Payer: Medicare Other

## 2020-12-25 ENCOUNTER — Other Ambulatory Visit: Payer: Self-pay

## 2020-12-25 ENCOUNTER — Ambulatory Visit
Admission: RE | Admit: 2020-12-25 | Discharge: 2020-12-25 | Disposition: A | Discharge status: Home / Self Care | Payer: Medicare Other | Source: Ambulatory Visit | Attending: Radiation Oncology | Admitting: Radiation Oncology

## 2020-12-25 DIAGNOSIS — Z170 Estrogen receptor positive status [ER+]: Secondary | ICD-10-CM | POA: Diagnosis not present

## 2020-12-25 DIAGNOSIS — C50211 Malignant neoplasm of upper-inner quadrant of right female breast: Secondary | ICD-10-CM | POA: Diagnosis not present

## 2020-12-26 ENCOUNTER — Ambulatory Visit
Admission: RE | Admit: 2020-12-26 | Discharge: 2020-12-26 | Disposition: A | Discharge status: Home / Self Care | Payer: Medicare Other | Source: Ambulatory Visit | Attending: Radiation Oncology | Admitting: Radiation Oncology

## 2020-12-26 DIAGNOSIS — C50211 Malignant neoplasm of upper-inner quadrant of right female breast: Secondary | ICD-10-CM | POA: Diagnosis not present

## 2020-12-26 DIAGNOSIS — Z170 Estrogen receptor positive status [ER+]: Secondary | ICD-10-CM | POA: Diagnosis not present

## 2020-12-29 ENCOUNTER — Other Ambulatory Visit: Payer: Self-pay

## 2020-12-29 ENCOUNTER — Ambulatory Visit
Admission: RE | Admit: 2020-12-29 | Discharge: 2020-12-29 | Disposition: A | Discharge status: Home / Self Care | Payer: Medicare Other | Source: Ambulatory Visit | Attending: Radiation Oncology | Admitting: Radiation Oncology

## 2020-12-29 DIAGNOSIS — Z170 Estrogen receptor positive status [ER+]: Secondary | ICD-10-CM | POA: Diagnosis not present

## 2020-12-29 DIAGNOSIS — C50211 Malignant neoplasm of upper-inner quadrant of right female breast: Secondary | ICD-10-CM | POA: Diagnosis not present

## 2020-12-30 ENCOUNTER — Ambulatory Visit: Admission: RE | Admit: 2020-12-30 | Payer: Medicare Other | Source: Ambulatory Visit

## 2020-12-30 ENCOUNTER — Other Ambulatory Visit: Payer: Self-pay

## 2020-12-31 ENCOUNTER — Ambulatory Visit
Admission: RE | Admit: 2020-12-31 | Discharge: 2020-12-31 | Disposition: A | Discharge status: Home / Self Care | Payer: Medicare Other | Source: Ambulatory Visit | Attending: Radiation Oncology | Admitting: Radiation Oncology

## 2020-12-31 DIAGNOSIS — C50211 Malignant neoplasm of upper-inner quadrant of right female breast: Secondary | ICD-10-CM | POA: Diagnosis not present

## 2020-12-31 DIAGNOSIS — Z170 Estrogen receptor positive status [ER+]: Secondary | ICD-10-CM | POA: Diagnosis not present

## 2021-01-01 ENCOUNTER — Other Ambulatory Visit: Payer: Self-pay

## 2021-01-01 ENCOUNTER — Ambulatory Visit
Admission: RE | Admit: 2021-01-01 | Discharge: 2021-01-01 | Disposition: A | Discharge status: Home / Self Care | Payer: Medicare Other | Source: Ambulatory Visit | Attending: Radiation Oncology | Admitting: Radiation Oncology

## 2021-01-01 DIAGNOSIS — Z170 Estrogen receptor positive status [ER+]: Secondary | ICD-10-CM | POA: Diagnosis not present

## 2021-01-01 DIAGNOSIS — C50211 Malignant neoplasm of upper-inner quadrant of right female breast: Secondary | ICD-10-CM | POA: Diagnosis not present

## 2021-01-02 ENCOUNTER — Other Ambulatory Visit: Payer: Self-pay

## 2021-01-02 ENCOUNTER — Ambulatory Visit
Admission: RE | Admit: 2021-01-02 | Discharge: 2021-01-02 | Disposition: A | Discharge status: Home / Self Care | Payer: Medicare Other | Source: Ambulatory Visit | Attending: Radiation Oncology | Admitting: Radiation Oncology

## 2021-01-02 DIAGNOSIS — Z170 Estrogen receptor positive status [ER+]: Secondary | ICD-10-CM | POA: Insufficient documentation

## 2021-01-02 DIAGNOSIS — C50211 Malignant neoplasm of upper-inner quadrant of right female breast: Secondary | ICD-10-CM | POA: Insufficient documentation

## 2021-01-05 ENCOUNTER — Ambulatory Visit
Admission: RE | Admit: 2021-01-05 | Discharge: 2021-01-05 | Disposition: A | Discharge status: Home / Self Care | Payer: Medicare Other | Source: Ambulatory Visit | Attending: Radiation Oncology | Admitting: Radiation Oncology

## 2021-01-05 ENCOUNTER — Other Ambulatory Visit: Payer: Self-pay

## 2021-01-05 DIAGNOSIS — Z170 Estrogen receptor positive status [ER+]: Secondary | ICD-10-CM | POA: Diagnosis not present

## 2021-01-05 DIAGNOSIS — C50211 Malignant neoplasm of upper-inner quadrant of right female breast: Secondary | ICD-10-CM | POA: Diagnosis not present

## 2021-01-06 ENCOUNTER — Ambulatory Visit
Admission: RE | Admit: 2021-01-06 | Discharge: 2021-01-06 | Disposition: A | Discharge status: Home / Self Care | Payer: Medicare Other | Source: Ambulatory Visit | Attending: Radiation Oncology | Admitting: Radiation Oncology

## 2021-01-06 DIAGNOSIS — C50211 Malignant neoplasm of upper-inner quadrant of right female breast: Secondary | ICD-10-CM | POA: Diagnosis not present

## 2021-01-06 DIAGNOSIS — Z170 Estrogen receptor positive status [ER+]: Secondary | ICD-10-CM | POA: Diagnosis not present

## 2021-01-07 ENCOUNTER — Ambulatory Visit: Payer: Medicare Other

## 2021-01-07 ENCOUNTER — Ambulatory Visit
Admission: RE | Admit: 2021-01-07 | Discharge: 2021-01-07 | Disposition: A | Discharge status: Home / Self Care | Payer: Medicare Other | Source: Ambulatory Visit | Attending: Radiation Oncology | Admitting: Radiation Oncology

## 2021-01-07 DIAGNOSIS — C50211 Malignant neoplasm of upper-inner quadrant of right female breast: Secondary | ICD-10-CM | POA: Diagnosis not present

## 2021-01-07 DIAGNOSIS — Z170 Estrogen receptor positive status [ER+]: Secondary | ICD-10-CM | POA: Diagnosis not present

## 2021-01-08 ENCOUNTER — Ambulatory Visit
Admission: RE | Admit: 2021-01-08 | Discharge: 2021-01-08 | Disposition: A | Discharge status: Home / Self Care | Payer: Medicare Other | Source: Ambulatory Visit | Attending: Radiation Oncology | Admitting: Radiation Oncology

## 2021-01-08 DIAGNOSIS — Z170 Estrogen receptor positive status [ER+]: Secondary | ICD-10-CM | POA: Diagnosis not present

## 2021-01-08 DIAGNOSIS — C50211 Malignant neoplasm of upper-inner quadrant of right female breast: Secondary | ICD-10-CM | POA: Diagnosis not present

## 2021-01-09 ENCOUNTER — Other Ambulatory Visit: Payer: Self-pay | Admitting: Hematology and Oncology

## 2021-01-09 ENCOUNTER — Ambulatory Visit
Admission: RE | Admit: 2021-01-09 | Discharge: 2021-01-09 | Disposition: A | Discharge status: Home / Self Care | Payer: Medicare Other | Source: Ambulatory Visit | Attending: Radiation Oncology | Admitting: Radiation Oncology

## 2021-01-09 ENCOUNTER — Ambulatory Visit: Payer: Medicare Other

## 2021-01-09 ENCOUNTER — Other Ambulatory Visit: Payer: Self-pay

## 2021-01-09 DIAGNOSIS — C50211 Malignant neoplasm of upper-inner quadrant of right female breast: Secondary | ICD-10-CM | POA: Diagnosis not present

## 2021-01-09 DIAGNOSIS — Z170 Estrogen receptor positive status [ER+]: Secondary | ICD-10-CM | POA: Diagnosis not present

## 2021-01-12 ENCOUNTER — Other Ambulatory Visit: Payer: Self-pay

## 2021-01-12 ENCOUNTER — Ambulatory Visit
Admission: RE | Admit: 2021-01-12 | Discharge: 2021-01-12 | Disposition: A | Discharge status: Home / Self Care | Payer: Medicare Other | Source: Ambulatory Visit | Attending: Radiation Oncology | Admitting: Radiation Oncology

## 2021-01-12 DIAGNOSIS — C50211 Malignant neoplasm of upper-inner quadrant of right female breast: Secondary | ICD-10-CM | POA: Diagnosis not present

## 2021-01-12 DIAGNOSIS — Z170 Estrogen receptor positive status [ER+]: Secondary | ICD-10-CM | POA: Diagnosis not present

## 2021-01-13 ENCOUNTER — Ambulatory Visit
Admission: RE | Admit: 2021-01-13 | Discharge: 2021-01-13 | Disposition: A | Discharge status: Home / Self Care | Payer: Medicare Other | Source: Ambulatory Visit | Attending: Radiation Oncology | Admitting: Radiation Oncology

## 2021-01-13 ENCOUNTER — Encounter: Payer: Self-pay | Admitting: Radiation Oncology

## 2021-01-13 DIAGNOSIS — Z170 Estrogen receptor positive status [ER+]: Secondary | ICD-10-CM | POA: Diagnosis not present

## 2021-01-13 DIAGNOSIS — C50211 Malignant neoplasm of upper-inner quadrant of right female breast: Secondary | ICD-10-CM | POA: Diagnosis not present

## 2021-01-13 NOTE — Progress Notes (Signed)
                                                                                                                                                               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   Intent: Curative  Radiation Treatment Dates: 12/03/2020 through 01/13/2021 Site Technique Total Dose (Gy) Dose per Fx (Gy) Completed Fx Beam Energies  Chest Wall, Right: CW_Rt 3D 50.4/50.4 1.8 28/28 6X   Narrative: The patient tolerated radiation therapy relatively well. No fatigue or discomfort was reported, but she had expected erythema in the treatment field.   Plan: The patient will receive a call in about one month from the radiation oncology department. She will continue follow up with Dr. Lindi Adie as well.   ________________________________________________    Carola Rhine, [facility]

## 2021-01-14 ENCOUNTER — Ambulatory Visit
Admission: RE | Admit: 2021-01-14 | Discharge: 2021-01-14 | Disposition: A | Discharge status: Home / Self Care | Payer: Medicare Other | Source: Ambulatory Visit | Attending: Hematology and Oncology | Admitting: Hematology and Oncology

## 2021-01-14 ENCOUNTER — Other Ambulatory Visit: Payer: Self-pay

## 2021-01-14 DIAGNOSIS — Z170 Estrogen receptor positive status [ER+]: Secondary | ICD-10-CM

## 2021-01-14 DIAGNOSIS — N6489 Other specified disorders of breast: Secondary | ICD-10-CM | POA: Diagnosis not present

## 2021-01-14 DIAGNOSIS — C50211 Malignant neoplasm of upper-inner quadrant of right female breast: Secondary | ICD-10-CM

## 2021-01-14 MED ORDER — GADOBUTROL 1 MMOL/ML IV SOLN
6.0000 mL | Freq: Once | INTRAVENOUS | Status: AC | PRN
  Administered 2021-01-14: 6 mL via INTRAVENOUS

## 2021-01-20 ENCOUNTER — Telehealth: Payer: Self-pay | Admitting: *Deleted

## 2021-01-20 NOTE — Telephone Encounter (Signed)
Spoke with the patient to let her know that once her tube of radiaplex is gone, she can switch to vitamin E oil or any moisturizer with vitamin E that she likes.  She verbalized understanding.    Lydia Roberts. Leonie Green, BSN

## 2021-01-27 ENCOUNTER — Other Ambulatory Visit: Payer: Self-pay | Admitting: *Deleted

## 2021-01-27 MED ORDER — IBANDRONATE SODIUM 150 MG PO TABS: 150.0000 mg | ORAL_TABLET | ORAL | 3 refills | Status: DC

## 2021-03-10 NOTE — Assessment & Plan Note (Signed)
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 N0 M0 stage IA: Pathologic stage TII, N0, M0 stage II A., 3.1 cm tumor ER/PR positive HER-2 negative, grade 3, perineural invasion present but no lymphovascular invasion. 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  Adj XRT completed 01/13/21  Current Treatment: Anastrozole  Anastrozole Toxicities:  Osteoporosis: On Boniva  RTC in in 1 year

## 2021-03-10 NOTE — Progress Notes (Signed)
Patient Care Team: Janie Morning, DO as PCP - General (Family Medicine) Terrance Mass, MD (Inactive) as Referring Physician (Gynecology) Erroll Luna, MD as Consulting Physician (General Surgery) Nicholas Lose, MD as Consulting Physician (Hematology and Oncology) Kyung Rudd, MD as Consulting Physician (Radiation Oncology)  DIAGNOSIS:    ICD-10-CM   1. Malignant neoplasm of upper-inner quadrant of right breast in female, estrogen receptor positive ([place])  C50.211 MM DIAG BREAST TOMO UNI LEFT   Z17.0     SUMMARY OF ONCOLOGIC HISTORY: Oncology History  Breast cancer of upper-inner quadrant of right female breast ([place])  06/12/2014 Mammogram   Right breast architectural distortion (dense breasts); Ultrasound 1.3 cm mass at 1:00 position additional masses behind this mass measuring 1.3 cm and 0.7 cm. These lumps were palpable   06/13/2014 Initial Diagnosis   Right breast: 1.3cm mass: Invasive mammary cancer probably ductal with mammary ca in situ ER 100% PR 80% gases on 30% HER-2 negative; posterior 1.3 cm mass: IDC ER 100% PR 90% gets some 20% HER-2 negative and 0.7 cm mass: DCIS   07/25/2014 Surgery   Right simple mastectomy: Invasive ductal carcinoma No LVI, Pos for PNI, 3.1 cm grade 3 with DCIS grade 3, ER 100%, PR 100%, HER-2/neu negative Ki-67 20% T2, N0, M0 stage II A., Oncotype DX recurrence over 4, ROR 4%   08/14/2014 - 09/2020 Anti-estrogen oral therapy   Tamoxifen 20 mg daily   08/24/2020 Relapse/Recurrence   Right chest wall postmastectomy skin erythema with slight palpable nodularity. 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 CT CAP and bone scan on 09/03/20 showed no evidence of metastases.   10/07/2020 Surgery   Resection of right chest mass ([place]): poorly differentiated carcinoma, with positive inferior and deep margins.    10/2020 -  Anti-estrogen oral therapy   Switched to  anastrozole after resection of recurrence      CHIEF COMPLIANT: Follow-upof recurrent right breast cancer  INTERVAL HISTORY: Lydia Roberts is a 75 y.o. with above-mentioned history of recurrent right breast cancer currently on anastrozole. Breast MRI on 01/14/21 showed no evidence of malignancy bilaterally. She presents to the clinic today for follow-up.  She is tolerating anastrozole extremely well without any problems or concerns.  Denies any hot flashes or arthralgias or myalgias.  Postsurgical and radiation scars have healed very well.  ALLERGIES:  is allergic to elemental sulfur and codeine.  MEDICATIONS:  Current Outpatient Medications  Medication Sig Dispense Refill  . anastrozole (ARIMIDEX) 1 MG tablet Take 1 tablet (1 mg total) by mouth daily. 90 tablet 3  . cholecalciferol (VITAMIN D) 1000 UNITS tablet Take 1,000 Units by mouth daily.    Marland Kitchen ezetimibe (ZETIA) 10 MG tablet Take 10 mg by mouth daily.    Marland Kitchen ibandronate (BONIVA) 150 MG tablet Take 1 tablet (150 mg total) by mouth every 30 (thirty) days. Take in the morning with a full glass of water, on an empty stomach, and do not take anything else by mouth or lie down for the next 30 min. 3 tablet 3  . levothyroxine (SYNTHROID, LEVOTHROID) 125 MCG tablet Take 100 mcg by mouth daily.    . RESTASIS 0.05 % ophthalmic emulsion      No current facility-administered medications for this visit.    PHYSICAL EXAMINATION: ECOG PERFORMANCE STATUS: 1 - Symptomatic but completely ambulatory  Vitals:   03/11/21 0822  BP: 128/64  Pulse: 64  Resp: 17  Temp: 98  F (36.7 C)  SpO2: 100%   Filed Weights   03/11/21 0822  Weight: 138 lb 8 oz (62.8 kg)       LABORATORY DATA:  I have reviewed the data as listed CMP Latest Ref Rng & Units 09/29/2020 09/02/2020 09/22/2016  Glucose 70 - 99 mg/dL 94 96 127(H)  BUN 8 - 23 mg/dL '12 18 15  ' Creatinine 0.44 - 1.00 mg/dL 1.03(H) 1.10(H) 0.98  Sodium 135 - 145 mmol/L 138 141 136  Potassium 3.5 -  5.1 mmol/L 4.9 4.2 3.7  Chloride 98 - 111 mmol/L 104 107 104  CO2 22 - 32 mmol/L '27 26 22  ' Calcium 8.9 - 10.3 mg/dL 9.1 9.2 8.8(L)  Total Protein 6.5 - 8.1 g/dL 6.7 7.2 6.7  Total Bilirubin 0.3 - 1.2 mg/dL 0.7 1.0 1.6(H)  Alkaline Phos 38 - 126 U/L 39 42 36(L)  AST 15 - 41 U/L '21 20 21  ' ALT 0 - 44 U/L '14 11 14    ' Lab Results  Component Value Date   WBC 5.3 09/29/2020   HGB 11.8 (L) 09/29/2020   HCT 35.7 (L) 09/29/2020   MCV 93.7 09/29/2020   PLT 157 09/29/2020   NEUTROABS 2.0 09/29/2020    ASSESSMENT & PLAN:  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 N0 M0 stage IA: Pathologic stage TII, N0, M0 stage II A., 3.1 cm tumor ER/PR positive HER-2 negative, grade 3, perineural invasion present but no lymphovascular invasion. 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  Adj XRT completed 01/13/21  Current Treatment: Anastrozole  Anastrozole Toxicities: Denies any hot flashes or arthralgias or myalgias.  Osteoporosis: On Boniva and calcium and vitamin D Claustrophobia: Patient has severe anxiety and panic disorder when she is in close spaces.  She was able to finish radiation with the help of lorazepam.  RTC in in 1 year    Orders Placed This Encounter  Procedures  . MM DIAG BREAST TOMO UNI LEFT    Standing Status:   Future    Standing Expiration Date:   03/11/2022    Order Specific Question:   Reason for Exam  (SYMPTOM  OR DIAGNOSIS REQUIRED)    Answer:   Annual mammogram on left breast with H/O right breast cancer    Order Specific Question:   Preferred imaging location?    Answer:   External    Comments:   [facility]    Order Specific Question:   Release to patient    Answer:   Immediate   The patient has a good understanding of the overall plan. she agrees with it. she will call with any problems that may develop before the next visit here.  Total time spent: 20 mins including face to face time and time spent for planning, charting and coordination of care  Rulon Eisenmenger, MD, MPH 03/11/2021  I, Cloyde Reams Dorshimer, am acting as scribe for Dr. Nicholas Lose.  I have reviewed the above documentation for accuracy and completeness, and I agree with the above.

## 2021-03-11 ENCOUNTER — Inpatient Hospital Stay: Payer: Medicare Other | Attending: Hematology and Oncology | Admitting: Hematology and Oncology

## 2021-03-11 ENCOUNTER — Other Ambulatory Visit: Payer: Self-pay

## 2021-03-11 DIAGNOSIS — Z79899 Other long term (current) drug therapy: Secondary | ICD-10-CM | POA: Insufficient documentation

## 2021-03-11 DIAGNOSIS — Z9011 Acquired absence of right breast and nipple: Secondary | ICD-10-CM | POA: Insufficient documentation

## 2021-03-11 DIAGNOSIS — Z923 Personal history of irradiation: Secondary | ICD-10-CM | POA: Diagnosis not present

## 2021-03-11 DIAGNOSIS — Z170 Estrogen receptor positive status [ER+]: Secondary | ICD-10-CM | POA: Diagnosis not present

## 2021-03-11 DIAGNOSIS — C50211 Malignant neoplasm of upper-inner quadrant of right female breast: Secondary | ICD-10-CM | POA: Diagnosis not present

## 2021-03-11 MED ORDER — ANASTROZOLE 1 MG PO TABS: 1.0000 mg | ORAL_TABLET | Freq: Every day | ORAL | 3 refills | Status: DC

## 2021-03-25 ENCOUNTER — Other Ambulatory Visit: Payer: Self-pay | Admitting: Hematology and Oncology

## 2021-03-27 ENCOUNTER — Other Ambulatory Visit: Payer: Self-pay | Admitting: Hematology and Oncology

## 2021-03-30 ENCOUNTER — Other Ambulatory Visit: Payer: Self-pay

## 2021-03-30 ENCOUNTER — Ambulatory Visit
Admission: RE | Admit: 2021-03-30 | Discharge: 2021-03-30 | Disposition: A | Discharge status: Home / Self Care | Payer: Medicare Other | Source: Ambulatory Visit | Attending: Radiation Oncology | Admitting: Radiation Oncology

## 2021-03-30 DIAGNOSIS — C50211 Malignant neoplasm of upper-inner quadrant of right female breast: Secondary | ICD-10-CM | POA: Insufficient documentation

## 2021-03-30 DIAGNOSIS — Z170 Estrogen receptor positive status [ER+]: Secondary | ICD-10-CM | POA: Insufficient documentation

## 2021-03-31 NOTE — Progress Notes (Signed)
  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  Interval Since Last Radiation:  11 weeks   12/03/2020 through 01/13/2021 Site Technique Total Dose (Gy) Dose per Fx (Gy) Completed Fx Beam Energies  Chest Wall, Right: CW_Rt 3D 50.4/50.4 1.8 28/28 6X    Narrative:  The patient was contacted today for routine follow-up. During treatment she did very well with radiotherapy and did not have significant desquamation.    Impression/Plan: 1. 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 I was unable to reach the patient but was able to leave a voicemail and on the message discussed that we would be happy to continue to follow her as needed, but she will also continue to follow up with Dr. Lindi Adie in medical oncology. She was counseled on skin care as well as measures to avoid sun exposure to this area.  2. Survivorship. We discussed the importance of survivorship evaluation and encouraged her to attend her upcoming visit with that clinic.       Carola Rhine, PAC

## 2021-04-21 DIAGNOSIS — E785 Hyperlipidemia, unspecified: Secondary | ICD-10-CM | POA: Diagnosis not present

## 2021-04-21 DIAGNOSIS — E039 Hypothyroidism, unspecified: Secondary | ICD-10-CM | POA: Diagnosis not present

## 2021-04-30 DIAGNOSIS — R7309 Other abnormal glucose: Secondary | ICD-10-CM | POA: Diagnosis not present

## 2021-04-30 DIAGNOSIS — Z5181 Encounter for therapeutic drug level monitoring: Secondary | ICD-10-CM | POA: Diagnosis not present

## 2021-04-30 DIAGNOSIS — E785 Hyperlipidemia, unspecified: Secondary | ICD-10-CM | POA: Diagnosis not present

## 2021-04-30 DIAGNOSIS — C50919 Malignant neoplasm of unspecified site of unspecified female breast: Secondary | ICD-10-CM | POA: Diagnosis not present

## 2021-04-30 DIAGNOSIS — E559 Vitamin D deficiency, unspecified: Secondary | ICD-10-CM | POA: Diagnosis not present

## 2021-04-30 DIAGNOSIS — M810 Age-related osteoporosis without current pathological fracture: Secondary | ICD-10-CM | POA: Diagnosis not present

## 2021-04-30 DIAGNOSIS — E039 Hypothyroidism, unspecified: Secondary | ICD-10-CM | POA: Diagnosis not present

## 2021-05-18 DIAGNOSIS — C50911 Malignant neoplasm of unspecified site of right female breast: Secondary | ICD-10-CM | POA: Diagnosis not present

## 2021-05-18 DIAGNOSIS — C7989 Secondary malignant neoplasm of other specified sites: Secondary | ICD-10-CM | POA: Diagnosis not present

## 2021-05-19 DIAGNOSIS — H43811 Vitreous degeneration, right eye: Secondary | ICD-10-CM | POA: Diagnosis not present

## 2021-07-10 DIAGNOSIS — Z23 Encounter for immunization: Secondary | ICD-10-CM | POA: Diagnosis not present

## 2021-07-30 DIAGNOSIS — Z1231 Encounter for screening mammogram for malignant neoplasm of breast: Secondary | ICD-10-CM | POA: Diagnosis not present

## 2021-08-03 DIAGNOSIS — M810 Age-related osteoporosis without current pathological fracture: Secondary | ICD-10-CM | POA: Diagnosis not present

## 2021-08-03 DIAGNOSIS — N183 Chronic kidney disease, stage 3 unspecified: Secondary | ICD-10-CM | POA: Diagnosis not present

## 2021-08-03 DIAGNOSIS — E7800 Pure hypercholesterolemia, unspecified: Secondary | ICD-10-CM | POA: Diagnosis not present

## 2021-08-03 DIAGNOSIS — E039 Hypothyroidism, unspecified: Secondary | ICD-10-CM | POA: Diagnosis not present

## 2021-08-10 NOTE — Progress Notes (Signed)
Patient Care Team: Janie Morning, DO as PCP - General (Family Medicine) Terrance Mass, MD (Inactive) as Referring Physician (Gynecology) Erroll Luna, MD as Consulting Physician (General Surgery) Nicholas Lose, MD as Consulting Physician (Hematology and Oncology) Kyung Rudd, MD as Consulting Physician (Radiation Oncology)  DIAGNOSIS:    ICD-10-CM   1. Malignant neoplasm of upper-inner quadrant of right breast in female, estrogen receptor positive ([place])  C50.211    Z17.0       SUMMARY OF ONCOLOGIC HISTORY: Oncology History  Breast cancer of upper-inner quadrant of right female breast ([place])  06/12/2014 Mammogram   Right breast architectural distortion (dense breasts); Ultrasound 1.3 cm mass at 1:00 position additional masses behind this mass measuring 1.3 cm and 0.7 cm. These lumps were palpable   06/13/2014 Initial Diagnosis   Right breast: 1.3cm mass: Invasive mammary cancer probably ductal with mammary ca in situ ER 100% PR 80% gases on 30% HER-2 negative; posterior 1.3 cm mass: IDC ER 100% PR 90% gets some 20% HER-2 negative and 0.7 cm mass: DCIS   07/25/2014 Surgery   Right simple mastectomy: Invasive ductal carcinoma No LVI, Pos for PNI, 3.1 cm grade 3 with DCIS grade 3, ER 100%, PR 100%, HER-2/neu negative Ki-67 20% T2, N0, M0 stage II A., Oncotype DX recurrence over 4, ROR 4%   08/14/2014 - 09/2020 Anti-estrogen oral therapy   Tamoxifen 20 mg daily   08/24/2020 Relapse/Recurrence   Right chest wall postmastectomy skin erythema with slight palpable nodularity. 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 CT CAP and bone scan on 09/03/20 showed no evidence of metastases.   10/07/2020 Surgery   Resection of right chest mass ([facility]): poorly differentiated carcinoma, with positive inferior and deep margins.    10/2020 -  Anti-estrogen oral therapy   Switched to anastrozole after resection of  recurrence      CHIEF COMPLIANT: Follow-up of recurrent right breast cancer  INTERVAL HISTORY: Lydia Roberts is a 75 y.o. with above-mentioned history of recurrent right breast cancer currently on anastrozole. She presents to the clinic today for follow-up.  She is tolerating anastrozole extremely well without any problems or concerns.  Denies any lumps or nodules in the chest wall or axillae.  Mammograms are normal.  ALLERGIES:  is allergic to elemental sulfur and codeine.  MEDICATIONS:  Current Outpatient Medications  Medication Sig Dispense Refill   anastrozole (ARIMIDEX) 1 MG tablet TAKE 1 TABLET BY MOUTH  DAILY 90 tablet 3   cholecalciferol (VITAMIN D) 1000 UNITS tablet Take 1,000 Units by mouth daily.     ezetimibe (ZETIA) 10 MG tablet Take 10 mg by mouth daily.     ibandronate (BONIVA) 150 MG tablet Take 1 tablet (150 mg total) by mouth every 30 (thirty) days. Take in the morning with a full glass of water, on an empty stomach, and do not take anything else by mouth or lie down for the next 30 min. 3 tablet 3   levothyroxine (SYNTHROID, LEVOTHROID) 125 MCG tablet Take 100 mcg by mouth daily.     RESTASIS 0.05 % ophthalmic emulsion      No current facility-administered medications for this visit.    PHYSICAL EXAMINATION: ECOG PERFORMANCE STATUS: 1 - Symptomatic but completely ambulatory  Vitals:   08/11/21 0858  BP: (!) 141/67  Pulse: 60  Resp: 17  Temp: (!) 97.2 F (36.2 C)  SpO2: 100%   Filed Weights   08/11/21 0858  Weight: 138 lb 1.6 oz (62.6 kg)    BREAST: No palpable masses or nodules in either right or left breasts. No palpable axillary supraclavicular or infraclavicular adenopathy no breast tenderness or nipple discharge. (exam performed in the presence of a chaperone)  LABORATORY DATA:  I have reviewed the data as listed CMP Latest Ref Rng & Units 09/29/2020 09/02/2020 09/22/2016  Glucose 70 - 99 mg/dL 94 96 127(H)  BUN 8 - 23 mg/dL '12 18 15  ' Creatinine  0.44 - 1.00 mg/dL 1.03(H) 1.10(H) 0.98  Sodium 135 - 145 mmol/L 138 141 136  Potassium 3.5 - 5.1 mmol/L 4.9 4.2 3.7  Chloride 98 - 111 mmol/L 104 107 104  CO2 22 - 32 mmol/L '27 26 22  ' Calcium 8.9 - 10.3 mg/dL 9.1 9.2 8.8(L)  Total Protein 6.5 - 8.1 g/dL 6.7 7.2 6.7  Total Bilirubin 0.3 - 1.2 mg/dL 0.7 1.0 1.6(H)  Alkaline Phos 38 - 126 U/L 39 42 36(L)  AST 15 - 41 U/L '21 20 21  ' ALT 0 - 44 U/L '14 11 14    ' Lab Results  Component Value Date   WBC 5.3 09/29/2020   HGB 11.8 (L) 09/29/2020   HCT 35.7 (L) 09/29/2020   MCV 93.7 09/29/2020   PLT 157 09/29/2020   NEUTROABS 2.0 09/29/2020    ASSESSMENT & PLAN:  Breast cancer of upper-inner quadrant of right female breast ([place]) 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 N0 M0 stage IA: Pathologic stage TII, N0, M0 stage II A., 3.1 cm tumor ER/PR positive HER-2 negative, grade 3, perineural invasion present but no lymphovascular invasion. 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  Adj XRT completed 01/13/21   Current Treatment: Anastrozole  Anastrozole Toxicities: Denies any hot flashes or arthralgias or myalgias.   Osteoporosis: On Boniva and calcium and vitamin D Claustrophobia: Patient has severe anxiety and panic disorder when she is in close spaces.    Breast cancer surveillance: Breast exam done 05/23/2021: Benign Mammogram [facility] breast [facility]: Benign  RTC in in 1 year    No orders of the defined types were placed in this  encounter.  The patient has a good understanding of the overall plan. she agrees with it. she will call with any problems that may develop before the next visit here.  Total time spent: 20 mins including face to face time and time spent for planning, charting and coordination of care  Rulon Eisenmenger, MD, MPH 08/11/2021  I, Thana Ates, am acting as scribe for Dr. Nicholas Lose.  I have reviewed the above documentation for accuracy and completeness, and I agree with the above.

## 2021-08-11 ENCOUNTER — Inpatient Hospital Stay: Payer: Medicare Other | Attending: Hematology and Oncology | Admitting: Hematology and Oncology

## 2021-08-11 ENCOUNTER — Other Ambulatory Visit: Payer: Self-pay

## 2021-08-11 DIAGNOSIS — Z170 Estrogen receptor positive status [ER+]: Secondary | ICD-10-CM | POA: Insufficient documentation

## 2021-08-11 DIAGNOSIS — F40240 Claustrophobia: Secondary | ICD-10-CM | POA: Diagnosis not present

## 2021-08-11 DIAGNOSIS — M810 Age-related osteoporosis without current pathological fracture: Secondary | ICD-10-CM | POA: Diagnosis not present

## 2021-08-11 DIAGNOSIS — C50211 Malignant neoplasm of upper-inner quadrant of right female breast: Secondary | ICD-10-CM | POA: Diagnosis not present

## 2021-08-11 NOTE — Assessment & Plan Note (Signed)
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 N0 M0 stage IA: Pathologic stage TII, N0, M0 stage II A., 3.1 cm tumor ER/PR positive HER-2 negative, grade 3, perineural invasion present but no lymphovascular invasion. Oncotype DX recurrence score 4, 4% ROR (Original diagnosis of right breast cancer status post lumpectomy and radiation in the 1990s)  PriorTreatment: Adjuvant Tamoxifen (Osteoporosis) started November 2015 switched to anastrozole 09/05/2020  Right chest wall postmastectomy skin erythemawith slight palpable nodularity: 08/15/20: Metastatic breast cancer ER/ PR Positive Her 2 Neg  Resection of right chest mass ([facility]): poorly differentiated carcinoma, with positive inferior and deep margins. Adj XRT completed 01/13/21  Current Treatment: Anastrozole  Anastrozole Toxicities: Denies any hot flashes or arthralgias or myalgias.  Osteoporosis: On Boniva and calcium and vitamin D Claustrophobia: Patient has severe anxiety and panic disorder when she is in close spaces.   RTC in in 1 year

## 2021-09-29 DIAGNOSIS — N183 Chronic kidney disease, stage 3 unspecified: Secondary | ICD-10-CM | POA: Diagnosis not present

## 2021-10-26 DIAGNOSIS — M810 Age-related osteoporosis without current pathological fracture: Secondary | ICD-10-CM | POA: Diagnosis not present

## 2021-10-26 DIAGNOSIS — E039 Hypothyroidism, unspecified: Secondary | ICD-10-CM | POA: Diagnosis not present

## 2021-10-26 DIAGNOSIS — R7309 Other abnormal glucose: Secondary | ICD-10-CM | POA: Diagnosis not present

## 2021-10-26 DIAGNOSIS — E785 Hyperlipidemia, unspecified: Secondary | ICD-10-CM | POA: Diagnosis not present

## 2021-10-26 DIAGNOSIS — Z5181 Encounter for therapeutic drug level monitoring: Secondary | ICD-10-CM | POA: Diagnosis not present

## 2021-10-26 DIAGNOSIS — E559 Vitamin D deficiency, unspecified: Secondary | ICD-10-CM | POA: Diagnosis not present

## 2021-10-29 ENCOUNTER — Other Ambulatory Visit: Payer: Self-pay | Admitting: Hematology and Oncology

## 2021-11-02 DIAGNOSIS — N1831 Chronic kidney disease, stage 3a: Secondary | ICD-10-CM | POA: Diagnosis not present

## 2021-11-02 DIAGNOSIS — C50919 Malignant neoplasm of unspecified site of unspecified female breast: Secondary | ICD-10-CM | POA: Diagnosis not present

## 2021-11-02 DIAGNOSIS — Z23 Encounter for immunization: Secondary | ICD-10-CM | POA: Diagnosis not present

## 2021-11-02 DIAGNOSIS — Z0000 Encounter for general adult medical examination without abnormal findings: Secondary | ICD-10-CM | POA: Diagnosis not present

## 2021-11-02 DIAGNOSIS — R7309 Other abnormal glucose: Secondary | ICD-10-CM | POA: Diagnosis not present

## 2021-11-02 DIAGNOSIS — E039 Hypothyroidism, unspecified: Secondary | ICD-10-CM | POA: Diagnosis not present

## 2021-11-02 DIAGNOSIS — E785 Hyperlipidemia, unspecified: Secondary | ICD-10-CM | POA: Diagnosis not present

## 2021-11-02 DIAGNOSIS — E559 Vitamin D deficiency, unspecified: Secondary | ICD-10-CM | POA: Diagnosis not present

## 2021-11-02 DIAGNOSIS — M810 Age-related osteoporosis without current pathological fracture: Secondary | ICD-10-CM | POA: Diagnosis not present

## 2021-11-10 ENCOUNTER — Emergency Department (HOSPITAL_COMMUNITY): Payer: Medicare Other

## 2021-11-10 ENCOUNTER — Emergency Department (HOSPITAL_COMMUNITY)
Admission: EM | Admit: 2021-11-10 | Discharge: 2021-11-10 | Disposition: A | Discharge status: Home / Self Care | Payer: Medicare Other | Attending: Emergency Medicine | Admitting: Emergency Medicine

## 2021-11-10 ENCOUNTER — Encounter (HOSPITAL_COMMUNITY): Payer: Self-pay | Admitting: *Deleted

## 2021-11-10 DIAGNOSIS — J341 Cyst and mucocele of nose and nasal sinus: Secondary | ICD-10-CM | POA: Diagnosis not present

## 2021-11-10 DIAGNOSIS — J3489 Other specified disorders of nose and nasal sinuses: Secondary | ICD-10-CM | POA: Diagnosis not present

## 2021-11-10 DIAGNOSIS — G319 Degenerative disease of nervous system, unspecified: Secondary | ICD-10-CM | POA: Diagnosis not present

## 2021-11-10 DIAGNOSIS — H532 Diplopia: Secondary | ICD-10-CM | POA: Insufficient documentation

## 2021-11-10 DIAGNOSIS — R29818 Other symptoms and signs involving the nervous system: Secondary | ICD-10-CM | POA: Diagnosis not present

## 2021-11-10 DIAGNOSIS — H538 Other visual disturbances: Secondary | ICD-10-CM | POA: Diagnosis not present

## 2021-11-10 LAB — URINALYSIS, ROUTINE W REFLEX MICROSCOPIC
Bacteria, UA: NONE SEEN
Bilirubin Urine: NEGATIVE
Glucose, UA: NEGATIVE mg/dL
Ketones, ur: NEGATIVE mg/dL
Leukocytes,Ua: NEGATIVE
Nitrite: NEGATIVE
Protein, ur: NEGATIVE mg/dL
Specific Gravity, Urine: 1.002 — ABNORMAL LOW (ref 1.005–1.030)
pH: 7 (ref 5.0–8.0)

## 2021-11-10 LAB — CBC WITH DIFFERENTIAL/PLATELET
Abs Immature Granulocytes: 0.01 10*3/uL (ref 0.00–0.07)
Basophils Absolute: 0 10*3/uL (ref 0.0–0.1)
Basophils Relative: 1 %
Eosinophils Absolute: 0.1 10*3/uL (ref 0.0–0.5)
Eosinophils Relative: 3 %
HCT: 38.6 % (ref 36.0–46.0)
Hemoglobin: 12.4 g/dL (ref 12.0–15.0)
Immature Granulocytes: 0 %
Lymphocytes Relative: 40 %
Lymphs Abs: 1.8 10*3/uL (ref 0.7–4.0)
MCH: 29.2 pg (ref 26.0–34.0)
MCHC: 32.1 g/dL (ref 30.0–36.0)
MCV: 90.8 fL (ref 80.0–100.0)
Monocytes Absolute: 0.6 10*3/uL (ref 0.1–1.0)
Monocytes Relative: 13 %
Neutro Abs: 2 10*3/uL (ref 1.7–7.7)
Neutrophils Relative %: 43 %
Platelets: 194 10*3/uL (ref 150–400)
RBC: 4.25 MIL/uL (ref 3.87–5.11)
RDW: 13.7 % (ref 11.5–15.5)
WBC: 4.6 10*3/uL (ref 4.0–10.5)
nRBC: 0 % (ref 0.0–0.2)

## 2021-11-10 LAB — COMPREHENSIVE METABOLIC PANEL
ALT: 14 U/L (ref 0–44)
AST: 24 U/L (ref 15–41)
Albumin: 4.1 g/dL (ref 3.5–5.0)
Alkaline Phosphatase: 45 U/L (ref 38–126)
Anion gap: 7 (ref 5–15)
BUN: 11 mg/dL (ref 8–23)
CO2: 26 mmol/L (ref 22–32)
Calcium: 9.7 mg/dL (ref 8.9–10.3)
Chloride: 103 mmol/L (ref 98–111)
Creatinine, Ser: 1.09 mg/dL — ABNORMAL HIGH (ref 0.44–1.00)
GFR, Estimated: 53 mL/min — ABNORMAL LOW (ref >60)
Glucose, Bld: 97 mg/dL (ref 70–99)
Potassium: 4.1 mmol/L (ref 3.5–5.1)
Sodium: 136 mmol/L (ref 135–145)
Total Bilirubin: 1.2 mg/dL (ref 0.3–1.2)
Total Protein: 7.1 g/dL (ref 6.5–8.1)

## 2021-11-10 LAB — I-STAT CHEM 8, ED
BUN: 12 mg/dL (ref 8–23)
Calcium, Ion: 1.26 mmol/L (ref 1.15–1.40)
Chloride: 102 mmol/L (ref 98–111)
Creatinine, Ser: 1.1 mg/dL — ABNORMAL HIGH (ref 0.44–1.00)
Glucose, Bld: 95 mg/dL (ref 70–99)
HCT: 38 % (ref 36.0–46.0)
Hemoglobin: 12.9 g/dL (ref 12.0–15.0)
Potassium: 4.1 mmol/L (ref 3.5–5.1)
Sodium: 139 mmol/L (ref 135–145)
TCO2: 27 mmol/L (ref 22–32)

## 2021-11-10 MED ORDER — LORAZEPAM 2 MG/ML IJ SOLN
1.0000 mg | Freq: Once | INTRAMUSCULAR | Status: AC
  Administered 2021-11-10: 1 mg via INTRAVENOUS
  Filled 2021-11-10: qty 1

## 2021-11-10 MED ORDER — LORAZEPAM 2 MG/ML IJ SOLN
0.5000 mg | Freq: Once | INTRAMUSCULAR | Status: AC
  Administered 2021-11-10: 0.5 mg via INTRAVENOUS
  Filled 2021-11-10: qty 1

## 2021-11-10 NOTE — Discharge Instructions (Signed)
Return if any problems.

## 2021-11-10 NOTE — ED Notes (Signed)
Patient transported to MRI 

## 2021-11-10 NOTE — ED Triage Notes (Signed)
To ED for eval of blurred vision - right eye, intermittently since 11am today. States she had double vision a few weeks ago - pcp aware. No further neuro deficits. Ambulatory. Speaks in full sentences.

## 2021-11-10 NOTE — ED Provider Notes (Signed)
[facility] EMERGENCY DEPARTMENT Provider Note   CSN: [id number] Arrival date & time: 11/10/21  1514     History  Chief Complaint  Patient presents with   Blurred Vision    Lydia Roberts is a 76 y.o. female.  Pt complains of blurred double vision.  Pt reports she has had double vision in the past but worse today.  Pt reports she was afraid to drive earlier due to double vision.  Pt reports she has told her primary MD about this in the past.  Patient reports her physician thinks that it is related to her thyroid medication.  Patient reports her physician recently decreased her thyroid medicine in an attempt to improve her vision.  Patient reports in the past double vision has happened for a brief periods of time however today around 11 AM she had an episode which is lasted an extended period of time.  When she covers her right eye she does not have double vision.  She does have double vision when she covers her left eye.  Patient denies any other symptoms she has not had a fever or chills she denies any cough or URI symptoms.  Patient has not had any weakness she denies any facial droop no hearing changes.  Denies any chest pain no abdominal pain at       Home Medications Prior to Admission medications   Medication Sig Start Date End Date Taking? Authorizing Provider  anastrozole (ARIMIDEX) 1 MG tablet TAKE 1 TABLET BY MOUTH  DAILY 03/27/21   Nicholas Lose, MD  cholecalciferol (VITAMIN D) 1000 UNITS tablet Take 1,000 Units by mouth daily.    [provider]  ezetimibe (ZETIA) 10 MG tablet Take 10 mg by mouth daily.    [provider]  ibandronate (BONIVA) 150 MG tablet TAKE 1 TABLET (150 MG TOTAL) BY MOUTH EVERY 30 (THIRTY) DAYS. TAKE IN THE MORNING WITH A FULL GLASS OF WATER, ON AN EMPTY STOMACH, AND DO NOT TAKE ANYTHING ELSE BY MOUTH OR LIE DOWN FOR THE NEXT 30 MIN. 10/29/21   Nicholas Lose, MD  levothyroxine (SYNTHROID, LEVOTHROID) 125 MCG tablet Take  100 mcg by mouth daily.    [provider]  RESTASIS 0.05 % ophthalmic emulsion  10/01/20   [provider]      Allergies    Elemental sulfur and Codeine    Review of Systems   Review of Systems  Eyes:  Positive for pain and visual disturbance.  Neurological:  Positive for headaches. Negative for facial asymmetry, light-headedness and numbness.  All other systems reviewed and are negative.  Physical Exam Updated Vital Signs BP 120/67    Pulse 73    Temp 98.6 F (37 C) (Oral)    Resp 16    SpO2 98%  Physical Exam Vitals and nursing note reviewed.  Constitutional:      Appearance: Normal appearance.  HENT:     Right Ear: Tympanic membrane normal.     Left Ear: Tympanic membrane normal.     Nose: Nose normal.     Mouth/Throat:     Mouth: Mucous membranes are moist.  Eyes:     Extraocular Movements: Extraocular movements intact.     Conjunctiva/sclera: Conjunctivae normal.     Pupils: Pupils are equal, round, and reactive to light.  Cardiovascular:     Rate and Rhythm: Normal rate and regular rhythm.     Pulses: Normal pulses.  Pulmonary:     Effort: Pulmonary effort  is normal.     Breath sounds: Normal breath sounds.  Abdominal:     General: Abdomen is flat.     Palpations: Abdomen is soft.  Musculoskeletal:        General: Normal range of motion.     Cervical back: Normal range of motion.  Skin:    General: Skin is warm.  Neurological:     General: No focal deficit present.     Mental Status: She is alert.  Psychiatric:        Mood and Affect: Mood normal.    ED Results / Procedures / Treatments   Labs (all labs ordered are listed, but only abnormal results are displayed) Labs Reviewed  COMPREHENSIVE METABOLIC PANEL - Abnormal; Notable for the following components:      Result Value   Creatinine, Ser 1.09 (*)    GFR, Estimated 53 (*)    All other components within normal limits  URINALYSIS, ROUTINE W REFLEX MICROSCOPIC - Abnormal; Notable  for the following components:   Color, Urine COLORLESS (*)    Specific Gravity, Urine 1.002 (*)    Hgb urine dipstick SMALL (*)    All other components within normal limits  I-STAT CHEM 8, ED - Abnormal; Notable for the following components:   Creatinine, Ser 1.10 (*)    All other components within normal limits  CBC WITH DIFFERENTIAL/PLATELET    EKG None  Radiology CT Head Wo Contrast  Result Date: 11/10/2021 CLINICAL DATA:  Neuro deficit, acute, stroke suspected. Intermittent right-sided blurred vision beginning this morning. Prior episode of diplopia a few weeks ago. EXAM: CT HEAD WITHOUT CONTRAST TECHNIQUE: Contiguous axial images were obtained from the base of the skull through the vertex without intravenous contrast. RADIATION DOSE REDUCTION: This exam was performed according to the departmental dose-optimization program which includes automated exposure control, adjustment of the mA and/or kV according to patient size and/or use of iterative reconstruction technique. COMPARISON:  Head CT 10/25/2013 FINDINGS: Brain: There is no evidence of an acute infarct, intracranial hemorrhage, mass, midline shift, or extra-axial fluid collection. The ventricles and sulci are normal. Asymmetric hypodensity in the left corona radiata is unchanged and nonspecific but may reflect minimal chronic small vessel ischemic disease. Vascular: Calcified atherosclerosis at the skull base. No hyperdense vessel. Skull: No fracture or suspicious osseous lesion. Sinuses/Orbits: Small mucous retention cyst in the right maxillary sinus. Clear mastoid air cells. Unremarkable orbits. Other: None. IMPRESSION: No evidence of acute intracranial abnormality. Electronically Signed   By: Logan Bores M.D.   On: 11/10/2021 16:29   MR BRAIN WO CONTRAST  Result Date: 11/10/2021 CLINICAL DATA:  Provided history: Neuro deficit, acute, stroke suspected. Additional history provided: Intermittent blurred vision in right eye since 11 a.m.  today. EXAM: MRI HEAD WITHOUT CONTRAST TECHNIQUE: Multiplanar, multiecho pulse sequences of the brain and surrounding structures were obtained without intravenous contrast. COMPARISON:  Head CT 11/10/2021. Head CT 10/25/2013. FINDINGS: Mild intermittent motion degradation. Brain: Minimal generalized cerebral and cerebellar atrophy, not unexpected for age. Mild multifocal T2 FLAIR hyperintense signal abnormality within the cerebral white matter, nonspecific but compatible with chronic small vessel ischemic disease. There is no acute infarct. No evidence of an intracranial mass. No chronic intracranial blood products. No extra-axial fluid collection. No midline shift. Vascular: Maintained flow voids within the proximal large arterial vessels. Skull and upper cervical spine: No focal suspicious marrow lesion. Sinuses/Orbits: Visualized orbits show no acute finding. Small mucous retention cyst within the right maxillary sinus. Trace mucosal thickening  within the bilateral ethmoid sinuses. Other: Small left mastoid effusion. Trace fluid also present within the right mastoid air cells. IMPRESSION: No evidence of acute intracranial abnormality. Mild chronic small vessel ischemic changes within the cerebral white matter. Minimal generalized cerebral and cerebellar atrophy, not unexpected for age. Mild paranasal sinus disease, as described. Small left mastoid effusion. Trace fluid also present within the right mastoid air cells. Electronically Signed   By: Kellie Simmering D.O.   On: 11/10/2021 19:54    Procedures Procedures    Medications Ordered in ED Medications  LORazepam (ATIVAN) injection 1 mg (1 mg Intravenous Given 11/10/21 1609)  LORazepam (ATIVAN) injection 0.5 mg (0.5 mg Intravenous Given 11/10/21 1813)    ED Course/ Medical Decision Making/ A&P                           Medical Decision Making Problems Addressed: Diplopia: acute illness or injury    Details: Pt has a history of double vision for brief  periods, persistant today  Amount and/or Complexity of Data Reviewed Independent Historian:     Details: family member  daughter External Data Reviewed: notes.    Details: Notes from [facility] [place] [facility] report some element of vitreous degenerative changes  Primary care notes and Oncology notes reviewed Labs: ordered. Decision-making details documented in ED Course.    Details: normal cbc,  normal chemistry Radiology: ordered and independent interpretation performed. Decision-making details documented in ED Course.    Details: MRi shows minimal cerebellar atrophy  and mild chronic vessel disease  Ct no evidence of acute stroke Discussion of management or test interpretation with external provider(s): I discussed pt with Dr. Posey [facility].  He advised follow up oupatient.  Call for appointment  Risk Prescription drug management.   Ultrasound of right eye.         Final Clinical Impression(s) / ED Diagnoses Final diagnoses:  Diplopia    Rx / DC Orders ED Discharge Orders     None     An After Visit Summary was printed and given to the patient.     Sidney Ace 11/10/21 2121    Regan Lemming, MD 11/10/21 [phone]

## 2021-11-11 DIAGNOSIS — H47392 Other disorders of optic disc, left eye: Secondary | ICD-10-CM | POA: Diagnosis not present

## 2021-11-11 DIAGNOSIS — H25813 Combined forms of age-related cataract, bilateral: Secondary | ICD-10-CM | POA: Diagnosis not present

## 2021-11-11 DIAGNOSIS — H532 Diplopia: Secondary | ICD-10-CM | POA: Diagnosis not present

## 2021-11-17 DIAGNOSIS — H2513 Age-related nuclear cataract, bilateral: Secondary | ICD-10-CM | POA: Diagnosis not present

## 2021-12-28 DIAGNOSIS — N1831 Chronic kidney disease, stage 3a: Secondary | ICD-10-CM | POA: Diagnosis not present

## 2021-12-28 DIAGNOSIS — R7309 Other abnormal glucose: Secondary | ICD-10-CM | POA: Diagnosis not present

## 2021-12-28 DIAGNOSIS — M810 Age-related osteoporosis without current pathological fracture: Secondary | ICD-10-CM | POA: Diagnosis not present

## 2021-12-28 DIAGNOSIS — E559 Vitamin D deficiency, unspecified: Secondary | ICD-10-CM | POA: Diagnosis not present

## 2021-12-28 DIAGNOSIS — E039 Hypothyroidism, unspecified: Secondary | ICD-10-CM | POA: Diagnosis not present

## 2021-12-28 DIAGNOSIS — E785 Hyperlipidemia, unspecified: Secondary | ICD-10-CM | POA: Diagnosis not present

## 2022-01-19 DIAGNOSIS — H25013 Cortical age-related cataract, bilateral: Secondary | ICD-10-CM | POA: Diagnosis not present

## 2022-01-19 DIAGNOSIS — H2511 Age-related nuclear cataract, right eye: Secondary | ICD-10-CM | POA: Diagnosis not present

## 2022-01-19 DIAGNOSIS — H2513 Age-related nuclear cataract, bilateral: Secondary | ICD-10-CM | POA: Diagnosis not present

## 2022-01-19 DIAGNOSIS — H04123 Dry eye syndrome of bilateral lacrimal glands: Secondary | ICD-10-CM | POA: Diagnosis not present

## 2022-01-19 DIAGNOSIS — H18413 Arcus senilis, bilateral: Secondary | ICD-10-CM | POA: Diagnosis not present

## 2022-02-20 ENCOUNTER — Other Ambulatory Visit: Payer: Self-pay | Admitting: Hematology and Oncology

## 2022-03-11 ENCOUNTER — Ambulatory Visit: Payer: Medicare Other | Admitting: Hematology and Oncology

## 2022-03-25 ENCOUNTER — Other Ambulatory Visit: Payer: Self-pay | Admitting: Hematology and Oncology

## 2022-04-09 DIAGNOSIS — H2512 Age-related nuclear cataract, left eye: Secondary | ICD-10-CM | POA: Diagnosis not present

## 2022-04-09 DIAGNOSIS — H2511 Age-related nuclear cataract, right eye: Secondary | ICD-10-CM | POA: Diagnosis not present

## 2022-04-23 DIAGNOSIS — H2512 Age-related nuclear cataract, left eye: Secondary | ICD-10-CM | POA: Diagnosis not present

## 2022-06-01 DIAGNOSIS — E785 Hyperlipidemia, unspecified: Secondary | ICD-10-CM | POA: Diagnosis not present

## 2022-06-01 DIAGNOSIS — N1831 Chronic kidney disease, stage 3a: Secondary | ICD-10-CM | POA: Diagnosis not present

## 2022-06-01 DIAGNOSIS — E039 Hypothyroidism, unspecified: Secondary | ICD-10-CM | POA: Diagnosis not present

## 2022-06-01 DIAGNOSIS — E559 Vitamin D deficiency, unspecified: Secondary | ICD-10-CM | POA: Diagnosis not present

## 2022-06-08 DIAGNOSIS — E785 Hyperlipidemia, unspecified: Secondary | ICD-10-CM | POA: Diagnosis not present

## 2022-06-08 DIAGNOSIS — C50919 Malignant neoplasm of unspecified site of unspecified female breast: Secondary | ICD-10-CM | POA: Diagnosis not present

## 2022-06-08 DIAGNOSIS — M810 Age-related osteoporosis without current pathological fracture: Secondary | ICD-10-CM | POA: Diagnosis not present

## 2022-06-08 DIAGNOSIS — E039 Hypothyroidism, unspecified: Secondary | ICD-10-CM | POA: Diagnosis not present

## 2022-06-08 DIAGNOSIS — N1831 Chronic kidney disease, stage 3a: Secondary | ICD-10-CM | POA: Diagnosis not present

## 2022-07-09 DIAGNOSIS — Z23 Encounter for immunization: Secondary | ICD-10-CM | POA: Diagnosis not present

## 2022-08-04 NOTE — Progress Notes (Incomplete)
Patient Care Team: Janie Morning, DO as PCP - General (Family Medicine) Terrance Mass, MD (Inactive) as Referring Physician (Gynecology) Erroll Luna, MD as Consulting Physician (General Surgery) Nicholas Lose, MD as Consulting Physician (Hematology and Oncology) Kyung Rudd, MD as Consulting Physician (Radiation Oncology)  DIAGNOSIS: No diagnosis found.  SUMMARY OF ONCOLOGIC HISTORY: Oncology History  Breast cancer of upper-inner quadrant of right female breast ([place])  06/12/2014 Mammogram   Right breast architectural distortion (dense breasts); Ultrasound 1.3 cm mass at 1:00 position additional masses behind this mass measuring 1.3 cm and 0.7 cm. These lumps were palpable   06/13/2014 Initial Diagnosis   Right breast: 1.3cm mass: Invasive mammary cancer probably ductal with mammary ca in situ ER 100% PR 80% gases on 30% HER-2 negative; posterior 1.3 cm mass: IDC ER 100% PR 90% gets some 20% HER-2 negative and 0.7 cm mass: DCIS   07/25/2014 Surgery   Right simple mastectomy: Invasive ductal carcinoma No LVI, Pos for PNI, 3.1 cm grade 3 with DCIS grade 3, ER 100%, PR 100%, HER-2/neu negative Ki-67 20% T2, N0, M0 stage II A., Oncotype DX recurrence over 4, ROR 4%   08/14/2014 - 09/2020 Anti-estrogen oral therapy   Tamoxifen 20 mg daily   08/24/2020 Relapse/Recurrence   Right chest wall postmastectomy skin erythema with slight palpable nodularity. 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 CT CAP and bone scan on 09/03/20 showed no evidence of metastases.   10/07/2020 Surgery   Resection of right chest mass ([facility]): poorly differentiated carcinoma, with positive inferior and deep margins.    10/2020 -  Anti-estrogen oral therapy   Switched to anastrozole after resection of recurrence      CHIEF COMPLIANT:   INTERVAL HISTORY: Lydia Roberts is a   ALLERGIES:  is allergic to elemental sulfur and  codeine.  MEDICATIONS:  Current Outpatient Medications  Medication Sig Dispense Refill   anastrozole (ARIMIDEX) 1 MG tablet TAKE 1 TABLET BY MOUTH  DAILY 90 tablet 3   cholecalciferol (VITAMIN D) 1000 UNITS tablet Take 1,000 Units by mouth daily.     ezetimibe (ZETIA) 10 MG tablet Take 10 mg by mouth daily.     ibandronate (BONIVA) 150 MG tablet TAKE 1 TABLET MONTHLY ON AN EMPTY STOMACH FULL GLASS  WATER 30 MINUTES BEFORE  FIRST FOOD, DRINK OR MEDS.  STAY UPRIGHT FOR 30 MINS 3 tablet 3   levothyroxine (SYNTHROID, LEVOTHROID) 125 MCG tablet Take 100 mcg by mouth daily.     RESTASIS 0.05 % ophthalmic emulsion      No current facility-administered medications for this visit.    PHYSICAL EXAMINATION: ECOG PERFORMANCE STATUS: {CHL ONC ECOG PS:[phone]}  There were no vitals filed for this visit. There were no vitals filed for this visit.  BREAST:*** No palpable masses or nodules in either right or left breasts. No palpable axillary supraclavicular or infraclavicular adenopathy no breast tenderness or nipple discharge. (exam performed in the presence of a chaperone)  LABORATORY DATA:  I have reviewed the data as listed    Latest Ref Rng & Units 11/10/2021    3:46 PM 11/10/2021    3:31 PM 09/29/2020   10:00 AM  CMP  Glucose 70 - 99 mg/dL 95  97  94   BUN 8 - 23 mg/dL _0 Creatinine 0.44 - 1.00 mg/dL 1.10  1.09  1.03   Sodium 135 - 145 mmol/L 139  136  138   Potassium 3.5 - 5.1 mmol/L 4.1  4.1  4.9   Chloride 98 - 111 mmol/L 102  103  104   CO2 22 - 32 mmol/L  26  27   Calcium 8.9 - 10.3 mg/dL  9.7  9.1   Total Protein 6.5 - 8.1 g/dL  7.1  6.7   Total Bilirubin 0.3 - 1.2 mg/dL  1.2  0.7   Alkaline Phos 38 - 126 U/L  45  39   AST 15 - 41 U/L  24  21   ALT 0 - 44 U/L  14  14     Lab Results  Component Value Date   WBC 4.6 11/10/2021   HGB 12.9 11/10/2021   HCT 38.0 11/10/2021   MCV 90.8 11/10/2021   PLT 194 11/10/2021   NEUTROABS 2.0 11/10/2021    ASSESSMENT &  PLAN:  No problem-specific Assessment & Plan notes found for this encounter.    No orders of the defined types were placed in this encounter.  The patient has a good understanding of the overall plan. she agrees with it. she will call with any problems that may develop before the next visit here. Total time spent: 30 mins including face to face time and time spent for planning, charting and co-ordination of care   Lydia Roberts, [place] 08/04/22    I Gardiner Coins am scribing for Dr. Lindi Adie  ***

## 2022-08-05 DIAGNOSIS — Z1231 Encounter for screening mammogram for malignant neoplasm of breast: Secondary | ICD-10-CM | POA: Diagnosis not present

## 2022-08-10 ENCOUNTER — Encounter: Payer: Self-pay | Admitting: Hematology and Oncology

## 2022-08-11 ENCOUNTER — Ambulatory Visit: Payer: Medicare Other | Admitting: Hematology and Oncology

## 2022-08-29 NOTE — Progress Notes (Signed)
Patient Care Team: Janie Morning, DO as PCP - General (Family Medicine) Terrance Mass, MD (Inactive) as Referring Physician (Gynecology) Erroll Luna, MD as Consulting Physician (General Surgery) Nicholas Lose, MD as Consulting Physician (Hematology and Oncology) Kyung Rudd, MD as Consulting Physician (Radiation Oncology)  DIAGNOSIS:  Encounter Diagnosis  Name Primary?   Malignant neoplasm of upper-inner quadrant of right breast in female, estrogen receptor positive ([place]) Yes    SUMMARY OF ONCOLOGIC HISTORY: Oncology History  Breast cancer of upper-inner quadrant of right female breast ([place])  06/12/2014 Mammogram   Right breast architectural distortion (dense breasts); Ultrasound 1.3 cm mass at 1:00 position additional masses behind this mass measuring 1.3 cm and 0.7 cm. These lumps were palpable   06/13/2014 Initial Diagnosis   Right breast: 1.3cm mass: Invasive mammary cancer probably ductal with mammary ca in situ ER 100% PR 80% gases on 30% HER-2 negative; posterior 1.3 cm mass: IDC ER 100% PR 90% gets some 20% HER-2 negative and 0.7 cm mass: DCIS   07/25/2014 Surgery   Right simple mastectomy: Invasive ductal carcinoma No LVI, Pos for PNI, 3.1 cm grade 3 with DCIS grade 3, ER 100%, PR 100%, HER-2/neu negative Ki-67 20% T2, N0, M0 stage II A., Oncotype DX recurrence over 4, ROR 4%   08/14/2014 - 09/2020 Anti-estrogen oral therapy   Tamoxifen 20 mg daily   08/24/2020 Relapse/Recurrence   Right chest wall postmastectomy skin erythema with slight palpable nodularity. 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 CT CAP and bone scan on 09/03/20 showed no evidence of metastases.   10/07/2020 Surgery   Resection of right chest mass ([facility]): poorly differentiated carcinoma, with positive inferior and deep margins.    10/2020 -  Anti-estrogen oral therapy   Switched to anastrozole after resection of  recurrence      CHIEF COMPLIANT: Follow-up of recurrent right breast cancer   INTERVAL HISTORY: Lydia Roberts is a 76 y.o. with above-mentioned history of recurrent right breast cancer currently on anastrozole. She presents to the clinic today for follow-up. She states that she is tolerating the anastrozole extremely well with no side effects or concerns.    ALLERGIES:  is allergic to elemental sulfur and codeine.  MEDICATIONS:  Current Outpatient Medications  Medication Sig Dispense Refill   anastrozole (ARIMIDEX) 1 MG tablet TAKE 1 TABLET BY MOUTH  DAILY 90 tablet 3   cholecalciferol (VITAMIN D) 1000 UNITS tablet Take 1,000 Units by mouth daily.     ezetimibe (ZETIA) 10 MG tablet Take 10 mg by mouth daily.     ibandronate (BONIVA) 150 MG tablet TAKE 1 TABLET MONTHLY ON AN EMPTY STOMACH FULL GLASS  WATER 30 MINUTES BEFORE  FIRST FOOD, DRINK OR MEDS.  STAY UPRIGHT FOR 30 MINS 3 tablet 3   levothyroxine (SYNTHROID, LEVOTHROID) 125 MCG tablet Take 100 mcg by mouth daily.     RESTASIS 0.05 % ophthalmic emulsion      No current facility-administered medications for this visit.    PHYSICAL EXAMINATION: ECOG PERFORMANCE STATUS: 1 - Symptomatic but completely ambulatory  Vitals:   09/01/22 1006  BP: (!) 144/77  Pulse: (!) 59  Resp: 18  Temp: 97.7 F (36.5 C)  SpO2: 100%   Filed Weights   09/01/22 1006  Weight: 143 lb 1.6 oz (64.9 kg)    BREAST: No palpable lumps or nodules in the right chest wall or axilla.  Left breast is without any lumps  or nodules.  (exam performed in the presence of a chaperone)  LABORATORY DATA:  I have reviewed the data as listed    Latest Ref Rng & Units 11/10/2021    3:46 PM 11/10/2021    3:31 PM 09/29/2020   10:00 AM  CMP  Glucose 70 - 99 mg/dL 95  97  94   BUN 8 - 23 mg/dL _0 Creatinine 0.44 - 1.00 mg/dL 1.10  1.09  1.03   Sodium 135 - 145 mmol/L 139  136  138   Potassium 3.5 - 5.1 mmol/L 4.1  4.1  4.9   Chloride 98 - 111 mmol/L 102   103  104   CO2 22 - 32 mmol/L  26  27   Calcium 8.9 - 10.3 mg/dL  9.7  9.1   Total Protein 6.5 - 8.1 g/dL  7.1  6.7   Total Bilirubin 0.3 - 1.2 mg/dL  1.2  0.7   Alkaline Phos 38 - 126 U/L  45  39   AST 15 - 41 U/L  24  21   ALT 0 - 44 U/L  14  14     Lab Results  Component Value Date   WBC 4.6 11/10/2021   HGB 12.9 11/10/2021   HCT 38.0 11/10/2021   MCV 90.8 11/10/2021   PLT 194 11/10/2021   NEUTROABS 2.0 11/10/2021    ASSESSMENT & PLAN:  Breast cancer of upper-inner quadrant of right female breast ([place]) 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 N0 M0 stage IA: Pathologic stage TII, N0, M0 stage II A., 3.1 cm tumor ER/PR positive HER-2 negative, grade 3, perineural invasion present but no lymphovascular invasion. 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  Adj XRT completed 01/13/21   Current Treatment: Anastrozole  Anastrozole Toxicities: Denies any hot flashes or arthralgias or myalgias.   Osteoporosis: On Boniva and calcium and vitamin D Claustrophobia: Patient has severe anxiety and panic disorder when she is in close spaces.    Breast cancer surveillance: Breast exam done 09/01/22: Benign Mammogram left breast at [facility] 08/05/2022: Benign breast density category C   RTC in in 1 year    No orders of the defined types were placed in this encounter.  The patient has a good understanding of the overall plan. she agrees with it. she will call with any problems that may  develop before the next visit here. Total time spent: 30 mins including face to face time and time spent for planning, charting and co-ordination of care   Harriette Ohara, MD 09/01/22    I Gardiner Coins am scribing for Dr. Lindi Adie  I have reviewed the above documentation for accuracy and completeness, and I agree with the above.

## 2022-09-01 ENCOUNTER — Inpatient Hospital Stay: Payer: Medicare Other | Attending: Hematology and Oncology | Admitting: Hematology and Oncology

## 2022-09-01 VITALS — BP 144/77 | HR 59 | Temp 97.7°F | Resp 18 | Ht 65.0 in | Wt 143.1 lb

## 2022-09-01 DIAGNOSIS — Z170 Estrogen receptor positive status [ER+]: Secondary | ICD-10-CM | POA: Diagnosis not present

## 2022-09-01 DIAGNOSIS — Z79811 Long term (current) use of aromatase inhibitors: Secondary | ICD-10-CM | POA: Diagnosis not present

## 2022-09-01 DIAGNOSIS — F40240 Claustrophobia: Secondary | ICD-10-CM | POA: Diagnosis not present

## 2022-09-01 DIAGNOSIS — M810 Age-related osteoporosis without current pathological fracture: Secondary | ICD-10-CM | POA: Insufficient documentation

## 2022-09-01 DIAGNOSIS — C50211 Malignant neoplasm of upper-inner quadrant of right female breast: Secondary | ICD-10-CM

## 2022-09-01 NOTE — Assessment & Plan Note (Signed)
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 N0 M0 stage IA: Pathologic stage TII, N0, M0 stage II A., 3.1 cm tumor ER/PR positive HER-2 negative, grade 3, perineural invasion present but no lymphovascular invasion. 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  Adj XRT completed 01/13/21   Current Treatment: Anastrozole  Anastrozole Toxicities: Denies any hot flashes or arthralgias or myalgias.   Osteoporosis: On Boniva and calcium and vitamin D Claustrophobia: Patient has severe anxiety and panic disorder when she is in close spaces.    Breast cancer surveillance: Breast exam done 09/01/22: Benign Mammogram left breast at [facility] 08/05/2022: Benign breast density category C   RTC in in 1 year

## 2022-12-07 DIAGNOSIS — M810 Age-related osteoporosis without current pathological fracture: Secondary | ICD-10-CM | POA: Diagnosis not present

## 2022-12-07 DIAGNOSIS — E039 Hypothyroidism, unspecified: Secondary | ICD-10-CM | POA: Diagnosis not present

## 2022-12-07 DIAGNOSIS — N1831 Chronic kidney disease, stage 3a: Secondary | ICD-10-CM | POA: Diagnosis not present

## 2022-12-07 DIAGNOSIS — E785 Hyperlipidemia, unspecified: Secondary | ICD-10-CM | POA: Diagnosis not present

## 2022-12-14 DIAGNOSIS — Z0000 Encounter for general adult medical examination without abnormal findings: Secondary | ICD-10-CM | POA: Diagnosis not present

## 2022-12-14 DIAGNOSIS — E039 Hypothyroidism, unspecified: Secondary | ICD-10-CM | POA: Diagnosis not present

## 2022-12-14 DIAGNOSIS — M810 Age-related osteoporosis without current pathological fracture: Secondary | ICD-10-CM | POA: Diagnosis not present

## 2022-12-14 DIAGNOSIS — N1831 Chronic kidney disease, stage 3a: Secondary | ICD-10-CM | POA: Diagnosis not present

## 2022-12-14 DIAGNOSIS — C50919 Malignant neoplasm of unspecified site of unspecified female breast: Secondary | ICD-10-CM | POA: Diagnosis not present

## 2022-12-14 DIAGNOSIS — E785 Hyperlipidemia, unspecified: Secondary | ICD-10-CM | POA: Diagnosis not present

## 2022-12-23 ENCOUNTER — Other Ambulatory Visit: Payer: Self-pay | Admitting: Hematology and Oncology

## 2022-12-31 ENCOUNTER — Other Ambulatory Visit: Payer: Self-pay | Admitting: Hematology and Oncology

## 2023-04-18 IMAGING — CT CT HEAD W/O CM
3 of 4 series · 14 of 47 positions shown, 16 images · non-contrast
Comparison: Head CT 10/25/2013

CLINICAL DATA: Neuro deficit, acute, stroke suspected. Intermittent
right-sided blurred vision beginning this morning. Prior episode of
diplopia a few weeks ago.



[Series 3: head_bn · axial · 0.33mm/px · z∈[-149,-21]mm · 8 of 82 slices shown, 10 images]
[im 9/82  brain]
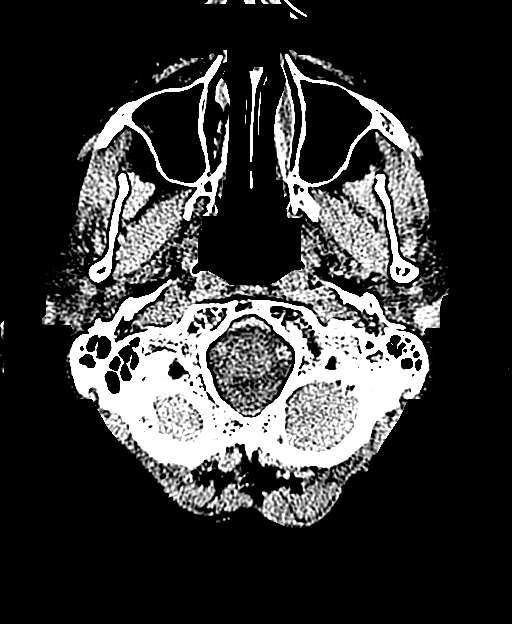
[im 9/82  bone]
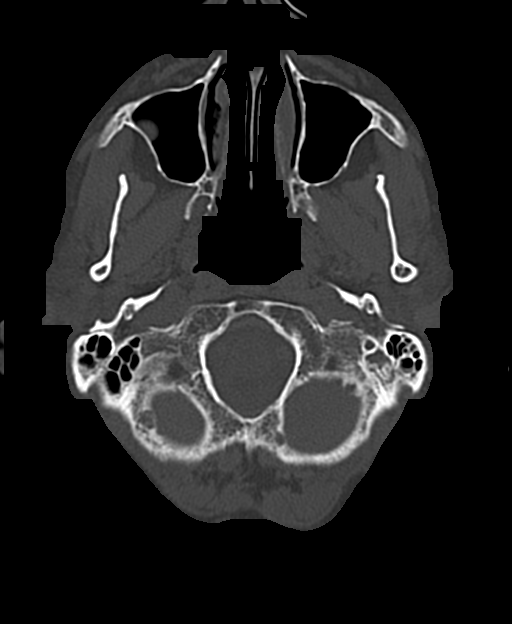
[im 17/82  brain]
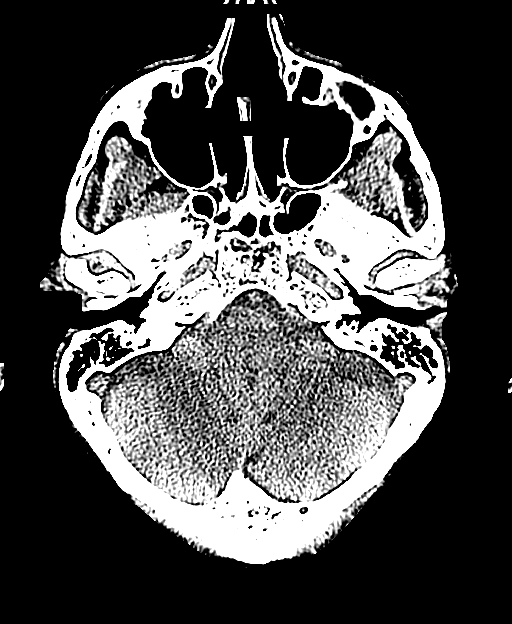
[im 25/82  brain]
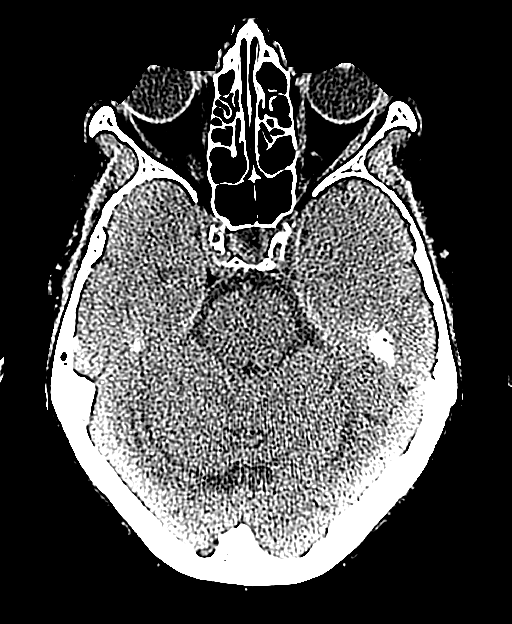
[im 37/82  brain]
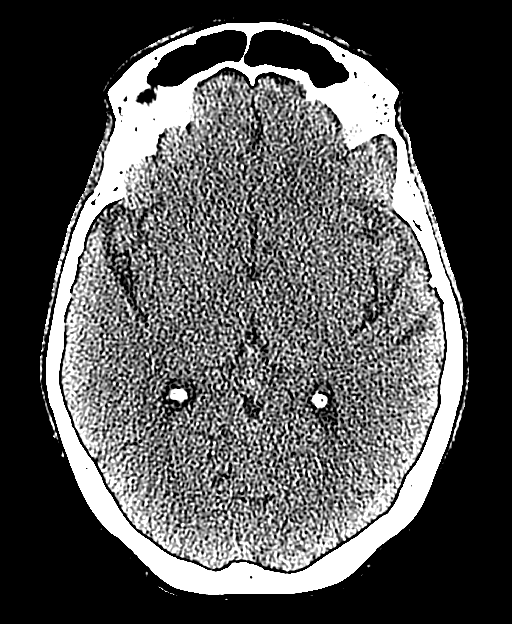
[im 45/82  brain]
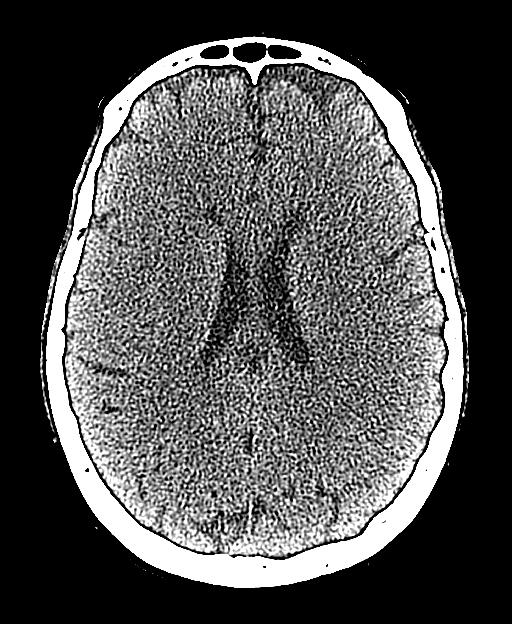
[im 45/82  bone]
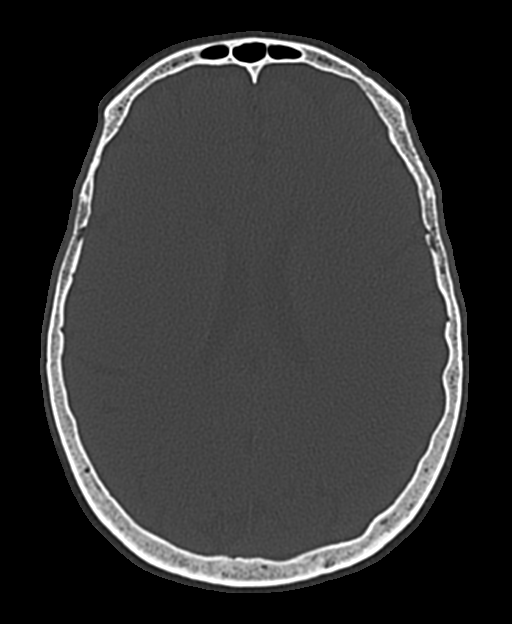
[im 57/82  brain]
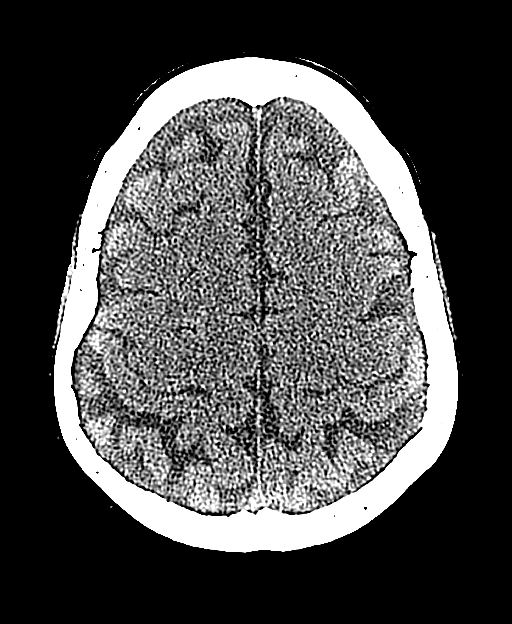
[im 65/82  brain]
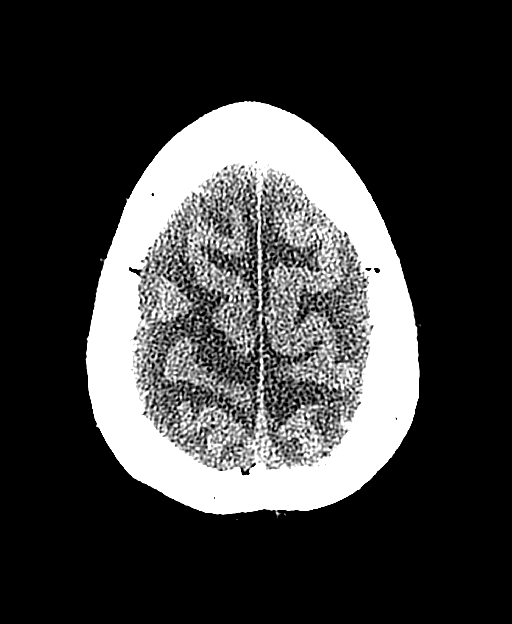
[im 73/82  brain]
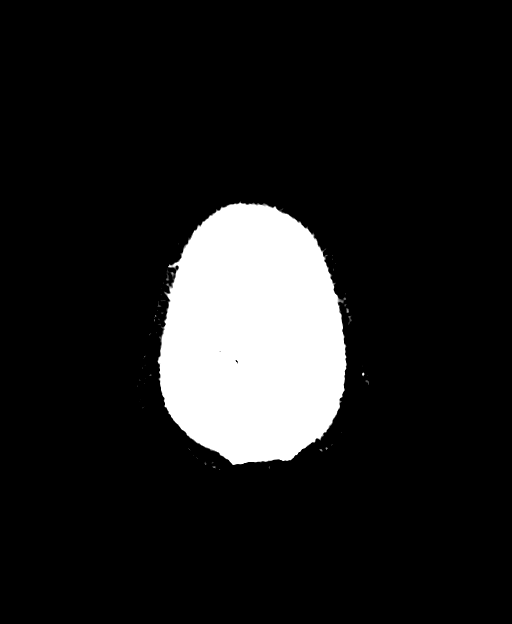

[Series 5: head without cor · coronal · non-contrast · 0.31mm/px · 3 of 69 slices shown]
[im 23/69  brain]
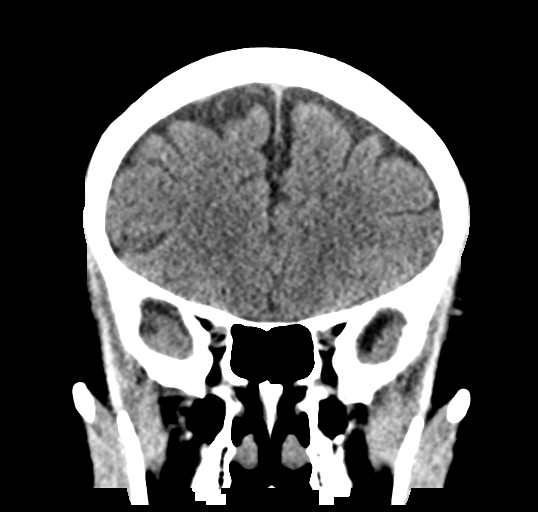
[im 31/69  brain]
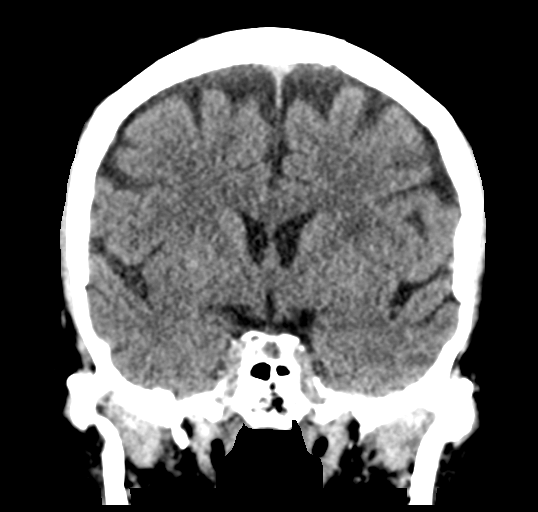
[im 38/69  brain]
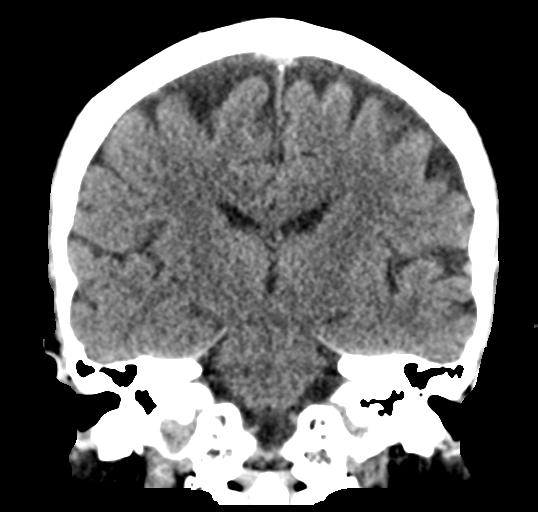

[Series 6: head without sag · sagittal · non-contrast · 0.32mm/px · 3 of 53 slices shown]
[im 18/53  brain]
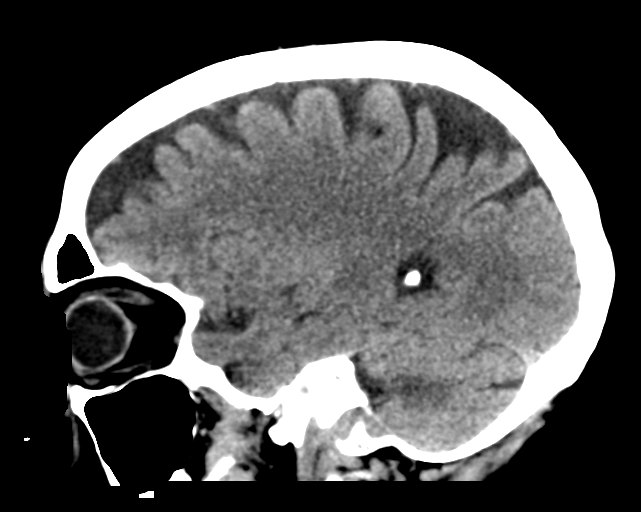
[im 27/53  brain]
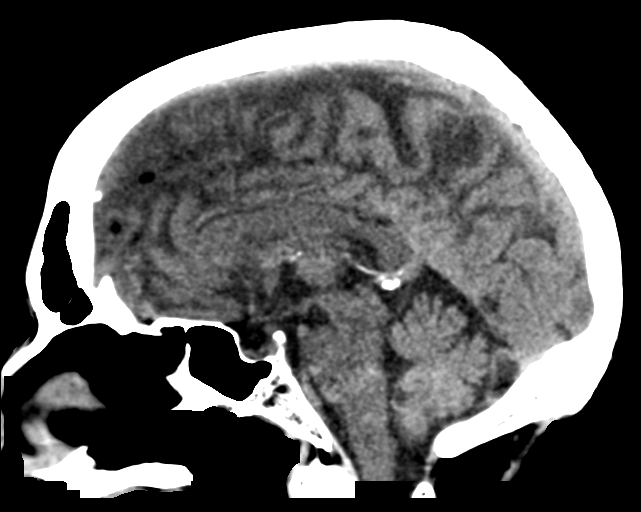
[im 35/53  brain]
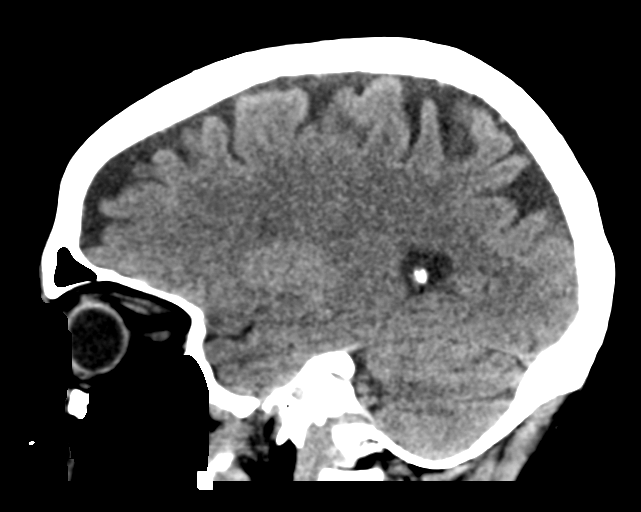

[14 of 47 positions shown; findings below may reference images not displayed]

FINDINGS: Brain: There is no evidence of an acute infarct, intracranial
hemorrhage, mass, midline shift, or extra-axial fluid collection.
The ventricles and sulci are normal. Asymmetric hypodensity in the
left corona radiata is unchanged and nonspecific but may reflect
minimal chronic small vessel ischemic disease.

Vascular: Calcified atherosclerosis at the skull base. No hyperdense
vessel.

Skull: No fracture or suspicious osseous lesion.

Sinuses/Orbits: Small mucous retention cyst in the right maxillary
sinus. Clear mastoid air cells. Unremarkable orbits.

Other: None.
IMPRESSION: No evidence of acute intracranial abnormality.

## 2023-04-18 IMAGING — MR MR HEAD W/O CM
6 of 10 series · 29 of 48 positions shown · non-contrast
Comparison: Head CT 11/10/2021. Head CT 10/25/2013.

CLINICAL DATA: Provided history: Neuro deficit, acute, stroke
suspected. Additional history provided: Intermittent blurred vision
in right eye since 11 a.m. today.

EXAM:
MRI HEAD WITHOUT CONTRAST
TECHNIQUE: Multiplanar, multiecho pulse sequences of the brain and surrounding
structures were obtained without intravenous contrast.

[Series 2: DWI · axial · 3.0mm · 0.94mm/px · z∈[-82,+64]mm · 9 of 99 slices shown (1 of 2)]
[im 1/99]
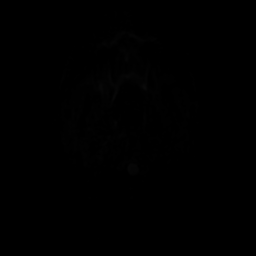
[im 13/99]
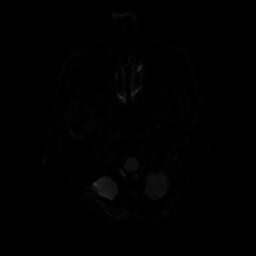
[im 25/99]
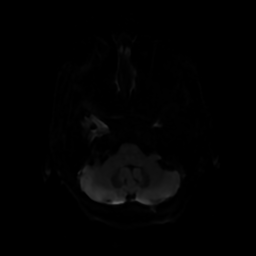
[im 37/99]
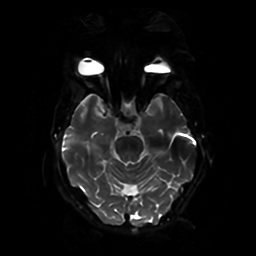
[im 50/99]
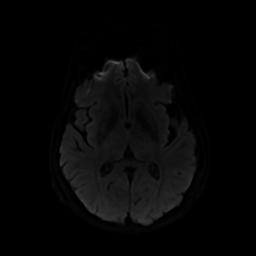
[im 62/99]
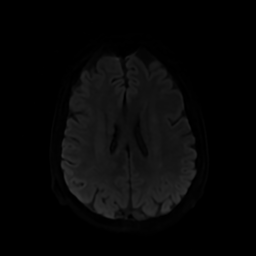
[im 74/99]
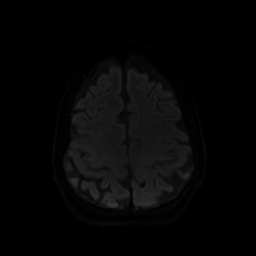
[im 86/99]
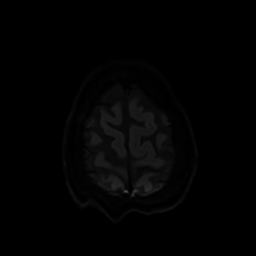
[im 99/99]
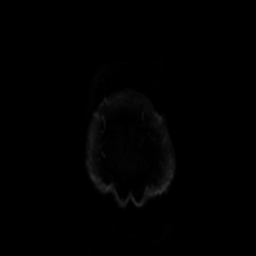

[Series 3: DWI · coronal · 4.0mm · 0.94mm/px · 7 of 74 slices shown (2 of 2)]
[im 1/74]
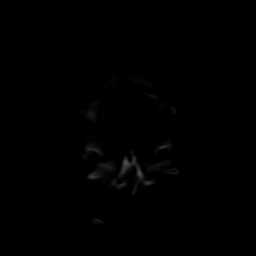
[im 13/74]
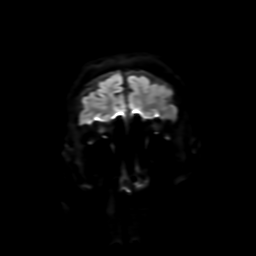
[im 25/74]
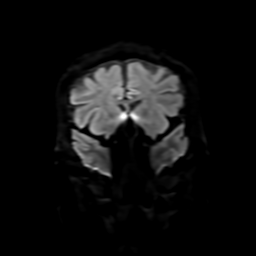
[im 37/74]
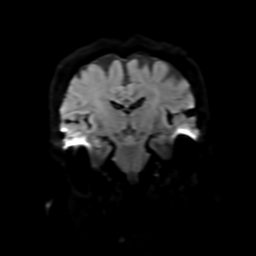
[im 49/74]
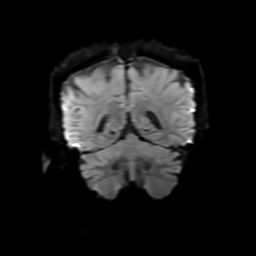
[im 61/74]
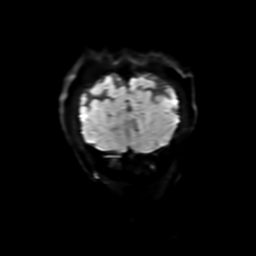
[im 74/74]
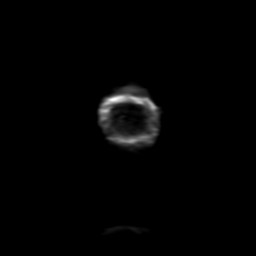

[Series 4: FLAIR · sagittal · 5.0mm · 0.23mm/px · 2 of 25 slices shown (1 of 2)]
[im 1/25]
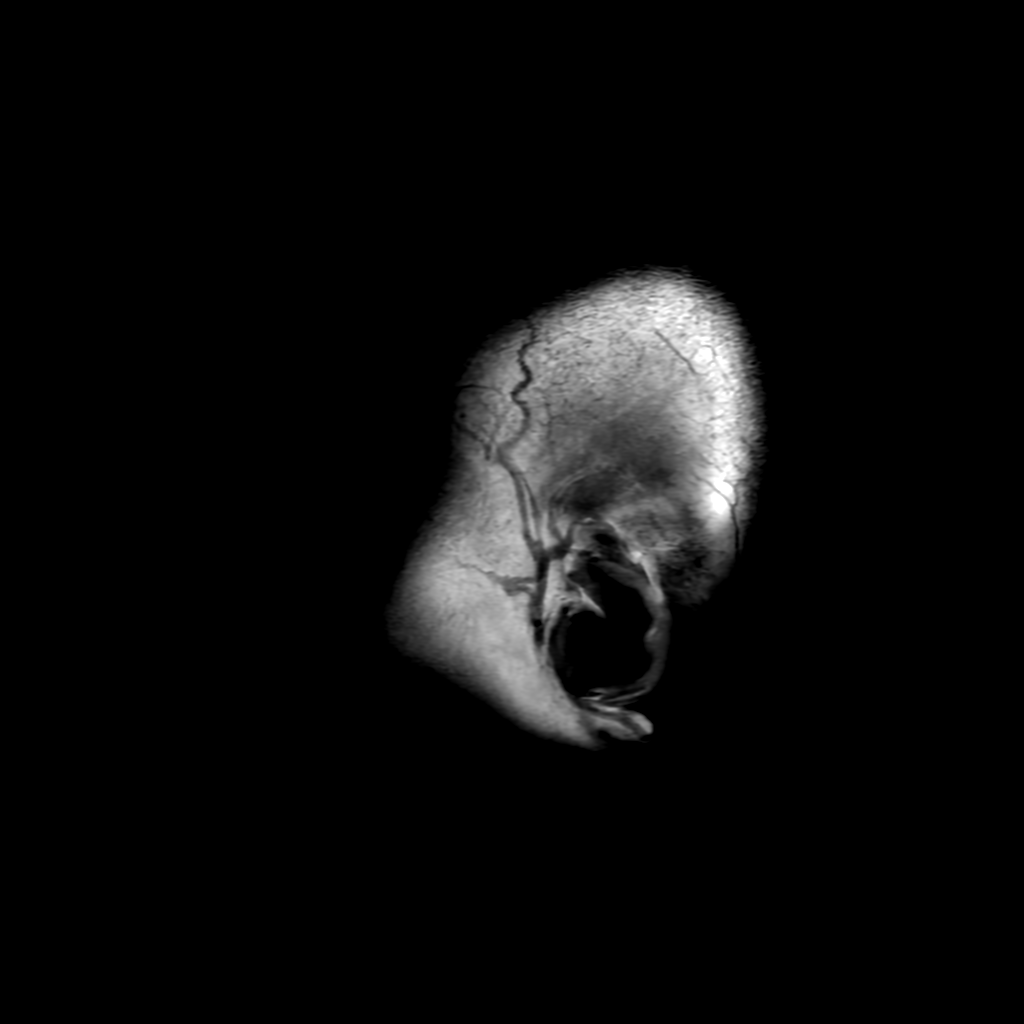
[im 25/25]
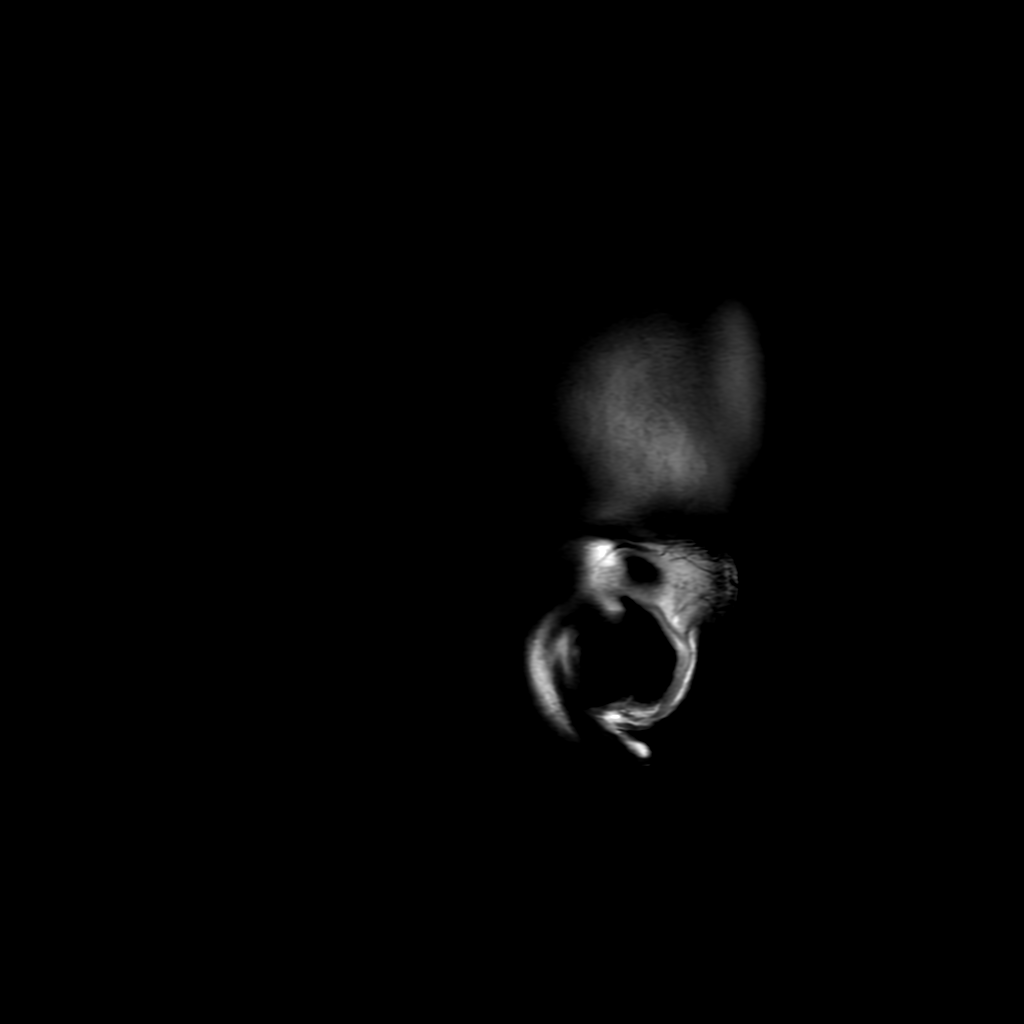

[Series 6: FLAIR · axial · 4.0mm · 0.45mm/px · z∈[-83,+65]mm · 3 of 35 slices shown (2 of 2)]
[im 1/35]
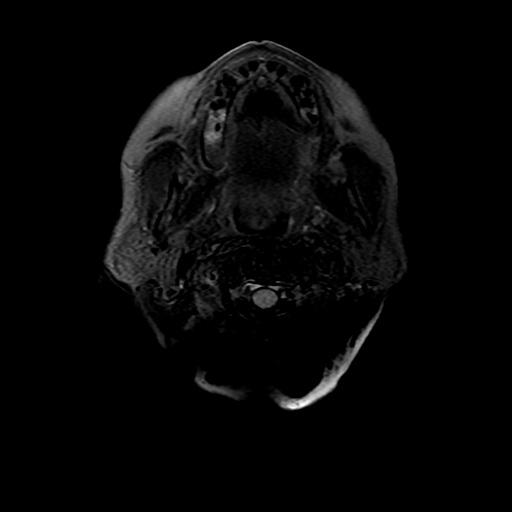
[im 18/35]
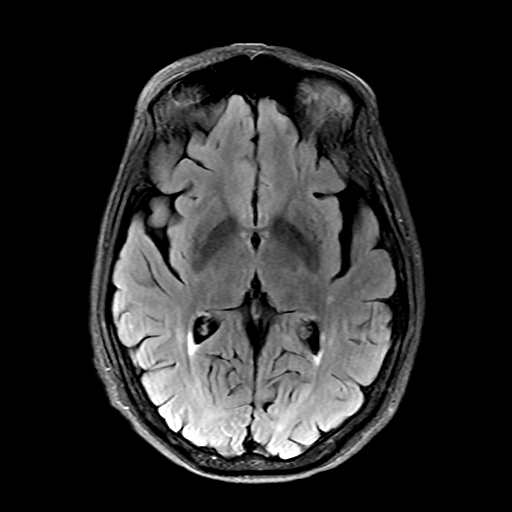
[im 35/35]
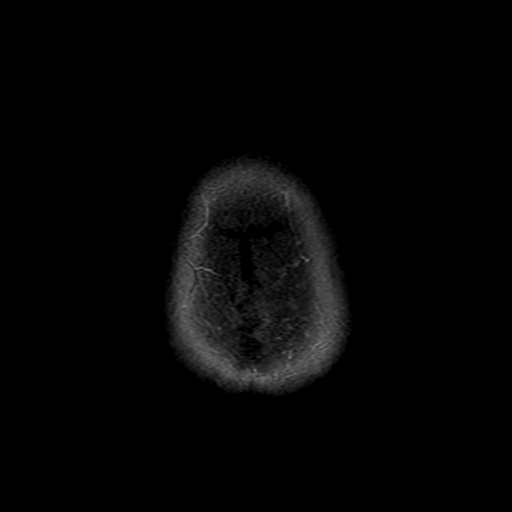

[Series 250: ADC · axial · 3.0mm · 0.94mm/px · z∈[-82,+64]mm · 5 of 50 slices shown (1 of 2)]
[im 1/50]
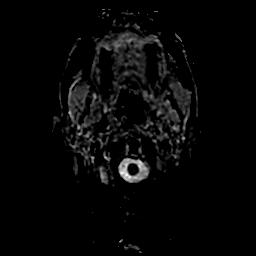
[im 13/50]
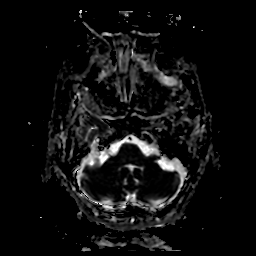
[im 25/50]
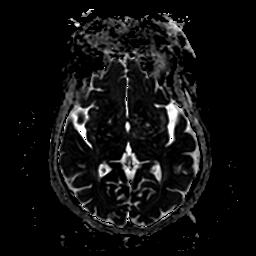
[im 37/50]
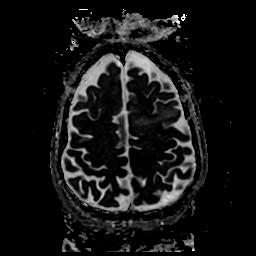
[im 50/50]
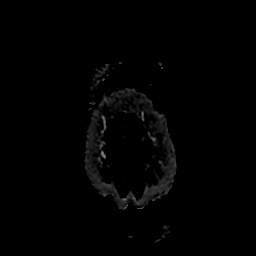

[Series 350: ADC · coronal · 4.0mm · 0.94mm/px · 3 of 37 slices shown (2 of 2)]
[im 1/37]
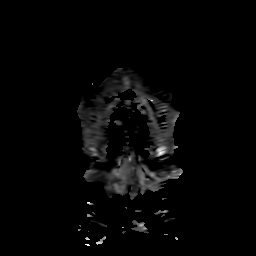
[im 19/37]
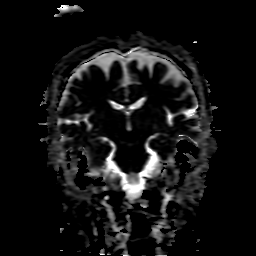
[im 37/37]
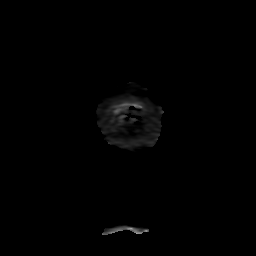

[29 of 48 positions shown; findings below may reference images not displayed]

FINDINGS: Mild intermittent motion degradation.

Brain:

Minimal generalized cerebral and cerebellar atrophy, not unexpected
for age.

Mild multifocal T2 FLAIR hyperintense signal abnormality within the
cerebral white matter, nonspecific but compatible with chronic small
vessel ischemic disease.

There is no acute infarct.

No evidence of an intracranial mass.

No chronic intracranial blood products.

No extra-axial fluid collection.

No midline shift.

Vascular: Maintained flow voids within the proximal large arterial
vessels.

Skull and upper cervical spine: No focal suspicious marrow lesion.

Sinuses/Orbits: Visualized orbits show no acute finding. Small
mucous retention cyst within the right maxillary sinus. Trace
mucosal thickening within the bilateral ethmoid sinuses.

Other: Small left mastoid effusion. Trace fluid also present within
the right mastoid air cells.
IMPRESSION: No evidence of acute intracranial abnormality.

Mild chronic small vessel ischemic changes within the cerebral white
matter.

Minimal generalized cerebral and cerebellar atrophy, not unexpected
for age.

Mild paranasal sinus disease, as described.

Small left mastoid effusion. Trace fluid also present within the
right mastoid air cells.

## 2023-05-07 ENCOUNTER — Observation Stay (HOSPITAL_COMMUNITY): Payer: Medicare Other

## 2023-05-07 ENCOUNTER — Other Ambulatory Visit: Payer: Self-pay

## 2023-05-07 ENCOUNTER — Observation Stay (HOSPITAL_COMMUNITY)
Admission: EM | Admit: 2023-05-07 | Discharge: 2023-05-08 | Disposition: A | Discharge status: Home / Self Care | Payer: Medicare Other | Attending: Family Medicine | Admitting: Family Medicine

## 2023-05-07 ENCOUNTER — Encounter (HOSPITAL_COMMUNITY): Payer: Self-pay | Admitting: *Deleted

## 2023-05-07 DIAGNOSIS — R7303 Prediabetes: Secondary | ICD-10-CM | POA: Insufficient documentation

## 2023-05-07 DIAGNOSIS — Z79899 Other long term (current) drug therapy: Secondary | ICD-10-CM | POA: Diagnosis not present

## 2023-05-07 DIAGNOSIS — R29898 Other symptoms and signs involving the musculoskeletal system: Principal | ICD-10-CM

## 2023-05-07 DIAGNOSIS — R519 Headache, unspecified: Secondary | ICD-10-CM | POA: Diagnosis not present

## 2023-05-07 DIAGNOSIS — C50211 Malignant neoplasm of upper-inner quadrant of right female breast: Secondary | ICD-10-CM | POA: Diagnosis not present

## 2023-05-07 DIAGNOSIS — G459 Transient cerebral ischemic attack, unspecified: Secondary | ICD-10-CM | POA: Diagnosis not present

## 2023-05-07 DIAGNOSIS — M6281 Muscle weakness (generalized): Secondary | ICD-10-CM | POA: Insufficient documentation

## 2023-05-07 DIAGNOSIS — R531 Weakness: Secondary | ICD-10-CM | POA: Diagnosis not present

## 2023-05-07 LAB — COMPREHENSIVE METABOLIC PANEL
ALT: 15 U/L (ref 0–44)
AST: 25 U/L (ref 15–41)
Albumin: 3.9 g/dL (ref 3.5–5.0)
Alkaline Phosphatase: 45 U/L (ref 38–126)
Anion gap: 12 (ref 5–15)
BUN: 14 mg/dL (ref 8–23)
CO2: 25 mmol/L (ref 22–32)
Calcium: 9.5 mg/dL (ref 8.9–10.3)
Chloride: 98 mmol/L (ref 98–111)
Creatinine, Ser: 0.98 mg/dL (ref 0.44–1.00)
GFR, Estimated: 59 mL/min — ABNORMAL LOW (ref >60)
Glucose, Bld: 104 mg/dL — ABNORMAL HIGH (ref 70–99)
Potassium: 4.2 mmol/L (ref 3.5–5.1)
Sodium: 135 mmol/L (ref 135–145)
Total Bilirubin: 1.3 mg/dL — ABNORMAL HIGH (ref 0.3–1.2)
Total Protein: 6.9 g/dL (ref 6.5–8.1)

## 2023-05-07 LAB — CBC WITH DIFFERENTIAL/PLATELET
Abs Immature Granulocytes: 0.01 10*3/uL (ref 0.00–0.07)
Basophils Absolute: 0 10*3/uL (ref 0.0–0.1)
Basophils Relative: 1 %
Eosinophils Absolute: 0 10*3/uL (ref 0.0–0.5)
Eosinophils Relative: 1 %
HCT: 37.6 % (ref 36.0–46.0)
Hemoglobin: 12.1 g/dL (ref 12.0–15.0)
Immature Granulocytes: 0 %
Lymphocytes Relative: 29 %
Lymphs Abs: 1.3 10*3/uL (ref 0.7–4.0)
MCH: 28.4 pg (ref 26.0–34.0)
MCHC: 32.2 g/dL (ref 30.0–36.0)
MCV: 88.3 fL (ref 80.0–100.0)
Monocytes Absolute: 0.4 10*3/uL (ref 0.1–1.0)
Monocytes Relative: 10 %
Neutro Abs: 2.6 10*3/uL (ref 1.7–7.7)
Neutrophils Relative %: 59 %
Platelets: 188 10*3/uL (ref 150–400)
RBC: 4.26 MIL/uL (ref 3.87–5.11)
RDW: 14.5 % (ref 11.5–15.5)
WBC: 4.4 10*3/uL (ref 4.0–10.5)
nRBC: 0 % (ref 0.0–0.2)

## 2023-05-07 LAB — PROTIME-INR
INR: 1 (ref 0.8–1.2)
Prothrombin Time: 13.7 seconds (ref 11.4–15.2)

## 2023-05-07 LAB — CBC
HCT: 35.3 % — ABNORMAL LOW (ref 36.0–46.0)
Hemoglobin: 11 g/dL — ABNORMAL LOW (ref 12.0–15.0)
MCH: 28.1 pg (ref 26.0–34.0)
MCHC: 31.2 g/dL (ref 30.0–36.0)
MCV: 90.3 fL (ref 80.0–100.0)
Platelets: 161 10*3/uL (ref 150–400)
RBC: 3.91 MIL/uL (ref 3.87–5.11)
RDW: 14.5 % (ref 11.5–15.5)
WBC: 3.6 10*3/uL — ABNORMAL LOW (ref 4.0–10.5)
nRBC: 0 % (ref 0.0–0.2)

## 2023-05-07 LAB — CREATININE, SERUM
Creatinine, Ser: 0.88 mg/dL (ref 0.44–1.00)
GFR, Estimated: >60 mL/min (ref >60)

## 2023-05-07 LAB — HEMOGLOBIN A1C
Hgb A1c MFr Bld: 5.9 % — ABNORMAL HIGH (ref 4.8–5.6)
Mean Plasma Glucose: 122.63 mg/dL

## 2023-05-07 MED ORDER — IOHEXOL 350 MG/ML SOLN
75.0000 mL | Freq: Once | INTRAVENOUS | Status: AC | PRN
  Administered 2023-05-07: 75 mL via INTRAVENOUS

## 2023-05-07 MED ORDER — ENOXAPARIN SODIUM 40 MG/0.4ML IJ SOSY
40.0000 mg | PREFILLED_SYRINGE | INTRAMUSCULAR | Status: DC
  Administered 2023-05-07: 40 mg via SUBCUTANEOUS
  Filled 2023-05-07: qty 0.4

## 2023-05-07 MED ORDER — ANASTROZOLE 1 MG PO TABS
1.0000 mg | ORAL_TABLET | Freq: Every day | ORAL | Status: DC
  Administered 2023-05-08: 1 mg via ORAL
  Filled 2023-05-07: qty 1

## 2023-05-07 MED ORDER — CLOPIDOGREL BISULFATE 75 MG PO TABS
75.0000 mg | ORAL_TABLET | Freq: Every day | ORAL | Status: DC
  Administered 2023-05-08: 75 mg via ORAL
  Filled 2023-05-07: qty 1

## 2023-05-07 MED ORDER — CLOPIDOGREL BISULFATE 300 MG PO TABS
300.0000 mg | ORAL_TABLET | Freq: Once | ORAL | Status: AC
  Administered 2023-05-07: 300 mg via ORAL
  Filled 2023-05-07: qty 1

## 2023-05-07 MED ORDER — ASPIRIN 81 MG PO TBEC
81.0000 mg | DELAYED_RELEASE_TABLET | Freq: Every day | ORAL | Status: DC
  Administered 2023-05-07 – 2023-05-08 (×2): 81 mg via ORAL
  Filled 2023-05-07 (×2): qty 1

## 2023-05-07 MED ORDER — CYCLOSPORINE 0.05 % OP EMUL
1.0000 [drp] | Freq: Two times a day (BID) | OPHTHALMIC | Status: DC
  Administered 2023-05-07 – 2023-05-08 (×2): 1 [drp] via OPHTHALMIC
  Filled 2023-05-07 (×4): qty 30

## 2023-05-07 MED ORDER — LEVOTHYROXINE SODIUM 88 MCG PO TABS
88.0000 ug | ORAL_TABLET | Freq: Every day | ORAL | Status: DC
  Administered 2023-05-08: 88 ug via ORAL
  Filled 2023-05-07: qty 1

## 2023-05-07 MED ORDER — LORAZEPAM 2 MG/ML IJ SOLN
1.0000 mg | Freq: Once | INTRAMUSCULAR | Status: AC
Start: 2023-05-07 — End: 2023-05-07
  Administered 2023-05-07: 1 mg via INTRAVENOUS
  Filled 2023-05-07: qty 1

## 2023-05-07 MED ORDER — LEVOTHYROXINE SODIUM 100 MCG PO TABS: 100.0000 ug | ORAL_TABLET | Freq: Every day | ORAL | Status: DC

## 2023-05-07 MED ORDER — STROKE: EARLY STAGES OF RECOVERY BOOK
Freq: Once | Status: AC
  Filled 2023-05-07: qty 1

## 2023-05-07 MED ORDER — SENNOSIDES-DOCUSATE SODIUM 8.6-50 MG PO TABS: 1.0000 | ORAL_TABLET | Freq: Every evening | ORAL | Status: DC | PRN

## 2023-05-07 MED ORDER — VITAMIN D 25 MCG (1000 UNIT) PO TABS
1000.0000 [IU] | ORAL_TABLET | Freq: Every day | ORAL | Status: DC
  Administered 2023-05-07 – 2023-05-08 (×2): 1000 [IU] via ORAL
  Filled 2023-05-07 (×2): qty 1

## 2023-05-07 MED ORDER — ACETAMINOPHEN 160 MG/5ML PO SOLN: 650.0000 mg | ORAL | Status: DC | PRN

## 2023-05-07 MED ORDER — ACETAMINOPHEN 325 MG PO TABS: 650.0000 mg | ORAL_TABLET | ORAL | Status: DC | PRN

## 2023-05-07 MED ORDER — ACETAMINOPHEN 650 MG RE SUPP: 650.0000 mg | RECTAL | Status: DC | PRN

## 2023-05-07 NOTE — ED Triage Notes (Signed)
Patient states she was weed eating in her yard kinda on an incline and left leg starting giving away on her  was able to hold herself up with the weedeater and get back to the house. All weakness resolved at present.

## 2023-05-07 NOTE — ED Notes (Signed)
ED TO INPATIENT HANDOFF REPORT  ED Nurse Name and Phone #:  [phone]  S Name/Age/Gender Lydia Roberts 77 y.o. female Room/Bed: 037C/037C  Code Status   Code Status: Full Code  Home/SNF/Other Home Patient oriented to: self, place, time, and situation Is this baseline? Yes   Triage Complete: Triage complete  Chief Complaint TIA (transient ischemic attack) [G45.9]  Triage Note Patient states she was weed eating in her yard kinda on an incline and left leg starting giving away on her  was able to hold herself up with the weedeater and get back to the house. All weakness resolved at present.    Allergies Allergies  Allergen Reactions   Elemental Sulfur Other (See Comments)    Headache    Codeine Palpitations    Hallucinations     Level of Care/Admitting Diagnosis ED Disposition     ED Disposition  Admit   Condition  --   Comment  Hospital Area: [facility] [[id number]]  Level of Care: Med-Surg [16]  May place patient in observation at [facility] or [facility] if equivalent level of care is available:: No  Covid Evaluation: Asymptomatic - no recent exposure (last 10 days) testing not required  Diagnosis: TIA (transient ischemic attack) [413244]  Admitting Physician: Hughie Closs [[id number]]  Attending Physician: Hughie Closs [phone]          B Medical/Surgery History Past Medical History:  Diagnosis Date   Anxiety    Arthritis    Breast cancer (HCC) 1993   Cancer (HCC)    Osteoporosis    Recurrent UTI    Thyroid disease    Past Surgical History:  Procedure Laterality Date   BREAST SURGERY  1993   rt lump-axillary node dissection   COLONOSCOPY     MASS EXCISION Right 10/07/2020   Procedure: EXCISION RIGHT CHEST WALL MASS;  Surgeon: Harriette Bouillon, MD;  Location: [facility];  Service: General;  Laterality: Right;   SIMPLE MASTECTOMY WITH AXILLARY SENTINEL NODE BIOPSY Right 07/25/2014   Procedure: RIGHT  SIMPLE MASTECTOMY;  Surgeon: Harriette Bouillon, MD;  Location: [facility];  Service: General;  Laterality: Right;   TONSILLECTOMY     TUBAL LIGATION       A IV Location/Drains/Wounds Patient Lines/Drains/Airways Status     Active Line/Drains/Airways     Name Placement date Placement time Site Days   Peripheral IV 05/07/23 20 G Left Antecubital 05/07/23  1323  Antecubital  less than 1   Closed System Drain 1 Right Breast Bulb (JP) 19 Fr. 07/25/14  1431  Breast  3208   Incision (Closed) 07/25/14 Breast Right 07/25/14  1420  -- 3208   Incision (Closed) 10/07/20 Chest Right 10/07/20  1332  -- 942            Intake/Output Last 24 hours No intake or output data in the 24 hours ending 05/07/23 1740  Labs/Imaging Results for orders placed or performed during the hospital encounter of 05/07/23 (from the past 48 hour(s))  Comprehensive metabolic panel     Status: Abnormal   Collection Time: 05/07/23  1:19 PM  Result Value Ref Range   Sodium 135 135 - 145 mmol/L   Potassium 4.2 3.5 - 5.1 mmol/L   Chloride 98 98 - 111 mmol/L   CO2 25 22 - 32 mmol/L   Glucose, Bld 104 (H) 70 - 99 mg/dL    Comment: Glucose reference range applies only to samples taken after fasting for at  least 8 hours.   BUN 14 8 - 23 mg/dL   Creatinine, Ser 1.61 0.44 - 1.00 mg/dL   Calcium 9.5 8.9 - 09.6 mg/dL   Total Protein 6.9 6.5 - 8.1 g/dL   Albumin 3.9 3.5 - 5.0 g/dL   AST 25 15 - 41 U/L   ALT 15 0 - 44 U/L   Alkaline Phosphatase 45 38 - 126 U/L   Total Bilirubin 1.3 (H) 0.3 - 1.2 mg/dL   GFR, Estimated 59 (L) >60 mL/min    Comment: (NOTE) Calculated using the CKD-EPI Creatinine Equation (2021)    Anion gap 12 5 - 15    Comment: Performed at [facility], [address] [place], [place] [address]  CBC with Differential     Status: None   Collection Time: 05/07/23  1:19 PM  Result Value Ref Range   WBC 4.4 4.0 - 10.5 K/uL   RBC 4.26 3.87 - 5.11 MIL/uL   Hemoglobin 12.1 12.0 - 15.0  g/dL   HCT 98.1 19.1 - 47.8 %   MCV 88.3 80.0 - 100.0 fL   MCH 28.4 26.0 - 34.0 pg   MCHC 32.2 30.0 - 36.0 g/dL   RDW 29.5 62.1 - 30.8 %   Platelets 188 150 - 400 K/uL   nRBC 0.0 0.0 - 0.2 %   Neutrophils Relative % 59 %   Neutro Abs 2.6 1.7 - 7.7 K/uL   Lymphocytes Relative 29 %   Lymphs Abs 1.3 0.7 - 4.0 K/uL   Monocytes Relative 10 %   Monocytes Absolute 0.4 0.1 - 1.0 K/uL   Eosinophils Relative 1 %   Eosinophils Absolute 0.0 0.0 - 0.5 K/uL   Basophils Relative 1 %   Basophils Absolute 0.0 0.0 - 0.1 K/uL   Immature Granulocytes 0 %   Abs Immature Granulocytes 0.01 0.00 - 0.07 K/uL    Comment: Performed at [facility], [address] [place], [place] [address]  Protime-INR     Status: None   Collection Time: 05/07/23  1:19 PM  Result Value Ref Range   Prothrombin Time 13.7 11.4 - 15.2 seconds   INR 1.0 0.8 - 1.2    Comment: (NOTE) INR goal varies based on device and disease states. Performed at [facility], [address] [place], [place] [address]   Hemoglobin A1c     Status: Abnormal   Collection Time: 05/07/23  1:19 PM  Result Value Ref Range   Hgb A1c MFr Bld 5.9 (H) 4.8 - 5.6 %    Comment: (NOTE) Pre diabetes:          5.7%-6.4%  Diabetes:              >6.4%  Glycemic control for   <7.0% adults with diabetes    Mean Plasma Glucose 122.63 mg/dL    Comment: Performed at [facility], [address] [place], [place] [address]  CBC     Status: Abnormal   Collection Time: 05/07/23  3:31 PM  Result Value Ref Range   WBC 3.6 (L) 4.0 - 10.5 K/uL   RBC 3.91 3.87 - 5.11 MIL/uL   Hemoglobin 11.0 (L) 12.0 - 15.0 g/dL   HCT 32.4 (L) 40.1 - 02.7 %   MCV 90.3 80.0 - 100.0 fL   MCH 28.1 26.0 - 34.0 pg   MCHC 31.2 30.0 - 36.0 g/dL   RDW 25.3 66.4 - 40.3 %   Platelets 161 150 -  400 K/uL   nRBC 0.0 0.0 - 0.2 %    Comment: Performed at [facility], [address] [place], [place] [address]  Creatinine, serum     Status: None   Collection  Time: 05/07/23  3:31 PM  Result Value Ref Range   Creatinine, Ser 0.88 0.44 - 1.00 mg/dL   GFR, Estimated >60 >45 mL/min    Comment: (NOTE) Calculated using the CKD-EPI Creatinine Equation (2021) Performed at [facility], [address], [place], [place] [address]    MR BRAIN WO CONTRAST  Result Date: 05/07/2023 CLINICAL DATA:  Headache EXAM: MRI HEAD WITHOUT CONTRAST TECHNIQUE: Multiplanar, multiecho pulse sequences of the brain and surrounding structures were obtained without intravenous contrast. COMPARISON:  MR Head 11/10/21 FINDINGS: Brain: No acute infarction, hemorrhage, hydrocephalus, extra-axial collection or mass lesion. Scattered subcortical and periventricular T2/FLAIR hyperintense lesions appear unchanged from prior exam, and likely represent sequela of chronic microvascular ischemic change. Vascular: Normal flow voids. Skull and upper cervical spine: Normal marrow signal. Sinuses/Orbits: Trace left mastoid effusion. No middle ear effusion. Paranasal sinuses are clear. Bilateral lens replacement. Orbits are otherwise unremarkable. Other: None. IMPRESSION: No acute intracranial abnormality. Electronically Signed   By: Lorenza Cambridge M.D.   On: 05/07/2023 15:32   CT ANGIO HEAD NECK W WO CM  Result Date: 05/07/2023 CLINICAL DATA:  Stroke/TIA, determine embolic source EXAM: CT ANGIOGRAPHY HEAD AND NECK WITH AND WITHOUT CONTRAST TECHNIQUE: Multidetector CT imaging of the head and neck was performed using the standard protocol during bolus administration of intravenous contrast. Multiplanar CT image reconstructions and MIPs were obtained to evaluate the vascular anatomy. Carotid stenosis measurements (when applicable) are obtained utilizing NASCET criteria, using the distal internal carotid diameter as the denominator. RADIATION DOSE REDUCTION: This exam was performed according to the departmental dose-optimization program which includes automated exposure control, adjustment of the mA  and/or kV according to patient size and/or use of iterative reconstruction technique. CONTRAST:  75mL OMNIPAQUE IOHEXOL 350 MG/ML SOLN COMPARISON:  MR Head 11/10/21 FINDINGS: CT HEAD FINDINGS Brain: No evidence of acute infarction, hemorrhage, hydrocephalus, extra-axial collection or mass lesion/mass effect. Vascular: No hyperdense vessel or unexpected calcification. Skull: Normal. Negative for fracture or focal lesion. Sinuses/Orbits: No acute finding.  Bilateral lens replacement Other: None. Review of the MIP images confirms the above findings CTA NECK FINDINGS Aortic arch: Standard branching. Imaged portion shows no evidence of aneurysm or dissection. No significant stenosis of the major arch vessel origins. Right carotid system: No evidence of dissection, stenosis (50% or greater), or occlusion. Left carotid system: No evidence of dissection, stenosis (50% or greater), or occlusion. Vertebral arteries: Codominant. No evidence of dissection, stenosis (50% or greater), or occlusion. Skeleton: Negative. 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 Recommend correlation with biochemical markers. Upper chest: Negative. Review of the MIP images confirms the above findings CTA HEAD FINDINGS Anterior circulation: No significant stenosis, proximal occlusion, aneurysm, or vascular malformation. Posterior circulation: No significant stenosis, proximal occlusion, aneurysm, or vascular malformation. Venous sinuses: As permitted by contrast timing, patent. Anatomic variants: None Review of the MIP images confirms the above findings IMPRESSION: 1. No acute intracranial abnormality. 2. No intracranial large vessel occlusion or significant stenosis. 3. No hemodynamically significant stenosis in the neck. 4. 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 Recommend correlation with  biochemical markers. Electronically Signed   By: Lorenza Cambridge M.D.   On: 05/07/2023 15:09    Pending Labs Unresulted Labs (From admission, onward)     Start     Ordered   05/14/23 0500  Creatinine, serum  (enoxaparin (LOVENOX)    CrCl >/= 30 ml/min)  Weekly,   R     Comments: while on enoxaparin therapy    05/07/23 1436   05/08/23 0500  Lipid panel  (Labs)  Tomorrow morning,   R       Comments: Fasting    05/07/23 1436            Vitals/Pain Today's Vitals   05/07/23 1400 05/07/23 1430 05/07/23 1600 05/07/23 1632  BP: 132/74 (!) 144/51 138/69   Pulse: 61 (!) 55 76   Resp: 16 12 15    Temp:    98.4 F (36.9 C)  TempSrc:    Oral  SpO2: 100% 100% 99%   Weight:      Height:      PainSc:        Isolation Precautions No active isolations  Medications Medications  anastrozole (ARIMIDEX) tablet 1 mg (has no administration in time range)  cholecalciferol (VITAMIN D3) 25 MCG (1000 UNIT) tablet 1,000 Units (1,000 Units Oral Given 05/07/23 1622)  cycloSPORINE (RESTASIS) 0.05 % ophthalmic emulsion 1 drop (has no administration in time range)   stroke: early stages of recovery book (has no administration in time range)  acetaminophen (TYLENOL) tablet 650 mg (has no administration in time range)    Or  acetaminophen (TYLENOL) 160 MG/5ML solution 650 mg (has no administration in time range)    Or  acetaminophen (TYLENOL) suppository 650 mg (has no administration in time range)  senna-docusate (Senokot-S) tablet 1 tablet (has no administration in time range)  enoxaparin (LOVENOX) injection 40 mg (has no administration in time range)  levothyroxine (SYNTHROID) tablet 88 mcg (has no administration in time range)  LORazepam (ATIVAN) injection 1 mg (1 mg Intravenous Given 05/07/23 1442)  iohexol (OMNIPAQUE) 350 MG/ML injection 75 mL (75 mLs Intravenous Contrast Given 05/07/23 1455)    Mobility walks     Focused  Assessments    R Recommendations: See Admitting Provider Note  Report given to:   Additional Notes:

## 2023-05-07 NOTE — H&P (Signed)
History and Physical    Lydia Roberts WJX:[id number] DOB: 11-Jul-1946 DOA: 05/07/2023  PCP: Irena Reichmann, DO  Patient coming from: Home  I have personally briefly reviewed patient's old medical records in [facility]  Chief Complaint: left leg weakness  HPI: Lydia Roberts is a 77 y.o. female with medical history significant of right-sided breast cancer, osteoporosis presented to ED with an episode of left leg weakness.  According to patient, she was doing some yard work outside the house and suddenly she noticed that her leg gave up.  This lasted only for 5 minutes.  She denies any other symptoms or recurrence of sx.   ED Course: Upon arrival to ED, she was hemodynamically stable.  Slightly bradycardic.  Her symptoms had already resolved before coming to the ED.  CT head was not having completed and hospitalist were called to admit for possible TIA.  I was told by EDP that he had already spoken to Dr. Amada Jupiter of neurology who had recommended admission for observation for TIA workup.   Review of Systems: As per HPI otherwise negative.    Past Medical History:  Diagnosis Date   Anxiety    Arthritis    Breast cancer (HCC) 1993   Cancer (HCC)    Osteoporosis    Recurrent UTI    Thyroid disease     Past Surgical History:  Procedure Laterality Date   BREAST SURGERY  1993   rt lump-axillary node dissection   COLONOSCOPY     MASS EXCISION Right 10/07/2020   Procedure: EXCISION RIGHT CHEST WALL MASS;  Surgeon: Harriette Bouillon, MD;  Location: [facility];  Service: General;  Laterality: Right;   SIMPLE MASTECTOMY WITH AXILLARY SENTINEL NODE BIOPSY Right 07/25/2014   Procedure: RIGHT SIMPLE MASTECTOMY;  Surgeon: Harriette Bouillon, MD;  Location: [facility];  Service: General;  Laterality: Right;   TONSILLECTOMY     TUBAL LIGATION       reports that she has never smoked. She has never used smokeless tobacco. She reports that she does not drink alcohol  and does not use drugs.  Allergies  Allergen Reactions   Elemental Sulfur Other (See Comments)    Headache    Codeine Palpitations    Hallucinations     Family History  Problem Relation Age of Onset   Diabetes Sister    Diabetes Brother    Diabetes Brother    Thyroid disease Daughter    Kidney cancer Father 29       deceased 62   Breast cancer Paternal Aunt        Dx 37s; died at 66; Had 7 sisters who are cancer-free   Breast cancer Cousin     Prior to Admission medications   Medication Sig Start Date End Date Taking? Authorizing Provider  anastrozole (ARIMIDEX) 1 MG tablet TAKE 1 TABLET BY MOUTH DAILY 12/23/22   Serena Croissant, MD  cholecalciferol (VITAMIN D) 1000 UNITS tablet Take 1,000 Units by mouth daily.    [provider]  ezetimibe (ZETIA) 10 MG tablet Take 10 mg by mouth daily.    [provider]  ibandronate (BONIVA) 150 MG tablet TAKE 1 TAB BY MOUTH MONTHLY ON  AN EMPTY STOMACH WITH FULL GLASS WATER 1/2 HOUR BEFORE FIRST FOOD DRINK OR MEDS. STAY UPRIGHT FOR  1/2 HOUR 01/03/23   Serena Croissant, MD  levothyroxine (SYNTHROID, LEVOTHROID) 125 MCG tablet Take 100 mcg by mouth daily.    [provider]  RESTASIS 0.05 % ophthalmic emulsion  10/01/20   [provider]    Physical Exam: Vitals:   05/07/23 1245 05/07/23 1320 05/07/23 1330 05/07/23 1400  BP: (!) 148/53 132/61 (!) 146/70 132/74  Pulse: (!) 53 (!) 58 72 61  Resp: 13 19 14 16   Temp:      TempSrc:      SpO2: 100% 100% 100% 100%  Weight:      Height:        Constitutional: NAD, calm, comfortable Vitals:   05/07/23 1245 05/07/23 1320 05/07/23 1330 05/07/23 1400  BP: (!) 148/53 132/61 (!) 146/70 132/74  Pulse: (!) 53 (!) 58 72 61  Resp: 13 19 14 16   Temp:      TempSrc:      SpO2: 100% 100% 100% 100%  Weight:      Height:       Eyes: PERRL, lids and conjunctivae normal ENMT: Mucous membranes are moist. Posterior pharynx clear of any exudate or lesions.Normal  dentition.  Neck: normal, supple, no masses, no thyromegaly Respiratory: clear to auscultation bilaterally, no wheezing, no crackles. Normal respiratory effort. No accessory muscle use.  Cardiovascular: Regular rate and rhythm, no murmurs / rubs / gallops. No extremity edema. 2+ pedal pulses. No carotid bruits.  Abdomen: no tenderness, no masses palpated. No hepatosplenomegaly. Bowel sounds positive.  Musculoskeletal: no clubbing / cyanosis. No joint deformity upper and lower extremities. Good ROM, no contractures. Normal muscle tone.  Skin: no rashes, lesions, ulcers. No induration Neurologic: CN 2-12 grossly intact. Sensation intact, DTR normal. Strength 5/5 in all 4.  Psychiatric: Normal judgment and insight. Alert and oriented x 3. Normal mood.    Labs on Admission: I have personally reviewed following labs and imaging studies  CBC: Recent Labs  Lab 05/07/23 1319  WBC 4.4  NEUTROABS 2.6  HGB 12.1  HCT 37.6  MCV 88.3  PLT 188   Basic Metabolic Panel: Recent Labs  Lab 05/07/23 1319  NA 135  K 4.2  CL 98  CO2 25  GLUCOSE 104*  BUN 14  CREATININE 0.98  CALCIUM 9.5   GFR: Estimated Creatinine Clearance: 43.3 mL/min (by C-G formula based on SCr of 0.98 mg/dL). Liver Function Tests: Recent Labs  Lab 05/07/23 1319  AST 25  ALT 15  ALKPHOS 45  BILITOT 1.3*  PROT 6.9  ALBUMIN 3.9   No results for input(s): "LIPASE", "AMYLASE" in the last 168 hours. No results for input(s): "AMMONIA" in the last 168 hours. Coagulation Profile: Recent Labs  Lab 05/07/23 1319  INR 1.0   Cardiac Enzymes: No results for input(s): "CKTOTAL", "CKMB", "CKMBINDEX", "TROPONINI" in the last 168 hours. BNP (last 3 results) No results for input(s): "PROBNP" in the last 8760 hours. HbA1C: No results for input(s): "HGBA1C" in the last 72 hours. CBG: No results for input(s): "GLUCAP" in the last 168 hours. Lipid Profile: No results for input(s): "CHOL", "HDL", "LDLCALC", "TRIG",  "CHOLHDL", "LDLDIRECT" in the last 72 hours. Thyroid Function Tests: No results for input(s): "TSH", "T4TOTAL", "FREET4", "T3FREE", "THYROIDAB" in the last 72 hours. Anemia Panel: No results for input(s): "VITAMINB12", "FOLATE", "FERRITIN", "TIBC", "IRON", "RETICCTPCT" in the last 72 hours. Urine analysis:    Component Value Date/Time   COLORURINE COLORLESS (A) 11/10/2021 1531   APPEARANCEUR CLEAR 11/10/2021 1531   LABSPEC 1.002 (L) 11/10/2021 1531   PHURINE 7.0 11/10/2021 1531   GLUCOSEU NEGATIVE 11/10/2021 1531   HGBUR SMALL (A) 11/10/2021 1531   BILIRUBINUR NEGATIVE 11/10/2021 1531   BILIRUBINUR  negative 06/28/2020 1216   BILIRUBINUR neg 10/08/2012 1324   KETONESUR NEGATIVE 11/10/2021 1531   PROTEINUR NEGATIVE 11/10/2021 1531   UROBILINOGEN 0.2 06/28/2020 1216   UROBILINOGEN 0.2 01/23/2015 1047   NITRITE NEGATIVE 11/10/2021 1531   LEUKOCYTESUR NEGATIVE 11/10/2021 1531    Radiological Exams on Admission: No results found.  EKG: Independently reviewed.  Sinus bradycardia.  Assessment/Plan Principal Problem:   TIA (transient ischemic attack)   Strokelike symptoms/?  TIA: -admit forTelemetry monitoring -Stroke protocol -Allow for permissive hypertension for the first 24-48h - only treat PRN if SBP >220 mmHg or diastolic blood pressure >120. Blood pressures can be gradually normalized to SBP<140 upon discharge. -MRI brain without contrast and MRA head and neck as well as CT angiogram of head and neck are both ordered but have not been completed as of this dictation time. -Maintain Euthermia.  -ASA not given.  Will defer to neurology. -Echocardiogram to- rule out PFO -Lipid Panel, TSH and A1C -Frequent neuro checks -Atorvastatin PO within 24 hrs.  -Risk factor modification -Consult Neurology -PT/OT eval, Speech consult    DVT prophylaxis: enoxaparin (LOVENOX) injection 40 mg Start: 05/07/23 1445 Code Status: Full code Family Communication: daughter present at  bedside.  Plan of care discussed with patient in length and he verbalized understanding and agreed with it. Disposition Plan: Likely dc tomorrow  Consults called: neurology   Hughie Closs MD [facility]  *Please note that this is a verbal dictation therefore any spelling or grammatical errors are due to the "[employer]agon [employer] One" system interpretation.  Please page via Amion and do not message via secure chat for urgent patient care matters. Secure chat can be used for non urgent patient care matters. 05/07/2023, 2:37 PM  To contact the attending provider between 7A-7P or the covering provider during after hours 7P-7A, please log into the web site [email or web address]

## 2023-05-07 NOTE — ED Provider Notes (Signed)
Lydia Roberts EMERGENCY DEPARTMENT AT [facility] Provider Note  CSN: [id number] Arrival date & time: 05/07/23 1218  Chief Complaint(s) Extremity Weakness  HPI Lydia Roberts is a 77 y.o. female prior history of breast cancer presenting to the emergency department with leg weakness.  Patient reports that she was doing lawn work, suddenly felt that her left leg was weak.  Denies any pain in her leg, numbness or tingling, just felt like she could not use it.  She was able to use the lawn tool as a crutch to get back inside, and within 4 to 5 minutes the patient's symptoms had subsided.  She denies any other symptoms during this period such as visual disturbances, speech changes, facial droop, numbness or tingling, headaches, other weakness, fall, syncope, or any other symptoms.   Past Medical History Past Medical History:  Diagnosis Date   Anxiety    Arthritis    Breast cancer (HCC) 1993   Cancer (HCC)    Osteoporosis    Recurrent UTI    Thyroid disease    Patient Active Problem List   Diagnosis Date Noted   TIA (transient ischemic attack) 05/07/2023   Pain in left shin 11/08/2016   Pain in left foot 11/08/2016   Breast cancer of upper-inner quadrant of right female breast (HCC) 06/17/2014   History of breast cancer 01/17/2014   Osteoporosis 01/17/2014   Home Medication(s) Prior to Admission medications   Medication Sig Start Date End Date Taking? Authorizing Provider  anastrozole (ARIMIDEX) 1 MG tablet TAKE 1 TABLET BY MOUTH DAILY 12/23/22   Serena Croissant, MD  cholecalciferol (VITAMIN D) 1000 UNITS tablet Take 1,000 Units by mouth daily.    [provider]  ezetimibe (ZETIA) 10 MG tablet Take 10 mg by mouth daily.    [provider]  ibandronate (BONIVA) 150 MG tablet TAKE 1 TAB BY MOUTH MONTHLY ON  AN EMPTY STOMACH WITH FULL GLASS WATER 1/2 HOUR BEFORE FIRST FOOD DRINK OR MEDS. STAY UPRIGHT FOR  1/2 HOUR 01/03/23   Serena Croissant, MD  levothyroxine  (SYNTHROID, LEVOTHROID) 125 MCG tablet Take 100 mcg by mouth daily.    [provider]  RESTASIS 0.05 % ophthalmic emulsion  10/01/20   [provider]                                                                                                                                    Past Surgical History Past Surgical History:  Procedure Laterality Date   BREAST SURGERY  1993   rt lump-axillary node dissection   COLONOSCOPY     MASS EXCISION Right 10/07/2020   Procedure: EXCISION RIGHT CHEST WALL MASS;  Surgeon: Harriette Bouillon, MD;  Location: [facility];  Service: General;  Laterality: Right;   SIMPLE MASTECTOMY WITH AXILLARY SENTINEL NODE BIOPSY Right 07/25/2014   Procedure: RIGHT SIMPLE MASTECTOMY;  Surgeon: Harriette Bouillon, MD;  Location: [facility];  Service: General;  Laterality: Right;   TONSILLECTOMY     TUBAL LIGATION     Family History Family History  Problem Relation Age of Onset   Diabetes Sister    Diabetes Brother    Diabetes Brother    Thyroid disease Daughter    Kidney cancer Father 74       deceased 59   Breast cancer Paternal Aunt        Dx 34s; died at 20; Had 7 sisters who are cancer-free   Breast cancer Cousin     Social History Social History   Tobacco Use   Smoking status: Never   Smokeless tobacco: Never  Vaping Use   Vaping status: Never Used  Substance Use Topics   Alcohol use: No    Alcohol/week: 0.0 standard drinks of alcohol   Drug use: No   Allergies Elemental sulfur and Codeine  Review of Systems Review of Systems  All other systems reviewed and are negative.   Physical Exam Vital Signs  I have reviewed the triage vital signs BP (!) 144/51   Pulse (!) 55   Temp 98.3 F (36.8 C) (Oral)   Resp 12   Ht 5\' 5"  (1.651 m)   Wt 64.9 kg   SpO2 100%   BMI 23.81 kg/m  Physical Exam Vitals and nursing note reviewed.  Constitutional:      General: She is not in acute distress.     Appearance: She is well-developed.  HENT:     Head: Normocephalic and atraumatic.     Mouth/Throat:     Mouth: Mucous membranes are moist.  Eyes:     Pupils: Pupils are equal, round, and reactive to light.  Cardiovascular:     Rate and Rhythm: Normal rate and regular rhythm.     Pulses:          Dorsalis pedis pulses are 2+ on the right side and 2+ on the left side.     Heart sounds: No murmur heard. Pulmonary:     Effort: Pulmonary effort is normal. No respiratory distress.     Breath sounds: Normal breath sounds.  Abdominal:     General: Abdomen is flat.     Palpations: Abdomen is soft.     Tenderness: There is no abdominal tenderness.  Musculoskeletal:        General: No tenderness.     Right lower leg: No edema.     Left lower leg: No edema.     Comments: Less than 2-second capillary refill  Skin:    General: Skin is warm and dry.  Neurological:     General: No focal deficit present.     Mental Status: She is alert. Mental status is at baseline.     Comments: Cranial nerves II through XII intact, strength 5 out of 5 in the bilateral upper and lower extremities, no sensory deficit to light touch, no dysmetria on finger-nose-finger testing  Psychiatric:        Mood and Affect: Mood normal.        Behavior: Behavior normal.     ED Results and Treatments Labs (all labs ordered are listed, but only abnormal results are displayed) Labs Reviewed  COMPREHENSIVE METABOLIC PANEL - Abnormal; Notable for the following components:      Result Value   Glucose, Bld 104 (*)    Total Bilirubin 1.3 (*)    GFR, Estimated 59 (*)    All other components within normal  limits  HEMOGLOBIN A1C - Abnormal; Notable for the following components:   Hgb A1c MFr Bld 5.9 (*)    All other components within normal limits  CBC WITH DIFFERENTIAL/PLATELET  PROTIME-INR  CBC  CREATININE, SERUM                                                                                                                           Radiology CT ANGIO HEAD NECK W WO CM  Result Date: 05/07/2023 CLINICAL DATA:  Stroke/TIA, determine embolic source EXAM: CT ANGIOGRAPHY HEAD AND NECK WITH AND WITHOUT CONTRAST TECHNIQUE: Multidetector CT imaging of the head and neck was performed using the standard protocol during bolus administration of intravenous contrast. Multiplanar CT image reconstructions and MIPs were obtained to evaluate the vascular anatomy. Carotid stenosis measurements (when applicable) are obtained utilizing NASCET criteria, using the distal internal carotid diameter as the denominator. RADIATION DOSE REDUCTION: This exam was performed according to the departmental dose-optimization program which includes automated exposure control, adjustment of the mA and/or kV according to patient size and/or use of iterative reconstruction technique. CONTRAST:  75mL OMNIPAQUE IOHEXOL 350 MG/ML SOLN COMPARISON:  MR Head 11/10/21 FINDINGS: CT HEAD FINDINGS Brain: No evidence of acute infarction, hemorrhage, hydrocephalus, extra-axial collection or mass lesion/mass effect. Vascular: No hyperdense vessel or unexpected calcification. Skull: Normal. Negative for fracture or focal lesion. Sinuses/Orbits: No acute finding.  Bilateral lens replacement Other: None. Review of the MIP images confirms the above findings CTA NECK FINDINGS Aortic arch: Standard branching. Imaged portion shows no evidence of aneurysm or dissection. No significant stenosis of the major arch vessel origins. Right carotid system: No evidence of dissection, stenosis (50% or greater), or occlusion. Left carotid system: No evidence of dissection, stenosis (50% or greater), or occlusion. Vertebral arteries: Codominant. No evidence of dissection, stenosis (50% or greater), or occlusion. Skeleton: Negative. 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 Recommend  correlation with biochemical markers. Upper chest: Negative. Review of the MIP images confirms the above findings CTA HEAD FINDINGS Anterior circulation: No significant stenosis, proximal occlusion, aneurysm, or vascular malformation. Posterior circulation: No significant stenosis, proximal occlusion, aneurysm, or vascular malformation. Venous sinuses: As permitted by contrast timing, patent. Anatomic variants: None Review of the MIP images confirms the above findings IMPRESSION: 1. No acute intracranial abnormality. 2. No intracranial large vessel occlusion or significant stenosis. 3. No hemodynamically significant stenosis in the neck. 4. 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 Recommend correlation with biochemical markers. Electronically Signed   By: Lorenza Cambridge M.D.   On: 05/07/2023 15:09    Pertinent labs & imaging results that were available during my care of the patient were reviewed by me and considered in my medical decision making (see MDM for details).  Medications Ordered in ED Medications  anastrozole (ARIMIDEX) tablet 1  mg (has no administration in time range)  cholecalciferol (VITAMIN D3) 25 MCG (1000 UNIT) tablet 1,000 Units (has no administration in time range)  levothyroxine (SYNTHROID) tablet 100 mcg (has no administration in time range)  cycloSPORINE (RESTASIS) 0.05 % ophthalmic emulsion 1 drop (has no administration in time range)   stroke: early stages of recovery book (has no administration in time range)  acetaminophen (TYLENOL) tablet 650 mg (has no administration in time range)    Or  acetaminophen (TYLENOL) 160 MG/5ML solution 650 mg (has no administration in time range)    Or  acetaminophen (TYLENOL) suppository 650 mg (has no administration in time range)  senna-docusate (Senokot-S) tablet 1 tablet (has no administration in time range)  enoxaparin (LOVENOX) injection 40 mg  (has no administration in time range)  LORazepam (ATIVAN) injection 1 mg (1 mg Intravenous Given 05/07/23 1442)  iohexol (OMNIPAQUE) 350 MG/ML injection 75 mL (75 mLs Intravenous Contrast Given 05/07/23 1455)                                                                                                                                     Procedures Procedures  (including critical care time)  Medical Decision Making / ED Course   MDM:  77 year old female presenting to the emergency department with episode of left leg weakness.  Patient overall well-appearing, neurologic exam is normal at this time.  Symptoms are somewhat concerning for TIA.  She did not have any other focal neurologic symptoms during this episode.  Symptoms were very brief.  However she clearly describes that her leg was weak and she had trouble moving it.  This was painless, doubt peripheral nerve process, acute DVT or arterial injury.  Discussed symptoms with Dr. Amada Jupiter with neurology, recommends admission even if imaging is negative as the patient will likely need TIA workup given her symptoms.  Discussed with Dr. Jacqulyn Bath who will admit the patient for TIA workup.         Additional history obtained: -Additional history obtained from family -External records from outside source obtained and reviewed including: Chart review including previous notes, labs, imaging, consultation notes including ER visit for visual process 2023   Lab Tests: -I ordered, reviewed, and interpreted labs.   The pertinent results include:   Labs Reviewed  COMPREHENSIVE METABOLIC PANEL - Abnormal; Notable for the following components:      Result Value   Glucose, Bld 104 (*)    Total Bilirubin 1.3 (*)    GFR, Estimated 59 (*)    All other components within normal limits  HEMOGLOBIN A1C - Abnormal; Notable for the following components:   Hgb A1c MFr Bld 5.9 (*)    All other components within normal limits  CBC WITH  DIFFERENTIAL/PLATELET  PROTIME-INR  CBC  CREATININE, SERUM    Notable for mild elevated a1C  EKG   EKG Interpretation Date/Time:  Saturday May 07 2023 12:43:18 EDT Ventricular  Rate:  55 PR Interval:  190 QRS Duration:  81 QT Interval:  423 QTC Calculation: 405 R Axis:   85  Text Interpretation: Sinus rhythm Borderline right axis deviation Confirmed by Alvino Blood ([id number]) on 05/07/2023 1:11:44 PM         Imaging Studies ordered: I ordered imaging studies including CT head On my interpretation imaging demonstrates no acute bleeding I independently visualized and interpreted imaging. I agree with the radiologist interpretation   Medicines ordered and prescription drug management: Meds ordered this encounter  Medications   LORazepam (ATIVAN) injection 1 mg   anastrozole (ARIMIDEX) tablet 1 mg   cholecalciferol (VITAMIN D3) 25 MCG (1000 UNIT) tablet 1,000 Units   levothyroxine (SYNTHROID) tablet 100 mcg   cycloSPORINE (RESTASIS) 0.05 % ophthalmic emulsion 1 drop    stroke: early stages of recovery book   OR Linked Order Group    acetaminophen (TYLENOL) tablet 650 mg    acetaminophen (TYLENOL) 160 MG/5ML solution 650 mg    acetaminophen (TYLENOL) suppository 650 mg   senna-docusate (Senokot-S) tablet 1 tablet   enoxaparin (LOVENOX) injection 40 mg   iohexol (OMNIPAQUE) 350 MG/ML injection 75 mL    -I have reviewed the patients home medicines and have made adjustments as needed   Consultations Obtained: I requested consultation with the neurologist,  and discussed lab and imaging findings as well as pertinent plan - they recommend: TIA workup   Cardiac Monitoring: The patient was maintained on a cardiac monitor.  I personally viewed and interpreted the cardiac monitored which showed an underlying rhythm of: NSR  Social Determinants of Health:  Diagnosis or treatment significantly limited by social determinants of health: lives alone   Reevaluation: After  the interventions noted above, I reevaluated the patient and found that their symptoms have improved  Co morbidities that complicate the patient evaluation  Past Medical History:  Diagnosis Date   Anxiety    Arthritis    Breast cancer (HCC) 1993   Cancer (HCC)    Osteoporosis    Recurrent UTI    Thyroid disease       Dispostion: Disposition decision including need for hospitalization was considered, and patient admitted to the hospital.    Final Clinical Impression(s) / ED Diagnoses Final diagnoses:  Left leg weakness     This chart was dictated using voice recognition software.  Despite best efforts to proofread,  errors can occur which can change the documentation meaning.    Lonell Grandchild, MD 05/07/23 [phone]

## 2023-05-07 NOTE — Consult Note (Signed)
Neurology Consultation Reason for Consult: TIA Referring Physician: Dr. Eldridge Scot  CC: left leg weakness  History is obtained from:patient, family at bedside and chart review.   HPI: Lydia Roberts is a 77 y.o. female with PMH significant for breat cancer (R), osteoporosis who presented to ED c/o episode of left leg weakness while doing yard work. Symptoms lasted for 5-7 minutes per patient. On arrival to ED, her symptoms had already resolved. Neurology was consulted for possible TIA, and workup was ordered. MRI brain and CTA negative.  On exam, patient is sitting up in bed with daughter at bedside. She denies headache, dizziness, nausea, recent illness, recent falls. No focal weakness observed.    ROS: A 14 point ROS was performed and is negative except as noted in the HPI.   Past Medical History:  Diagnosis Date   Anxiety    Arthritis    Breast cancer (HCC) 1993   Cancer (HCC)    Osteoporosis    Recurrent UTI    Thyroid disease     Family History  Problem Relation Age of Onset   Diabetes Sister    Diabetes Brother    Diabetes Brother    Thyroid disease Daughter    Kidney cancer Father 61       deceased 22   Breast cancer Paternal Aunt        Dx 41s; died at 61; Had 7 sisters who are cancer-free   Breast cancer Cousin     Social History:  reports that she has never smoked. She has never used smokeless tobacco. She reports that she does not drink alcohol and does not use drugs.  Exam: Current vital signs: BP 132/74   Pulse 61   Temp 98.3 F (36.8 C) (Oral)   Resp 16   Ht 5\' 5"  (1.651 m)   Wt 64.9 kg   SpO2 100%   BMI 23.81 kg/m  Vital signs in last 24 hours: Temp:  [98.3 F (36.8 C)] 98.3 F (36.8 C) (08/03 1231) Pulse Rate:  [53-72] 61 (08/03 1400) Resp:  [13-19] 16 (08/03 1400) BP: (132-168)/(53-74) 132/74 (08/03 1400) SpO2:  [100 %] 100 % (08/03 1400) Weight:  [64.9 kg] 64.9 kg (08/03 1237)   Physical Exam  Appears well-developed and well-nourished.    Neuro: Mental Status: Patient is awake, alert, oriented to person, place, month, year, and situation. Patient is able to give a clear and coherent history. No signs of aphasia or neglect Cranial Nerves: II: Visual Fields are full. Pupils are equal, round, and reactive to light.   III,IV, VI: EOMI without ptosis or diploplia.  V: Facial sensation is symmetric to light touch VII: Facial movement is symmetric.  VIII: hearing is intact to voice X: Uvula elevates symmetrically XI: Shoulder shrug is symmetric. XII: tongue is midline without atrophy  Motor: Tone is normal. Bulk is normal. 5/5 strength was present in all four extremities.  Sensory: Sensation is symmetric to light touch and temperature in the arms and legs. Cerebellar: FNF intact bilaterally  Pertinent Labs: A1c: 5.9  I have reviewed the images obtained:  Impression: Lydia Roberts is a 77 y.o. female with PMH significant for breat cancer (R), osteoporosis who presented to ED c/o episode of left leg weakness while doing yard work. Symptoms lasted for 5-7 minutes per patient. On arrival to ED, her symptoms had already resolved. MRI brain and CTA negative. Presentation seems like a TIA, with the symptoms quickly resolving along with negative imaging.   Recommendations: -  Frequent Neuro checks per stroke unit protocol - TTE - Lipid panel - Statin - will be started if LDL>70 or otherwise medically indicated - Recommend close follow-up with PCP as patient's A1c is within pre-diabetic range.  - Antithrombotic -aspirin 81 mg and Plavix 75 mg after 300 mg load for 3 weeks followed by monotherapy - DVT ppx - lovenox - Smoking cessation - will counsel patient - SBP goal - <220, PRN labetalol if HR>60 and PRN Hydralazine if HR<60 - Telemetry monitoring for arrhythmia - 72h - Swallow screen - will be performed prior to PO intake - Stroke education - will be given - PT/OT/SLP - Dispo: Admit to floor for TIA work-up   Mauri Reading, NP  I have seen the patient reviewed the above note.  She had transient painless unilateral lower extremity weakness.  Given the transient painless nature, I think that TIA is by far the most likely etiology.  She has an ABCD2 score of four and I would favor inpatient evaluation for TIA.  Ritta Slot, MD [facility] [phone]  If 7pm- 7am, please page neurology on call as listed in AMION.

## 2023-05-08 ENCOUNTER — Observation Stay (HOSPITAL_BASED_OUTPATIENT_CLINIC_OR_DEPARTMENT_OTHER): Payer: Medicare Other

## 2023-05-08 DIAGNOSIS — R7303 Prediabetes: Secondary | ICD-10-CM | POA: Insufficient documentation

## 2023-05-08 DIAGNOSIS — G459 Transient cerebral ischemic attack, unspecified: Secondary | ICD-10-CM

## 2023-05-08 LAB — ECHOCARDIOGRAM COMPLETE
AR max vel: 1.84 cm2
AV Area VTI: 1.68 cm2
AV Area mean vel: 1.73 cm2
AV Mean grad: 1 mmHg
AV Peak grad: 2.4 mmHg
Ao pk vel: 0.78 m/s
Area-P 1/2: 3.17 cm2
Height: 65 in
S' Lateral: 2.6 cm
Weight: 2275.15 [oz_av]

## 2023-05-08 LAB — LIPID PANEL
Cholesterol: 209 mg/dL — ABNORMAL HIGH (ref 0–200)
HDL: 49 mg/dL (ref >40)
LDL Cholesterol: 145 mg/dL — ABNORMAL HIGH (ref 0–99)
Total CHOL/HDL Ratio: 4.3 ratio
Triglycerides: 77 mg/dL (ref <150)
VLDL: 15 mg/dL (ref 0–40)

## 2023-05-08 MED ORDER — ASPIRIN 81 MG PO TBEC: 81.0000 mg | DELAYED_RELEASE_TABLET | Freq: Every day | ORAL | 12 refills | Status: AC

## 2023-05-08 MED ORDER — LEVOTHYROXINE SODIUM 88 MCG PO TABS: 88.0000 ug | ORAL_TABLET | Freq: Every day | ORAL | 0 refills | Status: AC

## 2023-05-08 MED ORDER — CLOPIDOGREL BISULFATE 75 MG PO TABS: 75.0000 mg | ORAL_TABLET | Freq: Every day | ORAL | 0 refills | Status: AC

## 2023-05-08 MED ORDER — ATORVASTATIN CALCIUM 40 MG PO TABS
40.0000 mg | ORAL_TABLET | Freq: Every day | ORAL | Status: DC
  Administered 2023-05-08: 40 mg via ORAL
  Filled 2023-05-08: qty 1

## 2023-05-08 MED ORDER — ROSUVASTATIN CALCIUM 20 MG PO TABS: 20.0000 mg | ORAL_TABLET | Freq: Every day | ORAL | 11 refills | Status: DC

## 2023-05-08 NOTE — Progress Notes (Addendum)
STROKE TEAM PROGRESS NOTE   SUBJECTIVE (INTERVAL HISTORY) Her daughter is at the bedside.  Overall her condition is completely resolved. Pt reported episode of right leg giving out / weakness yesterday for total less than 10 min. Now at baseline. Never happened before.    OBJECTIVE Temp:  [97.9 F (36.6 C)-98.8 F (37.1 C)] 98.2 F (36.8 C) (08/04 0751) Pulse Rate:  [55-82] 56 (08/04 0751) Cardiac Rhythm: Sinus bradycardia (08/04 0701) Resp:  [12-18] 17 (08/04 0751) BP: (136-155)/(51-69) 155/61 (08/04 0751) SpO2:  [98 %-100 %] 98 % (08/04 0751) Weight:  [64.5 kg] 64.5 kg (08/03 2325)  No results for input(s): "GLUCAP" in the last 168 hours. Recent Labs  Lab 05/07/23 1319 05/07/23 1531  NA 135  --   K 4.2  --   CL 98  --   CO2 25  --   GLUCOSE 104*  --   BUN 14  --   CREATININE 0.98 0.88  CALCIUM 9.5  --    Recent Labs  Lab 05/07/23 1319  AST 25  ALT 15  ALKPHOS 45  BILITOT 1.3*  PROT 6.9  ALBUMIN 3.9   Recent Labs  Lab 05/07/23 1319 05/07/23 1531  WBC 4.4 3.6*  NEUTROABS 2.6  --   HGB 12.1 11.0*  HCT 37.6 35.3*  MCV 88.3 90.3  PLT 188 161   No results for input(s): "CKTOTAL", "CKMB", "CKMBINDEX", "TROPONINI" in the last 168 hours. Recent Labs    05/07/23 1319  LABPROT 13.7  INR 1.0   No results for input(s): "COLORURINE", "LABSPEC", "PHURINE", "GLUCOSEU", "HGBUR", "BILIRUBINUR", "KETONESUR", "PROTEINUR", "UROBILINOGEN", "NITRITE", "LEUKOCYTESUR" in the last 72 hours.  Invalid input(s): "APPERANCEUR"     Component Value Date/Time   CHOL 209 (H) 05/08/2023 0235   TRIG 77 05/08/2023 0235   HDL 49 05/08/2023 0235   CHOLHDL 4.3 05/08/2023 0235   VLDL 15 05/08/2023 0235   LDLCALC 145 (H) 05/08/2023 0235   Lab Results  Component Value Date   HGBA1C 5.9 (H) 05/07/2023   No results found for: "LABOPIA", "COCAINSCRNUR", "LABBENZ", "AMPHETMU", "THCU", "LABBARB"  No results for input(s): "ETH" in the last 168 hours.  I have personally reviewed the  radiological images below and agree with the radiology interpretations.  MR BRAIN WO CONTRAST  Result Date: 05/07/2023 CLINICAL DATA:  Headache EXAM: MRI HEAD WITHOUT CONTRAST TECHNIQUE: Multiplanar, multiecho pulse sequences of the brain and surrounding structures were obtained without intravenous contrast. COMPARISON:  MR Head 11/10/21 FINDINGS: Brain: No acute infarction, hemorrhage, hydrocephalus, extra-axial collection or mass lesion. Scattered subcortical and periventricular T2/FLAIR hyperintense lesions appear unchanged from prior exam, and likely represent sequela of chronic microvascular ischemic change. Vascular: Normal flow voids. Skull and upper cervical spine: Normal marrow signal. Sinuses/Orbits: Trace left mastoid effusion. No middle ear effusion. Paranasal sinuses are clear. Bilateral lens replacement. Orbits are otherwise unremarkable. Other: None. IMPRESSION: No acute intracranial abnormality. Electronically Signed   By: Lorenza Cambridge M.D.   On: 05/07/2023 15:32   CT ANGIO HEAD NECK W WO CM  Result Date: 05/07/2023 CLINICAL DATA:  Stroke/TIA, determine embolic source EXAM: CT ANGIOGRAPHY HEAD AND NECK WITH AND WITHOUT CONTRAST TECHNIQUE: Multidetector CT imaging of the head and neck was performed using the standard protocol during bolus administration of intravenous contrast. Multiplanar CT image reconstructions and MIPs were obtained to evaluate the vascular anatomy. Carotid stenosis measurements (when applicable) are obtained utilizing NASCET criteria, using the distal internal carotid diameter as the denominator. RADIATION DOSE REDUCTION: This exam was performed according  to the departmental dose-optimization program which includes automated exposure control, adjustment of the mA and/or kV according to patient size and/or use of iterative reconstruction technique. CONTRAST:  75mL OMNIPAQUE IOHEXOL 350 MG/ML SOLN COMPARISON:  MR Head 11/10/21 FINDINGS: CT HEAD FINDINGS Brain: No evidence of  acute infarction, hemorrhage, hydrocephalus, extra-axial collection or mass lesion/mass effect. Vascular: No hyperdense vessel or unexpected calcification. Skull: Normal. Negative for fracture or focal lesion. Sinuses/Orbits: No acute finding.  Bilateral lens replacement Other: None. Review of the MIP images confirms the above findings CTA NECK FINDINGS Aortic arch: Standard branching. Imaged portion shows no evidence of aneurysm or dissection. No significant stenosis of the major arch vessel origins. Right carotid system: No evidence of dissection, stenosis (50% or greater), or occlusion. Left carotid system: No evidence of dissection, stenosis (50% or greater), or occlusion. Vertebral arteries: Codominant. No evidence of dissection, stenosis (50% or greater), or occlusion. Skeleton: Negative. 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 Recommend correlation with biochemical markers. Upper chest: Negative. Review of the MIP images confirms the above findings CTA HEAD FINDINGS Anterior circulation: No significant stenosis, proximal occlusion, aneurysm, or vascular malformation. Posterior circulation: No significant stenosis, proximal occlusion, aneurysm, or vascular malformation. Venous sinuses: As permitted by contrast timing, patent. Anatomic variants: None Review of the MIP images confirms the above findings IMPRESSION: 1. No acute intracranial abnormality. 2. No intracranial large vessel occlusion or significant stenosis. 3. No hemodynamically significant stenosis in the neck. 4. 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 Recommend correlation with biochemical markers. Electronically Signed   By: Lorenza Cambridge M.D.   On: 05/07/2023 15:09     PHYSICAL EXAM  Temp:  [97.9 F (36.6 C)-98.8 F (37.1 C)] 98.2  F (36.8 C) (08/04 0751) Pulse Rate:  [55-82] 56 (08/04 0751) Resp:  [12-18] 17 (08/04 0751) BP: (136-155)/(51-69) 155/61 (08/04 0751) SpO2:  [98 %-100 %] 98 % (08/04 0751) Weight:  [64.5 kg] 64.5 kg (08/03 2325)  General - Well nourished, well developed, in no apparent distress.  Ophthalmologic - fundi not visualized due to noncooperation.  Cardiovascular - Regular rate and rhythm.  Mental Status -  Level of arousal and orientation to time, place, and person were intact. Language including expression, naming, repetition, comprehension was assessed and found intact. Attention span and concentration were normal. Recent and remote memory were intact. Fund of Knowledge was assessed and was intact.  Cranial Nerves II - XII - II - Visual field intact OU. III, IV, VI - Extraocular movements intact. V - Facial sensation intact bilaterally. VII - Facial movement intact bilaterally. VIII - Hearing & vestibular intact bilaterally. X - Palate elevates symmetrically. XI - Chin turning & shoulder shrug intact bilaterally. XII - Tongue protrusion intact.  Motor Strength - The patient's strength was normal in all extremities and pronator drift was absent.  Bulk was normal and fasciculations were absent.   Motor Tone - Muscle tone was assessed at the neck and appendages and was normal.  Reflexes - The patient's reflexes were symmetrical in all extremities and she had no pathological reflexes.  Sensory - Light touch, temperature/pinprick were assessed and were symmetrical.    Coordination - The patient had normal movements in the hands and feet with no ataxia or dysmetria.  Tremor was absent.  Gait and Station - deferred.   Assessment:  Lydia Roberts is a 77 y.o. female with PMH significant for breat cancer (R), HLD, osteoporosis who presented to ED c/o episode of left leg weakness while doing yard work. She did not receive IV t-PA due to symptoms resolving. Her imaging was also negative  for any acute vascular changes. Since patient has high LDL and low triglycerides, we are recommending switch from zetia to statin. Pt had concerns about myalgias so we recommend crestor (per pharmacy) with lower risk of myalgias compared to lipitor.   TIA CT no acute finding CTA head & neck no acute abnormalities.  No large vessel occlusion or stenosis. MRI w/o contrast  no acute abnormalities 2D Echo pending LDL 145 HgbA1c 5.9  Enoxaparin 40 once daily for VTE prophylaxis No antithrombotic prior to admission, now on DAPT for 21 days, then ASA 81 indefinitely Disposition:  home  BP management No home BP meds stable Long-term BP goal normotensive Recommend home BP monitoring  Hyperlipidemia Home meds:  zetia 10mg  once daily, discontinued in hospital LDL 145, goal < 70 Stop zetia. Start crestor 20mg  once daily Continue statin at discharge  Other Stroke Risk Factors Advanced age   Hospital day # 0     ATTENDING NOTE: I reviewed above note and agree with the assessment and plan. Pt was seen and examined.   Daughter is at the bedside. Pt sitting in bed, at baseline. AAO x3, provided whole information of the episode. More consistent with TIA. Stroke work up so far negative, except elevated LDL. On zetia PTA, now switch to crestor 20. Put on DAPT for 3 weeks and then ASA alone. Medically ready to d/c from neuro standpoint.   For detailed assessment and plan, please refer to above/below as I have made changes wherever appropriate.   Neurology will sign off. Please call with questions. Pt will follow up with stroke clinic NP at [facility] in about 4 weeks. Thanks for the consult.   Marvel Plan, MD PhD Stroke Neurology 05/08/2023 2:16 PM     To contact Stroke Continuity provider, please refer to [email or web address]. After hours, contact General Neurology

## 2023-05-08 NOTE — Progress Notes (Signed)
SLP Cancellation Note  Patient Details Name: Lydia Roberts MRN: [id number] DOB: 08-10-1946   Cancelled treatment:       Reason Eval/Treat Not Completed: SLP screened, no needs identified, will sign off. MRI negative, no complaint of cognitive linguistic change. Will defer eval.    , Riley Nearing 05/08/2023, 7:53 AM

## 2023-05-08 NOTE — Plan of Care (Signed)
  Problem: Education: Goal: Knowledge of disease or condition will improve Outcome: Progressing Goal: Knowledge of secondary prevention will improve (MUST DOCUMENT ALL) Outcome: Progressing Goal: Knowledge of patient specific risk factors will improve Loraine Leriche N/A or DELETE if not current risk factor) Outcome: Progressing   Problem: Ischemic Stroke/TIA Tissue Perfusion: Goal: Complications of ischemic stroke/TIA will be minimized 05/08/2023 0206 by Pearson Forster, RN Outcome: Progressing 05/08/2023 0205 by Pearson Forster, RN Outcome: Progressing   Problem: Coping: Goal: Will verbalize positive feelings about self 05/08/2023 0206 by Pearson Forster, RN Outcome: Progressing 05/08/2023 0205 by Pearson Forster, RN Outcome: Progressing Goal: Will identify appropriate support needs 05/08/2023 0206 by Pearson Forster, RN Outcome: Progressing 05/08/2023 0205 by Pearson Forster, RN Outcome: Progressing   Problem: Health Behavior/Discharge Planning: Goal: Ability to manage health-related needs will improve Outcome: Progressing Goal: Goals will be collaboratively established with patient/family Outcome: Progressing   Problem: Self-Care: Goal: Ability to participate in self-care as condition permits will improve Outcome: Progressing Goal: Verbalization of feelings and concerns over difficulty with self-care will improve Outcome: Progressing Goal: Ability to communicate needs accurately will improve Outcome: Progressing   Problem: Nutrition: Goal: Risk of aspiration will decrease 05/08/2023 0206 by Pearson Forster, RN Outcome: Progressing 05/08/2023 0205 by Pearson Forster, RN Outcome: Progressing Goal: Dietary intake will improve 05/08/2023 0206 by Pearson Forster, RN Outcome: Progressing 05/08/2023 0205 by Pearson Forster, RN Outcome: Progressing   Problem: Education: Goal: Knowledge of General Education information will improve Description:  Including pain rating scale, medication(s)/side effects and non-pharmacologic comfort measures Outcome: Progressing   Problem: Health Behavior/Discharge Planning: Goal: Ability to manage health-related needs will improve Outcome: Progressing   Problem: Clinical Measurements: Goal: Ability to maintain clinical measurements within normal limits will improve Outcome: Progressing Goal: Will remain free from infection Outcome: Progressing Goal: Diagnostic test results will improve Outcome: Progressing Goal: Respiratory complications will improve Outcome: Progressing Goal: Cardiovascular complication will be avoided Outcome: Progressing   Problem: Activity: Goal: Risk for activity intolerance will decrease Outcome: Progressing   Problem: Nutrition: Goal: Adequate nutrition will be maintained Outcome: Progressing   Problem: Coping: Goal: Level of anxiety will decrease Outcome: Progressing   Problem: Elimination: Goal: Will not experience complications related to bowel motility Outcome: Progressing Goal: Will not experience complications related to urinary retention Outcome: Progressing   Problem: Pain Managment: Goal: General experience of comfort will improve Outcome: Progressing   Problem: Safety: Goal: Ability to remain free from injury will improve Outcome: Progressing   Problem: Skin Integrity: Goal: Risk for impaired skin integrity will decrease Outcome: Progressing

## 2023-05-08 NOTE — Progress Notes (Signed)
Discharge instruction reviewed with patient and daughter. Discharged to home using a wheelchair.

## 2023-05-08 NOTE — Evaluation (Signed)
Physical Therapy Evaluation Patient Details Name: Lydia Roberts MRN: [id number] DOB: 1946/04/10 Today's Date: 05/08/2023  History of Present Illness  Pt is a 77 y.o. female presenting with LLE weakness while weed eating; symptoms resolved within ~5 minutes. MRI brain negative for acute abnormality. PMH significant for R sided breast cancer, osteoporosis, thyroid disease.  Clinical Impression  Pt is presenting at baseline level of functioning currently. Pt is independent without an AD. Very mild coordination deficits that are not effecting functional mobility or ADL's. Due to pt current functional status, PLOF, home set up currently no recommended skilled physical therapy services at this time. Pt will be discharged from acute care physical therapy; please re-consult if future needs arise. Pt tolerated treatment session well with no signs/symptoms of cardiac or respiratory distress.        Equipment Recommendations None recommended by PT  Recommendations for Other Services       Functional Status Assessment Patient has not had a recent decline in their functional status     Precautions / Restrictions Precautions Precautions: None Restrictions Weight Bearing Restrictions: No      Mobility  Bed Mobility Overal bed mobility: Independent               Patient Response: Cooperative  Transfers Overall transfer level: Independent    Ambulation/Gait Ambulation/Gait assistance: Independent Gait Distance (Feet): 300 Feet Assistive device: None Gait Pattern/deviations: WFL(Within Functional Limits) Gait velocity: Slightly decreased most likely related to wearting hospital socks not shoes Gait velocity interpretation: >2.62 ft/sec, indicative of community ambulatory      Stairs Stairs: Yes Stairs assistance: Independent Stair Management: One rail Right, One rail Left, Forwards, Alternating pattern Number of Stairs: 4 General stair comments: Pt can navigate stairs per home  set up  Wheelchair Mobility     Tilt Bed Tilt Bed Patient Response: Cooperative  Modified Rankin (Stroke Patients Only) Modified Rankin (Stroke Patients Only) Pre-Morbid Rankin Score: No symptoms Modified Rankin: No significant disability     Balance Overall balance assessment: Independent         Pertinent Vitals/Pain Pain Assessment Pain Assessment: No/denies pain    Home Living Family/patient expects to be discharged to:: Private residence Living Arrangements: Alone;Other (Comment) (has a cat, kids live close by) Available Help at Discharge: Family;Available PRN/intermittently Type of Home: House Home Access: Stairs to enter Entrance Stairs-Rails: Left;Right;Can reach both Entrance Stairs-Number of Steps: 2   Home Layout: One level Home Equipment: Adaptive equipment;Rollator (4 wheels);Cane - quad;BSC/3in1;Transport chair      Prior Function Prior Level of Function : Independent/Modified Independent;Driving             Mobility Comments: Pt denies falls, mobilizing without an AD ADLs Comments: independent     Hand Dominance   Dominant Hand: Right    Extremity/Trunk Assessment   Upper Extremity Assessment Upper Extremity Assessment: Overall WFL for tasks assessed    Lower Extremity Assessment Lower Extremity Assessment: Overall WFL for tasks assessed    Cervical / Trunk Assessment Cervical / Trunk Assessment: Normal  Communication   Communication: No difficulties  Cognition Arousal/Alertness: Awake/alert Behavior During Therapy: WFL for tasks assessed/performed Overall Cognitive Status: Within Functional Limits for tasks assessed        General Comments General comments (skin integrity, edema, etc.): Pt has very minimal coordaination difficulties on the L hand with RAMPs and some difficutly with toe tapping in standing. Not significant        Assessment/Plan    PT Assessment Patient does  not need any further PT services         PT  Goals (Current goals can be found in the Care Plan section)  Acute Rehab PT Goals PT Goal Formulation: All assessment and education complete, DC therapy     AM-PAC PT "6 Clicks" Mobility  Outcome Measure Help needed turning from your back to your side while in a flat bed without using bedrails?: None Help needed moving from lying on your back to sitting on the side of a flat bed without using bedrails?: None Help needed moving to and from a bed to a chair (including a wheelchair)?: None Help needed standing up from a chair using your arms (e.g., wheelchair or bedside chair)?: None Help needed to walk in hospital room?: None Help needed climbing 3-5 steps with a railing? : None 6 Click Score: 24    End of Session   Activity Tolerance: Patient tolerated treatment well Patient left: in bed;with family/visitor present;with call bell/phone within reach Nurse Communication: Mobility status      Time: 0920-0937 PT Time Calculation (min) (ACUTE ONLY): 17 min   Charges:   PT Evaluation $PT Eval Low Complexity: 1 Low   PT General Charges $$ ACUTE PT VISIT: 1 Visit        Harrel Carina, DPT, CLT  Acute Rehabilitation Services Office: [phone] (Secure chat preferred)   Claudia Desanctis 05/08/2023, 9:41 AM

## 2023-05-08 NOTE — Plan of Care (Signed)
  Problem: Ischemic Stroke/TIA Tissue Perfusion: Goal: Complications of ischemic stroke/TIA will be minimized Outcome: Progressing   Problem: Coping: Goal: Will verbalize positive feelings about self Outcome: Progressing Goal: Will identify appropriate support needs Outcome: Progressing   Problem: Nutrition: Goal: Risk of aspiration will decrease Outcome: Progressing Goal: Dietary intake will improve Outcome: Progressing

## 2023-05-08 NOTE — Progress Notes (Signed)
OT Cancellation Note  Patient Details Name: Lydia Roberts MRN: [id number] DOB: 08/26/1946   Cancelled Treatment:    Reason Eval/Treat Not Completed: OT screened, no needs identified, will sign off. Pt reports pt back to baseline. OT to sign off. Please re-consult if change in status.   [name], OTR/L [facility] Acute Rehabilitation Office: [phone]   [name] 05/08/2023, 12:25 PM

## 2023-05-08 NOTE — Discharge Summary (Addendum)
Physician Discharge Summary  ANNASTYN SILVEY WGN:[id number] DOB: Jun 21, 1946 DOA: 05/07/2023  PCP: Irena Reichmann, DO  Admit date: 05/07/2023 Discharge date: 05/08/2023    Admitted From: Home Disposition: Home  Recommendations for Outpatient Follow-up:  Follow up with PCP in 1-2 weeks Please obtain BMP/CBC in one week Follow-up with neurology in 1 month Please follow up with your PCP on the following pending results: Unresulted Labs (From admission, onward)     Start     Ordered   05/14/23 0500  Creatinine, serum  (enoxaparin (LOVENOX)    CrCl >/= 30 ml/min)  Weekly,   R     Comments: while on enoxaparin therapy    05/07/23 1436   05/08/23 0632  PTH, intact and calcium  Once,   R       Question:  Specimen collection method  Answer:  Lab=Lab collect   05/08/23 0631              Home Health: None Equipment/Devices: None  Discharge Condition: Stable CODE STATUS: Full code Diet recommendation: Cardiac  Subjective: Seen and examined.  No complaints.  No recurrence of symptoms.  Daughter at the bedside.  Patient excited about going home.  Brief/Interim Summary: SAMAURI Roberts is a 77 y.o. female with medical history significant of right-sided breast cancer, osteoporosis presented to ED with an episode of left leg weakness.  According to patient, she was doing some yard work outside the house and suddenly she noticed that her leg gave up.  This lasted only for 5 minutes.  She was brought into the ED, hemodynamically stable.  Symptoms did not recur.  Admitted for further workup of possible TIA per neurology recommendations.  Had CTA head and neck as well as MRI brain which were negative for stroke.  TIA ruled in.  She was seen by neurology and started on DAPT aspirin and Plavix and recommendation was to continue both for 21 days and then discontinue Plavix but continue aspirin afterwards.  She was on Zetia, her LDL was 145.  Discharging on Crestor 20 mg per neurology recommendation.  Also  her hemoglobin A1c was 5.9.  She was seen by PT OT as well as neurology and cleared for discharge.  Her echo is pending, she will be discharged once echo is completed.  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  Patient had no symptoms and PTH was ordered which is pending at the time of discharge.  Follow-up with PCP.  Discharge plan was discussed with patient and/or family member and they verbalized understanding and agreed with it.  Discharge Diagnoses:  Principal Problem:   TIA (transient ischemic attack) Active Problems:   Breast cancer of upper-inner quadrant of right female breast (HCC)   Prediabetes    Discharge Instructions   Allergies as of 05/08/2023       Reactions   Elemental Sulfur Other (See Comments)   Headache   Codeine Palpitations   Hallucinations        Medication List     STOP taking these medications    ezetimibe 10 MG tablet Commonly known as: ZETIA       TAKE these medications    anastrozole 1 MG tablet Commonly known as: ARIMIDEX TAKE 1 TABLET BY MOUTH DAILY   aspirin EC 81 MG tablet Take 1 tablet (81 mg total) by mouth daily. Swallow whole. Start taking on: May 09, 2023   cholecalciferol 1000 units tablet Commonly known as: VITAMIN D Take 1,000 Units by mouth daily.   clopidogrel 75 MG tablet Commonly known as: PLAVIX Take 1 tablet (75 mg total) by mouth daily for 20 days. Start taking on: May 09, 2023   ibandronate 150 MG tablet Commonly known as: BONIVA TAKE 1 TAB BY MOUTH MONTHLY ON  AN EMPTY STOMACH WITH FULL GLASS WATER 1/2 HOUR BEFORE FIRST FOOD DRINK OR MEDS. STAY UPRIGHT FOR  1/2 HOUR What changed: See the new instructions.   levothyroxine 88 MCG tablet Commonly known as: SYNTHROID Take 88 mcg by mouth daily before breakfast. What changed: Another medication with the same name was changed.  Make sure you understand how and when to take each.   levothyroxine 88 MCG tablet Commonly known as: SYNTHROID Take 1 tablet (88 mcg total) by mouth daily at 6 (six) AM. Start taking on: May 09, 2023 What changed:  medication strength how much to take when to take this   Restasis 0.05 % ophthalmic emulsion Generic drug: cycloSPORINE Place 1 drop into both eyes daily.   rosuvastatin 20 MG tablet Commonly known as: Crestor Take 1 tablet (20 mg total) by mouth daily.        Follow-up Information     Irena Reichmann, DO Follow up in 1 week(s).   Specialty: Family Medicine Contact information: [address] [place] [address] [place] [phone]                Allergies  Allergen Reactions   Elemental Sulfur Other (See Comments)    Headache    Codeine Palpitations    Hallucinations     Consultations: Neurology   Procedures/Studies: MR BRAIN WO CONTRAST  Result Date: 05/07/2023 CLINICAL DATA:  Headache EXAM: MRI HEAD WITHOUT CONTRAST TECHNIQUE: Multiplanar, multiecho pulse sequences of the brain and surrounding structures were obtained without intravenous contrast. COMPARISON:  MR Head 11/10/21 FINDINGS: Brain: No acute infarction, hemorrhage, hydrocephalus, extra-axial collection or mass lesion. Scattered subcortical and periventricular T2/FLAIR hyperintense lesions appear unchanged from prior exam, and likely represent sequela of chronic microvascular ischemic change. Vascular: Normal flow voids. Skull and upper cervical spine: Normal marrow signal. Sinuses/Orbits: Trace left mastoid effusion. No middle ear effusion. Paranasal sinuses are clear. Bilateral lens replacement. Orbits are otherwise unremarkable. Other: None. IMPRESSION: No acute intracranial abnormality. Electronically Signed   By: Lorenza Cambridge M.D.   On: 05/07/2023 15:32   CT ANGIO HEAD NECK W WO CM  Result Date: 05/07/2023 CLINICAL DATA:  Stroke/TIA, determine embolic source EXAM: CT  ANGIOGRAPHY HEAD AND NECK WITH AND WITHOUT CONTRAST TECHNIQUE: Multidetector CT imaging of the head and neck was performed using the standard protocol during bolus administration of intravenous contrast. Multiplanar CT image reconstructions and MIPs were obtained to evaluate the vascular anatomy. Carotid stenosis measurements (when applicable) are obtained utilizing NASCET criteria, using the distal internal carotid diameter as the denominator. RADIATION DOSE REDUCTION: This exam was performed according to the departmental dose-optimization program which includes automated exposure control, adjustment of the mA and/or kV according to patient size and/or use of iterative reconstruction technique. CONTRAST:  75mL OMNIPAQUE IOHEXOL 350 MG/ML SOLN COMPARISON:  MR Head 11/10/21 FINDINGS: CT HEAD FINDINGS Brain: No evidence of acute infarction, hemorrhage, hydrocephalus, extra-axial collection or mass lesion/mass effect. Vascular: No hyperdense vessel or unexpected calcification. Skull: Normal. Negative for fracture or focal lesion. Sinuses/Orbits: No acute finding.  Bilateral lens replacement Other: None. Review of the MIP images confirms the above  findings CTA NECK FINDINGS Aortic arch: Standard branching. Imaged portion shows no evidence of aneurysm or dissection. No significant stenosis of the major arch vessel origins. Right carotid system: No evidence of dissection, stenosis (50% or greater), or occlusion. Left carotid system: No evidence of dissection, stenosis (50% or greater), or occlusion. Vertebral arteries: Codominant. No evidence of dissection, stenosis (50% or greater), or occlusion. Skeleton: Negative. 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 Recommend correlation with biochemical markers. Upper chest: Negative. Review of the MIP images confirms the above findings CTA HEAD FINDINGS Anterior  circulation: No significant stenosis, proximal occlusion, aneurysm, or vascular malformation. Posterior circulation: No significant stenosis, proximal occlusion, aneurysm, or vascular malformation. Venous sinuses: As permitted by contrast timing, patent. Anatomic variants: None Review of the MIP images confirms the above findings IMPRESSION: 1. No acute intracranial abnormality. 2. No intracranial large vessel occlusion or significant stenosis. 3. No hemodynamically significant stenosis in the neck. 4. 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 Recommend correlation with biochemical markers. Electronically Signed   By: Lorenza Cambridge M.D.   On: 05/07/2023 15:09     Discharge Exam: Vitals:   05/08/23 0321 05/08/23 0751  BP: (!) 144/66 (!) 155/61  Pulse: (!) 56 (!) 56  Resp: 18 17  Temp: 98.3 F (36.8 C) 98.2 F (36.8 C)  SpO2: 99% 98%   Vitals:   05/07/23 2021 05/07/23 2325 05/08/23 0321 05/08/23 0751  BP: (!) 139/55 136/68 (!) 144/66 (!) 155/61  Pulse: 82 63 (!) 56 (!) 56  Resp: 16 18 18 17   Temp: 98.8 F (37.1 C) 97.9 F (36.6 C) 98.3 F (36.8 C) 98.2 F (36.8 C)  TempSrc: Oral Oral Oral Oral  SpO2: 100% 99% 99% 98%  Weight:  64.5 kg    Height:        General: Pt is alert, awake, not in acute distress Cardiovascular: RRR, S1/S2 +, no rubs, no gallops Respiratory: CTA bilaterally, no wheezing, no rhonchi Abdominal: Soft, NT, ND, bowel sounds + Extremities: no edema, no cyanosis    The results of significant diagnostics from this hospitalization (including imaging, microbiology, ancillary and laboratory) are listed below for reference.     Microbiology: No results found for this or any previous visit (from the past 240 hour(s)).   Labs: BNP (last 3 results) No results for input(s): "BNP" in the last 8760 hours. Basic Metabolic Panel: Recent Labs  Lab 05/07/23 1319 05/07/23 1531   NA 135  --   K 4.2  --   CL 98  --   CO2 25  --   GLUCOSE 104*  --   BUN 14  --   CREATININE 0.98 0.88  CALCIUM 9.5  --    Liver Function Tests: Recent Labs  Lab 05/07/23 1319  AST 25  ALT 15  ALKPHOS 45  BILITOT 1.3*  PROT 6.9  ALBUMIN 3.9   No results for input(s): "LIPASE", "AMYLASE" in the last 168 hours. No results for input(s): "AMMONIA" in the last 168 hours. CBC: Recent Labs  Lab 05/07/23 1319 05/07/23 1531  WBC 4.4 3.6*  NEUTROABS 2.6  --   HGB 12.1 11.0*  HCT 37.6 35.3*  MCV 88.3 90.3  PLT 188 161   Cardiac Enzymes: No results for input(s): "CKTOTAL", "CKMB", "CKMBINDEX", "TROPONINI" in the last 168 hours. BNP: Invalid input(s): "  POCBNP" CBG: No results for input(s): "GLUCAP" in the last 168 hours. D-Dimer No results for input(s): "DDIMER" in the last 72 hours. Hgb A1c Recent Labs    05/07/23 1319  HGBA1C 5.9*   Lipid Profile Recent Labs    05/08/23 0235  CHOL 209*  HDL 49  LDLCALC 145*  TRIG 77  CHOLHDL 4.3   Thyroid function studies No results for input(s): "TSH", "T4TOTAL", "T3FREE", "THYROIDAB" in the last 72 hours.  Invalid input(s): "FREET3" Anemia work up No results for input(s): "VITAMINB12", "FOLATE", "FERRITIN", "TIBC", "IRON", "RETICCTPCT" in the last 72 hours. Urinalysis    Component Value Date/Time   COLORURINE COLORLESS (A) 11/10/2021 1531   APPEARANCEUR CLEAR 11/10/2021 1531   LABSPEC 1.002 (L) 11/10/2021 1531   PHURINE 7.0 11/10/2021 1531   GLUCOSEU NEGATIVE 11/10/2021 1531   HGBUR SMALL (A) 11/10/2021 1531   BILIRUBINUR NEGATIVE 11/10/2021 1531   BILIRUBINUR negative 06/28/2020 1216   BILIRUBINUR neg 10/08/2012 1324   KETONESUR NEGATIVE 11/10/2021 1531   PROTEINUR NEGATIVE 11/10/2021 1531   UROBILINOGEN 0.2 06/28/2020 1216   UROBILINOGEN 0.2 01/23/2015 1047   NITRITE NEGATIVE 11/10/2021 1531   LEUKOCYTESUR NEGATIVE 11/10/2021 1531   Sepsis Labs Recent Labs  Lab 05/07/23 1319 05/07/23 1531  WBC 4.4  3.6*   Microbiology No results found for this or any previous visit (from the past 240 hour(s)).  FURTHER DISCHARGE INSTRUCTIONS:   Get Medicines reviewed and adjusted: Please take all your medications with you for your next visit with your Primary MD   Laboratory/radiological data: Please request your Primary MD to go over all hospital tests and procedure/radiological results at the follow up, please ask your Primary MD to get all Hospital records sent to his/her office.   In some cases, they will be blood work, cultures and biopsy results pending at the time of your discharge. Please request that your primary care M.D. goes through all the records of your hospital data and follows up on these results.   Also Note the following: If you experience worsening of your admission symptoms, develop shortness of breath, life threatening emergency, suicidal or homicidal thoughts you must seek medical attention immediately by calling 911 or calling your MD immediately  if symptoms less severe.   You must read complete instructions/literature along with all the possible adverse reactions/side effects for all the Medicines you take and that have been prescribed to you. Take any new Medicines after you have completely understood and accpet all the possible adverse reactions/side effects.    Do not drive when taking Pain medications or sleeping medications (Benzodaizepines)   Do not take more than prescribed Pain, Sleep and Anxiety Medications. It is not advisable to combine anxiety,sleep and pain medications without talking with your primary care practitioner   Special Instructions: If you have smoked or chewed Tobacco  in the last 2 yrs please stop smoking, stop any regular Alcohol  and or any Recreational drug use.   Wear Seat belts while driving.   Please note: You were cared for by a hospitalist during your hospital stay. Once you are discharged, your primary care physician will handle any  further medical issues. Please note that NO REFILLS for any discharge medications will be authorized once you are discharged, as it is imperative that you return to your primary care physician (or establish a relationship with a primary care physician if you do not have one) for your post hospital discharge needs so that they can reassess your need for medications and  monitor your lab values  Time coordinating discharge: Over 30 minutes  SIGNED:   Hughie Closs, MD  [facility] 05/08/2023, 11:17 AM *Please note that this is a verbal dictation therefore any spelling or grammatical errors are due to the "[employer]agon [employer] One" system interpretation. If 7PM-7AM, please contact night-coverage [email or web address]

## 2023-05-23 DIAGNOSIS — H43813 Vitreous degeneration, bilateral: Secondary | ICD-10-CM | POA: Diagnosis not present

## 2023-06-14 DIAGNOSIS — E039 Hypothyroidism, unspecified: Secondary | ICD-10-CM | POA: Diagnosis not present

## 2023-06-14 DIAGNOSIS — N1831 Chronic kidney disease, stage 3a: Secondary | ICD-10-CM | POA: Diagnosis not present

## 2023-06-14 DIAGNOSIS — E785 Hyperlipidemia, unspecified: Secondary | ICD-10-CM | POA: Diagnosis not present

## 2023-06-14 DIAGNOSIS — M810 Age-related osteoporosis without current pathological fracture: Secondary | ICD-10-CM | POA: Diagnosis not present

## 2023-06-21 DIAGNOSIS — Z23 Encounter for immunization: Secondary | ICD-10-CM | POA: Diagnosis not present

## 2023-06-21 DIAGNOSIS — M542 Cervicalgia: Secondary | ICD-10-CM | POA: Diagnosis not present

## 2023-06-21 DIAGNOSIS — Z09 Encounter for follow-up examination after completed treatment for conditions other than malignant neoplasm: Secondary | ICD-10-CM | POA: Diagnosis not present

## 2023-06-21 DIAGNOSIS — E039 Hypothyroidism, unspecified: Secondary | ICD-10-CM | POA: Diagnosis not present

## 2023-06-21 DIAGNOSIS — M810 Age-related osteoporosis without current pathological fracture: Secondary | ICD-10-CM | POA: Diagnosis not present

## 2023-06-21 DIAGNOSIS — N1831 Chronic kidney disease, stage 3a: Secondary | ICD-10-CM | POA: Diagnosis not present

## 2023-06-21 DIAGNOSIS — G459 Transient cerebral ischemic attack, unspecified: Secondary | ICD-10-CM | POA: Diagnosis not present

## 2023-07-06 ENCOUNTER — Telehealth: Payer: Self-pay | Admitting: Hematology and Oncology

## 2023-07-06 NOTE — Telephone Encounter (Signed)
Left patient a message regarding rescheduled appointment times/dates due to provider being out of office

## 2023-07-14 DIAGNOSIS — E785 Hyperlipidemia, unspecified: Secondary | ICD-10-CM | POA: Diagnosis not present

## 2023-07-14 DIAGNOSIS — E039 Hypothyroidism, unspecified: Secondary | ICD-10-CM | POA: Diagnosis not present

## 2023-07-14 DIAGNOSIS — E559 Vitamin D deficiency, unspecified: Secondary | ICD-10-CM | POA: Diagnosis not present

## 2023-07-14 DIAGNOSIS — G459 Transient cerebral ischemic attack, unspecified: Secondary | ICD-10-CM | POA: Diagnosis not present

## 2023-07-14 DIAGNOSIS — M542 Cervicalgia: Secondary | ICD-10-CM | POA: Diagnosis not present

## 2023-07-14 DIAGNOSIS — R7309 Other abnormal glucose: Secondary | ICD-10-CM | POA: Diagnosis not present

## 2023-07-14 DIAGNOSIS — Z23 Encounter for immunization: Secondary | ICD-10-CM | POA: Diagnosis not present

## 2023-07-14 DIAGNOSIS — Z79899 Other long term (current) drug therapy: Secondary | ICD-10-CM | POA: Diagnosis not present

## 2023-08-11 ENCOUNTER — Other Ambulatory Visit: Payer: Self-pay | Admitting: Hematology and Oncology

## 2023-08-11 DIAGNOSIS — Z1231 Encounter for screening mammogram for malignant neoplasm of breast: Secondary | ICD-10-CM | POA: Diagnosis not present

## 2023-08-16 ENCOUNTER — Encounter: Payer: Self-pay | Admitting: Hematology and Oncology

## 2023-08-17 ENCOUNTER — Encounter: Payer: Self-pay | Admitting: Hematology and Oncology

## 2023-08-30 ENCOUNTER — Inpatient Hospital Stay: Payer: Medicare Other | Attending: Hematology and Oncology | Admitting: Hematology and Oncology

## 2023-08-30 VITALS — BP 144/59 | HR 64 | Temp 97.9°F | Resp 17 | Ht 65.0 in | Wt 150.6 lb

## 2023-08-30 DIAGNOSIS — Z79811 Long term (current) use of aromatase inhibitors: Secondary | ICD-10-CM | POA: Diagnosis not present

## 2023-08-30 DIAGNOSIS — Z923 Personal history of irradiation: Secondary | ICD-10-CM | POA: Diagnosis not present

## 2023-08-30 DIAGNOSIS — F40240 Claustrophobia: Secondary | ICD-10-CM | POA: Diagnosis not present

## 2023-08-30 DIAGNOSIS — C50211 Malignant neoplasm of upper-inner quadrant of right female breast: Secondary | ICD-10-CM | POA: Diagnosis not present

## 2023-08-30 DIAGNOSIS — M810 Age-related osteoporosis without current pathological fracture: Secondary | ICD-10-CM | POA: Insufficient documentation

## 2023-08-30 DIAGNOSIS — Z9011 Acquired absence of right breast and nipple: Secondary | ICD-10-CM | POA: Insufficient documentation

## 2023-08-30 DIAGNOSIS — Z170 Estrogen receptor positive status [ER+]: Secondary | ICD-10-CM | POA: Diagnosis not present

## 2023-08-30 DIAGNOSIS — Z1721 Progesterone receptor positive status: Secondary | ICD-10-CM | POA: Insufficient documentation

## 2023-08-30 MED ORDER — ROSUVASTATIN CALCIUM 10 MG PO TABS: 10.0000 mg | ORAL_TABLET | Freq: Every day | ORAL | Status: AC

## 2023-08-30 NOTE — Progress Notes (Signed)
Patient Care Team: Irena Reichmann, DO as PCP - General (Family Medicine) Ok Edwards, MD (Inactive) as Referring Physician (Gynecology) Harriette Bouillon, MD as Consulting Physician (General Surgery) Serena Croissant, MD as Consulting Physician (Hematology and Oncology) Dorothy Puffer, MD as Consulting Physician (Radiation Oncology)  DIAGNOSIS:  Encounter Diagnosis  Name Primary?   Malignant neoplasm of upper-inner quadrant of right breast in female, estrogen receptor positive (HCC) Yes    SUMMARY OF ONCOLOGIC HISTORY: Oncology History  Breast cancer of upper-inner quadrant of right female breast (HCC)  06/12/2014 Mammogram   Right breast architectural distortion (dense breasts); Ultrasound 1.3 cm mass at 1:00 position additional masses behind this mass measuring 1.3 cm and 0.7 cm. These lumps were palpable   06/13/2014 Initial Diagnosis   Right breast: 1.3cm mass: Invasive mammary cancer probably ductal with mammary ca in situ ER 100% PR 80% gases on 30% HER-2 negative; posterior 1.3 cm mass: IDC ER 100% PR 90% gets some 20% HER-2 negative and 0.7 cm mass: DCIS   07/25/2014 Surgery   Right simple mastectomy: Invasive ductal carcinoma No LVI, Pos for PNI, 3.1 cm grade 3 with DCIS grade 3, ER 100%, PR 100%, HER-2/neu negative Ki-67 20% T2, N0, M0 stage II A., Oncotype DX recurrence over 4, ROR 4%   08/14/2014 - 09/2020 Anti-estrogen oral therapy   Tamoxifen 20 mg daily   08/24/2020 Relapse/Recurrence   Right chest wall postmastectomy skin erythema with slight palpable nodularity. 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 CT CAP and bone scan on 09/03/20 showed no evidence of metastases.   10/07/2020 Surgery   Resection of right chest mass ([facility]): poorly differentiated carcinoma, with positive inferior and deep margins.    10/2020 -  Anti-estrogen oral therapy   Switched to anastrozole after resection of  recurrence      CHIEF COMPLIANT: Follow-up on anastrozole therapy  HISTORY OF PRESENT ILLNESS:   History of Present Illness   The patient, with a history of breast cancer and osteoporosis, presents for a routine follow-up. She has been on anastrozole for over three years without any significant side effects. She denies experiencing hot flashes, a common side effect of anastrozole, and instead reports a tendency to feel cold. She has been managing her osteoporosis with ibandronate and had a bone density scan performed last year by another provider. The patient also mentions a change in her medication dosage, but it is unclear which medication she is referring to.         ALLERGIES:  is allergic to elemental sulfur and codeine.  MEDICATIONS:  Current Outpatient Medications  Medication Sig Dispense Refill   anastrozole (ARIMIDEX) 1 MG tablet TAKE 1 TABLET BY MOUTH DAILY 100 tablet 2   aspirin EC 81 MG tablet Take 1 tablet (81 mg total) by mouth daily. Swallow whole. 30 tablet 12   cholecalciferol (VITAMIN D) 1000 UNITS tablet Take 1,000 Units by mouth daily.     ibandronate (BONIVA) 150 MG tablet TAKE 1 TAB BY MOUTH MONTHLY ON  AN EMPTY STOMACH WITH FULL GLASS WATER 1/2 HOUR BEFORE FIRST FOOD DRINK OR MEDS. STAY UPRIGHT FOR  1/2 HOUR (Patient taking differently: Take 150 mg by mouth every 30 (thirty) days.) 3 tablet 3   levothyroxine (SYNTHROID) 88 MCG tablet Take 1 tablet (88 mcg total) by mouth daily at 6 (six) AM. 30 tablet 0   RESTASIS 0.05 % ophthalmic emulsion Place 1 drop into both eyes daily.  rosuvastatin (CRESTOR) 10 MG tablet Take 1 tablet (10 mg total) by mouth daily.     No current facility-administered medications for this visit.    PHYSICAL EXAMINATION: ECOG PERFORMANCE STATUS: 1 - Symptomatic but completely ambulatory  Vitals:   08/30/23 0927  BP: (!) 144/59  Pulse: 64  Resp: 17  Temp: 97.9 F (36.6 C)  SpO2: 100%   Filed Weights   08/30/23 0927  Weight:  150 lb 9.6 oz (68.3 kg)    Physical Exam   BREAST: No palpable masses or concerns identified in the left breast.      (exam performed in the presence of a chaperone)  LABORATORY DATA:  I have reviewed the data as listed    Latest Ref Rng & Units 05/08/2023    6:39 AM 05/07/2023    3:31 PM 05/07/2023    1:19 PM  CMP  Glucose 70 - 99 mg/dL   469   BUN 8 - 23 mg/dL   14   Creatinine 6.29 - 1.00 mg/dL  5.28  4.13   Sodium 244 - 145 mmol/L   135   Potassium 3.5 - 5.1 mmol/L   4.2   Chloride 98 - 111 mmol/L   98   CO2 22 - 32 mmol/L   25   Calcium 8.7 - 10.3 mg/dL 8.9   9.5   Total Protein 6.5 - 8.1 g/dL   6.9   Total Bilirubin 0.3 - 1.2 mg/dL   1.3   Alkaline Phos 38 - 126 U/L   45   AST 15 - 41 U/L   25   ALT 0 - 44 U/L   15     Lab Results  Component Value Date   WBC 3.6 (L) 05/07/2023   HGB 11.0 (L) 05/07/2023   HCT 35.3 (L) 05/07/2023   MCV 90.3 05/07/2023   PLT 161 05/07/2023   NEUTROABS 2.6 05/07/2023    ASSESSMENT & PLAN:  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 N0 M0 stage IA: Pathologic stage TII, N0, M0 stage II A., 3.1 cm tumor ER/PR positive HER-2 negative, grade 3, perineural invasion present but no lymphovascular invasion. 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  Adj XRT completed 01/13/21   Current Treatment: Anastrozole  Anastrozole Toxicities: Denies any hot flashes or arthralgias or  myalgias.   Osteoporosis: On Boniva and calcium and vitamin D Claustrophobia: Patient has severe anxiety and panic disorder when she is in close spaces.    Breast cancer surveillance: Breast exam done 08/30/2023: Benign Mammogram left breast at [facility] 08/11/2023: Benign breast density category C   RTC in in 1 year ------------------------------------- Assessment and Plan    Breast Cancer Patient is 9 years post original diagnosis and 3 years on Anastrozole with no reported side effects. Recent mammogram of the left breast was normal. No palpable masses or concerns on physical examination. -Continue Anastrozole as prescribed. -Continue regular mammograms.  Bone Health Patient had a bone density scan last year, results not  available at this time. -Obtain bone density scan results from Dr. Thomasena Edis.  Medication Management Patient reports receiving excess medication from mail order pharmacy. -Advise patient to manage medication refills appropriately to avoid excess supply.          No orders of the defined types were placed in this encounter.  The patient has a good understanding of the overall plan. she agrees with it. she will call with any problems that may develop before the next visit here. Total time spent: 30 mins including face to face time and time spent for planning, charting and co-ordination of care   Tamsen Meek, MD 08/30/23

## 2023-08-30 NOTE — Assessment & Plan Note (Signed)
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 N0 M0 stage IA: Pathologic stage TII, N0, M0 stage II A., 3.1 cm tumor ER/PR positive HER-2 negative, grade 3, perineural invasion present but no lymphovascular invasion. 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  Adj XRT completed 01/13/21   Current Treatment: Anastrozole  Anastrozole Toxicities: Denies any hot flashes or arthralgias or myalgias.   Osteoporosis: On Boniva and calcium and vitamin D Claustrophobia: Patient has severe anxiety and panic disorder when she is in close spaces.    Breast cancer surveillance: Breast exam done 08/30/2023: Benign Mammogram left breast at [facility] 08/11/2023: Benign breast density category C   RTC in in 1 year

## 2023-09-02 ENCOUNTER — Ambulatory Visit: Payer: Medicare Other | Admitting: Hematology and Oncology

## 2023-12-21 DIAGNOSIS — E039 Hypothyroidism, unspecified: Secondary | ICD-10-CM | POA: Diagnosis not present

## 2023-12-21 DIAGNOSIS — E559 Vitamin D deficiency, unspecified: Secondary | ICD-10-CM | POA: Diagnosis not present

## 2023-12-21 DIAGNOSIS — Z79899 Other long term (current) drug therapy: Secondary | ICD-10-CM | POA: Diagnosis not present

## 2023-12-21 DIAGNOSIS — E785 Hyperlipidemia, unspecified: Secondary | ICD-10-CM | POA: Diagnosis not present

## 2023-12-21 DIAGNOSIS — R7309 Other abnormal glucose: Secondary | ICD-10-CM | POA: Diagnosis not present

## 2023-12-28 DIAGNOSIS — E7800 Pure hypercholesterolemia, unspecified: Secondary | ICD-10-CM | POA: Diagnosis not present

## 2023-12-28 DIAGNOSIS — Z0000 Encounter for general adult medical examination without abnormal findings: Secondary | ICD-10-CM | POA: Diagnosis not present

## 2023-12-28 DIAGNOSIS — Z79899 Other long term (current) drug therapy: Secondary | ICD-10-CM | POA: Diagnosis not present

## 2023-12-28 DIAGNOSIS — C50919 Malignant neoplasm of unspecified site of unspecified female breast: Secondary | ICD-10-CM | POA: Diagnosis not present

## 2023-12-28 DIAGNOSIS — N1831 Chronic kidney disease, stage 3a: Secondary | ICD-10-CM | POA: Diagnosis not present

## 2023-12-28 DIAGNOSIS — E559 Vitamin D deficiency, unspecified: Secondary | ICD-10-CM | POA: Diagnosis not present

## 2023-12-28 DIAGNOSIS — Z8673 Personal history of transient ischemic attack (TIA), and cerebral infarction without residual deficits: Secondary | ICD-10-CM | POA: Diagnosis not present

## 2024-02-14 ENCOUNTER — Other Ambulatory Visit: Payer: Self-pay | Admitting: *Deleted

## 2024-02-14 MED ORDER — ANASTROZOLE 1 MG PO TABS: 1.0000 mg | ORAL_TABLET | Freq: Every day | ORAL | 2 refills | Status: DC

## 2024-02-14 MED ORDER — IBANDRONATE SODIUM 150 MG PO TABS: 150.0000 mg | ORAL_TABLET | ORAL | 3 refills | Status: AC

## 2024-05-23 DIAGNOSIS — H43813 Vitreous degeneration, bilateral: Secondary | ICD-10-CM | POA: Diagnosis not present

## 2024-06-19 DIAGNOSIS — H18413 Arcus senilis, bilateral: Secondary | ICD-10-CM | POA: Diagnosis not present

## 2024-06-19 DIAGNOSIS — H26493 Other secondary cataract, bilateral: Secondary | ICD-10-CM | POA: Diagnosis not present

## 2024-06-19 DIAGNOSIS — H04123 Dry eye syndrome of bilateral lacrimal glands: Secondary | ICD-10-CM | POA: Diagnosis not present

## 2024-06-19 DIAGNOSIS — H26491 Other secondary cataract, right eye: Secondary | ICD-10-CM | POA: Diagnosis not present

## 2024-06-19 DIAGNOSIS — Z961 Presence of intraocular lens: Secondary | ICD-10-CM | POA: Diagnosis not present

## 2024-06-25 DIAGNOSIS — E559 Vitamin D deficiency, unspecified: Secondary | ICD-10-CM | POA: Diagnosis not present

## 2024-06-25 DIAGNOSIS — Z8673 Personal history of transient ischemic attack (TIA), and cerebral infarction without residual deficits: Secondary | ICD-10-CM | POA: Diagnosis not present

## 2024-06-25 DIAGNOSIS — E039 Hypothyroidism, unspecified: Secondary | ICD-10-CM | POA: Diagnosis not present

## 2024-06-25 DIAGNOSIS — N1831 Chronic kidney disease, stage 3a: Secondary | ICD-10-CM | POA: Diagnosis not present

## 2024-06-25 DIAGNOSIS — Z79899 Other long term (current) drug therapy: Secondary | ICD-10-CM | POA: Diagnosis not present

## 2024-06-25 DIAGNOSIS — M810 Age-related osteoporosis without current pathological fracture: Secondary | ICD-10-CM | POA: Diagnosis not present

## 2024-06-26 DIAGNOSIS — Z961 Presence of intraocular lens: Secondary | ICD-10-CM | POA: Diagnosis not present

## 2024-07-02 DIAGNOSIS — M810 Age-related osteoporosis without current pathological fracture: Secondary | ICD-10-CM | POA: Diagnosis not present

## 2024-07-02 DIAGNOSIS — E039 Hypothyroidism, unspecified: Secondary | ICD-10-CM | POA: Diagnosis not present

## 2024-07-02 DIAGNOSIS — N1831 Chronic kidney disease, stage 3a: Secondary | ICD-10-CM | POA: Diagnosis not present

## 2024-07-02 DIAGNOSIS — E785 Hyperlipidemia, unspecified: Secondary | ICD-10-CM | POA: Diagnosis not present

## 2024-07-02 DIAGNOSIS — Z23 Encounter for immunization: Secondary | ICD-10-CM | POA: Diagnosis not present

## 2024-07-02 DIAGNOSIS — Z79899 Other long term (current) drug therapy: Secondary | ICD-10-CM | POA: Diagnosis not present

## 2024-07-02 DIAGNOSIS — R7309 Other abnormal glucose: Secondary | ICD-10-CM | POA: Diagnosis not present

## 2024-07-02 DIAGNOSIS — R194 Change in bowel habit: Secondary | ICD-10-CM | POA: Diagnosis not present

## 2024-07-02 DIAGNOSIS — R195 Other fecal abnormalities: Secondary | ICD-10-CM | POA: Diagnosis not present

## 2024-07-02 DIAGNOSIS — G459 Transient cerebral ischemic attack, unspecified: Secondary | ICD-10-CM | POA: Diagnosis not present

## 2024-07-05 DIAGNOSIS — E739 Lactose intolerance, unspecified: Secondary | ICD-10-CM | POA: Diagnosis not present

## 2024-07-05 DIAGNOSIS — R194 Change in bowel habit: Secondary | ICD-10-CM | POA: Diagnosis not present

## 2024-07-05 DIAGNOSIS — R140 Abdominal distension (gaseous): Secondary | ICD-10-CM | POA: Diagnosis not present

## 2024-07-05 DIAGNOSIS — Z1211 Encounter for screening for malignant neoplasm of colon: Secondary | ICD-10-CM | POA: Diagnosis not present

## 2024-07-10 DIAGNOSIS — K6389 Other specified diseases of intestine: Secondary | ICD-10-CM | POA: Diagnosis not present

## 2024-07-10 DIAGNOSIS — Z1211 Encounter for screening for malignant neoplasm of colon: Secondary | ICD-10-CM | POA: Diagnosis not present

## 2024-07-10 DIAGNOSIS — K5730 Diverticulosis of large intestine without perforation or abscess without bleeding: Secondary | ICD-10-CM | POA: Diagnosis not present

## 2024-07-10 DIAGNOSIS — D123 Benign neoplasm of transverse colon: Secondary | ICD-10-CM | POA: Diagnosis not present

## 2024-08-21 ENCOUNTER — Encounter: Payer: Self-pay | Admitting: Hematology and Oncology

## 2024-08-29 ENCOUNTER — Inpatient Hospital Stay: Payer: Medicare Other | Attending: Hematology and Oncology | Admitting: Hematology and Oncology

## 2024-08-29 VITALS — BP 150/88 | HR 58 | Temp 97.2°F | Resp 16 | Wt 150.2 lb

## 2024-08-29 DIAGNOSIS — Z79811 Long term (current) use of aromatase inhibitors: Secondary | ICD-10-CM | POA: Diagnosis not present

## 2024-08-29 DIAGNOSIS — Z1721 Progesterone receptor positive status: Secondary | ICD-10-CM | POA: Diagnosis not present

## 2024-08-29 DIAGNOSIS — M1610 Unilateral primary osteoarthritis, unspecified hip: Secondary | ICD-10-CM | POA: Diagnosis not present

## 2024-08-29 DIAGNOSIS — C50211 Malignant neoplasm of upper-inner quadrant of right female breast: Secondary | ICD-10-CM | POA: Insufficient documentation

## 2024-08-29 DIAGNOSIS — Z1732 Human epidermal growth factor receptor 2 negative status: Secondary | ICD-10-CM | POA: Insufficient documentation

## 2024-08-29 DIAGNOSIS — Z170 Estrogen receptor positive status [ER+]: Secondary | ICD-10-CM | POA: Diagnosis not present

## 2024-08-29 NOTE — Assessment & Plan Note (Signed)
 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 N0 M0 stage IA: Pathologic stage TII, N0, M0 stage II A., 3.1 cm tumor ER/PR positive HER-2 negative, grade 3, perineural invasion present but no lymphovascular invasion. 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  Adj XRT completed 01/13/21   Current Treatment: Anastrozole   Anastrozole  Toxicities: Denies any hot flashes or arthralgias or myalgias.   Osteoporosis: On Boniva  and calcium  and vitamin D  Claustrophobia: Patient has severe anxiety and panic disorder when she is in close spaces.    Breast cancer surveillance: Breast exam done 08/29/2024: Benign Mammogram left breast at [facility] 08/16/2024: Benign breast density category C   RTC in in 1 year

## 2024-08-29 NOTE — Progress Notes (Signed)
 Patient Care Team: Gerome Brunet, DO as PCP - General (Family Medicine) Winfred Curlee DEL, MD (Inactive) as Referring Physician (Gynecology) Vanderbilt Ned, MD as Consulting Physician (General Surgery) Odean Potts, MD as Consulting Physician (Hematology and Oncology) Dewey Rush, MD as Consulting Physician (Radiation Oncology)  DIAGNOSIS:  Encounter Diagnosis  Name Primary?   Malignant neoplasm of upper-inner quadrant of right breast in female, estrogen receptor positive (HCC) Yes    SUMMARY OF ONCOLOGIC HISTORY: Oncology History  Breast cancer of upper-inner quadrant of right female breast (HCC)  06/12/2014 Mammogram   Right breast architectural distortion (dense breasts); Ultrasound 1.3 cm mass at 1:00 position additional masses behind this mass measuring 1.3 cm and 0.7 cm. These lumps were palpable   06/13/2014 Initial Diagnosis   Right breast: 1.3cm mass: Invasive mammary cancer probably ductal with mammary ca in situ ER 100% PR 80% gases on 30% HER-2 negative; posterior 1.3 cm mass: IDC ER 100% PR 90% gets some 20% HER-2 negative and 0.7 cm mass: DCIS   07/25/2014 Surgery   Right simple mastectomy: Invasive ductal carcinoma No LVI, Pos for PNI, 3.1 cm grade 3 with DCIS grade 3, ER 100%, PR 100%, HER-2/neu negative Ki-67 20% T2, N0, M0 stage II A., Oncotype DX recurrence over 4, ROR 4%   08/14/2014 - 09/2020 Anti-estrogen oral therapy   Tamoxifen  20 mg daily   08/24/2020 Relapse/Recurrence   Right chest wall postmastectomy skin erythema with slight palpable nodularity. 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 CT CAP and bone scan on 09/03/20 showed no evidence of metastases.   10/07/2020 Surgery   Resection of right chest mass ([facility]): poorly differentiated carcinoma, with positive inferior and deep margins.    10/2020 -  Anti-estrogen oral therapy   Switched to anastrozole  after resection of  recurrence      CHIEF COMPLIANT:   HISTORY OF PRESENT ILLNESS:  History of Present Illness Lydia Roberts is a 78 year old female who presents with hip and leg pain.  She has had progressive hip and leg pain for the past few months. She is taking anastrozole  and Crestor , started after a TIA two years ago, without prior issues on Crestor . She recalls similar pain previously while taking vandronate, but had not had this pain with her current medications until recently. She has osteoarthritis but has not had similar symptoms from it in the past. She has not started any new medications in the last three months.     ALLERGIES:  is allergic to elemental sulfur and codeine.  MEDICATIONS:  Current Outpatient Medications  Medication Sig Dispense Refill   anastrozole  (ARIMIDEX ) 1 MG tablet Take 1 tablet (1 mg total) by mouth daily. 100 tablet 2   aspirin  EC 81 MG tablet Take 1 tablet (81 mg total) by mouth daily. Swallow whole. 30 tablet 12   cholecalciferol  (VITAMIN D ) 1000 UNITS tablet Take 1,000 Units by mouth daily.     ibandronate  (BONIVA ) 150 MG tablet Take 1 tablet (150 mg total) by mouth every 30 (thirty) days. TAKE 1 TAB BY MOUTH MONTHLY ON  AN EMPTY STOMACH WITH FULL GLASS WATER 1/2 HOUR BEFORE FIRST FOOD DRINK OR MEDS. STAY UPRIGHT FOR  1/2 HOUR 3 tablet 3   levothyroxine  (SYNTHROID ) 88 MCG tablet Take 1 tablet (88 mcg total) by mouth daily at 6 (six) AM. 30 tablet 0   RESTASIS  0.05 % ophthalmic emulsion Place 1 drop into both eyes daily.  rosuvastatin  (CRESTOR ) 10 MG tablet Take 1 tablet (10 mg total) by mouth daily.     No current facility-administered medications for this visit.    PHYSICAL EXAMINATION: ECOG PERFORMANCE STATUS: 1 - Symptomatic but completely ambulatory  There were no vitals filed for this visit. There were no vitals filed for this visit.  Physical Exam GENERAL: Normal exam findings.  (exam performed in the presence of a chaperone)  LABORATORY DATA:  I  have reviewed the data as listed    Latest Ref Rng & Units 05/08/2023    6:39 AM 05/07/2023    3:31 PM 05/07/2023    1:19 PM  CMP  Glucose 70 - 99 mg/dL   895   BUN 8 - 23 mg/dL   14   Creatinine 9.55 - 1.00 mg/dL  9.11  9.01   Sodium 864 - 145 mmol/L   135   Potassium 3.5 - 5.1 mmol/L   4.2   Chloride 98 - 111 mmol/L   98   CO2 22 - 32 mmol/L   25   Calcium  8.7 - 10.3 mg/dL 8.9   9.5   Total Protein 6.5 - 8.1 g/dL   6.9   Total Bilirubin 0.3 - 1.2 mg/dL   1.3   Alkaline Phos 38 - 126 U/L   45   AST 15 - 41 U/L   25   ALT 0 - 44 U/L   15     Lab Results  Component Value Date   WBC 3.6 (L) 05/07/2023   HGB 11.0 (L) 05/07/2023   HCT 35.3 (L) 05/07/2023   MCV 90.3 05/07/2023   PLT 161 05/07/2023   NEUTROABS 2.6 05/07/2023    ASSESSMENT & PLAN:  Breast cancer of upper-inner quadrant of right female breast (HCC) 07/25/2014: Right breast invasive ductal carcinoma with DCIS status post mastectomy, clinical stage TI C. N0 M0 stage IA: Pathologic stage TII, N0, M0 stage II A., 3.1 cm tumor ER/PR positive HER-2 negative, grade 3, perineural invasion present but no lymphovascular invasion. 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  Adj XRT completed 01/13/21   Current Treatment: Anastrozole   Anastrozole  Toxicities: Denies any hot flashes or arthralgias or myalgias.   Osteoporosis: On Boniva  and calcium  and vitamin D  Claustrophobia: Patient has severe anxiety and panic disorder when she is in close spaces.    Breast cancer  surveillance: Breast exam done 08/29/2024: Benign Mammogram left breast at [facility] 08/16/2024: Benign breast density category C   RTC in in 1 year ------------------------------------- Assessment and Plan Assessment & Plan Estrogen receptor positive breast cancer, status post treatment with anastrozole  Currently on anastrozole  without adherence issues. No new chest masses. Hip and leg pain may be due to anastrozole  or rosuvastatin . - Continue anastrozole . - Consider stopping anastrozole  for two weeks if pain persists after stopping rosuvastatin .  Osteoarthritis with new onset hip and leg pain, possible medication-related etiology New hip and leg pain possibly linked to rosuvastatin  or anastrozole . Differential includes osteoarthritis. - Stop rosuvastatin  for two weeks to assess pain resolution. - If pain persists, stop anastrozole  for two weeks. -  Contact healthcare provider if pain resolves to discuss alternative medications.      No orders of the defined types were placed in this encounter.  The patient has a good understanding of the overall plan. she agrees with it. she will call with any problems that may develop before the next visit here.  I personally spent a total of 30 minutes in the care of the patient today including preparing to see the patient, getting/reviewing separately obtained history, performing a medically appropriate exam/evaluation, counseling and educating, placing orders, referring and communicating with other health care professionals, documenting clinical information in the EHR, independently interpreting results, communicating results, and coordinating care.   Viinay K Chan Sheahan, MD 08/29/24

## 2024-10-30 ENCOUNTER — Other Ambulatory Visit: Payer: Self-pay | Admitting: Hematology and Oncology

## 2025-08-28 ENCOUNTER — Inpatient Hospital Stay: Admitting: Hematology and Oncology
